# Patient Record
Sex: Female | Born: 1985
Health system: Southern US, Community
[De-identification: ages and names within clinical notes are randomized; demographics above are authoritative.]

## PROBLEM LIST (undated history)

## (undated) ENCOUNTER — Inpatient Hospital Stay (HOSPITAL_COMMUNITY): Payer: Self-pay

## (undated) DIAGNOSIS — O09529 Supervision of elderly multigravida, unspecified trimester: Secondary | ICD-10-CM

## (undated) DIAGNOSIS — F329 Major depressive disorder, single episode, unspecified: Secondary | ICD-10-CM

## (undated) DIAGNOSIS — Z973 Presence of spectacles and contact lenses: Secondary | ICD-10-CM

## (undated) DIAGNOSIS — J302 Other seasonal allergic rhinitis: Secondary | ICD-10-CM

## (undated) DIAGNOSIS — N883 Incompetence of cervix uteri: Secondary | ICD-10-CM

## (undated) DIAGNOSIS — J45990 Exercise induced bronchospasm: Secondary | ICD-10-CM

## (undated) DIAGNOSIS — Z87448 Personal history of other diseases of urinary system: Secondary | ICD-10-CM

## (undated) DIAGNOSIS — J45909 Unspecified asthma, uncomplicated: Secondary | ICD-10-CM

## (undated) DIAGNOSIS — E282 Polycystic ovarian syndrome: Secondary | ICD-10-CM

## (undated) DIAGNOSIS — N390 Urinary tract infection, site not specified: Secondary | ICD-10-CM

## (undated) DIAGNOSIS — N10 Acute tubulo-interstitial nephritis: Secondary | ICD-10-CM

## (undated) DIAGNOSIS — F419 Anxiety disorder, unspecified: Secondary | ICD-10-CM

## (undated) DIAGNOSIS — K429 Umbilical hernia without obstruction or gangrene: Secondary | ICD-10-CM

## (undated) DIAGNOSIS — F32A Depression, unspecified: Secondary | ICD-10-CM

## (undated) HISTORY — PX: WISDOM TOOTH EXTRACTION: SHX21

## (undated) HISTORY — DX: Anxiety disorder, unspecified: F41.9

---

## 2011-04-14 ENCOUNTER — Emergency Department (HOSPITAL_COMMUNITY)
Admission: EM | Admit: 2011-04-14 | Discharge: 2011-04-14 | Disposition: A | Discharge status: Home / Self Care | Payer: 59 | Source: Home / Self Care | Attending: Emergency Medicine | Admitting: Emergency Medicine

## 2011-04-14 DIAGNOSIS — N390 Urinary tract infection, site not specified: Secondary | ICD-10-CM

## 2011-04-14 HISTORY — DX: Acute pyelonephritis: N10

## 2011-04-14 HISTORY — DX: Urinary tract infection, site not specified: N39.0

## 2011-04-14 LAB — POCT URINALYSIS DIP (DEVICE)
Bilirubin Urine: NEGATIVE
Hgb urine dipstick: NEGATIVE
Nitrite: NEGATIVE
pH: 6.5 (ref 5.0–8.0)

## 2011-04-14 MED ORDER — PHENAZOPYRIDINE HCL 200 MG PO TABS: 200.0000 mg | ORAL_TABLET | Freq: Three times a day (TID) | ORAL | Status: AC | PRN

## 2011-04-14 MED ORDER — SULFAMETHOXAZOLE-TRIMETHOPRIM 800-160 MG PO TABS: 1.0000 | ORAL_TABLET | Freq: Two times a day (BID) | ORAL | Status: AC

## 2011-04-14 NOTE — ED Notes (Signed)
Patient denies vaginal discharge, odor or irritation

## 2011-04-14 NOTE — ED Provider Notes (Signed)
History     CSN: [id number] Arrival date & time: 04/14/2011 11:52 AM   First MD Initiated Contact with Patient 04/14/11 1113      Chief Complaint  Patient presents with  . Urinary Frequency  . Urinary Retention  . Dysuria   HPI Comments: Pt with 3 weeks of urinary urgency, frequency, dysuria. No oderous urine, hematuria,  genital blisters, vaginal itching. No fevers, N/V, abd pain, back pain. No recent abx use. Pt sexually active with same female partner who is asxatic. does not use protection. STD's not a concern today. Similar sx before when had UTI.  No h/o syphilis, herpes, HIV, BV/ gonorrhea/chlamydia/trichmonoas.   Patient is a 25 y.o. female presenting with frequency and dysuria. The history is provided by the patient.  Urinary Frequency This is a recurrent problem. The current episode started more than 1 week ago. The problem has not changed since onset.Pertinent negatives include no abdominal pain. The symptoms are aggravated by nothing. The symptoms are relieved by nothing. Treatments tried: drinking extra fluids. The treatment provided mild relief.  Dysuria  Associated symptoms include frequency and urgency. Pertinent negatives include no nausea, no vomiting and no hematuria.    Past Medical History  Diagnosis Date  . UTI (urinary tract infection)     last one was in 2007  . Pyelonephritis, acute     History reviewed. No pertinent past surgical history.  Family History  Problem Relation Age of Onset  . Diabetes Mother   . Diabetes Other     History  Substance Use Topics  . Smoking status: Never Smoker   . Smokeless tobacco: Never Used  . Alcohol Use: Yes     social    OB History    Grav Para Term Preterm Abortions TAB SAB Ect Mult Living                  Review of Systems  Constitutional: Negative for fever.  Gastrointestinal: Negative for nausea, vomiting and abdominal pain.  Genitourinary: Positive for dysuria, urgency and frequency. Negative for  hematuria, vaginal bleeding, vaginal discharge, difficulty urinating, genital sores, vaginal pain and pelvic pain.  Musculoskeletal: Negative for back pain.  Skin: Negative for rash.    Allergies  Tree extract  Home Medications   Current Outpatient Rx  Name Route Sig Dispense Refill  . CETIRIZINE HCL 10 MG PO TABS Oral Take 10 mg by mouth daily.      Marland Kitchen VITAMIN D3 50000 UNITS PO CAPS Oral Take by mouth 1 day or 1 dose.      Marland Kitchen DESOGESTREL-ETHINYL ESTRADIOL 0.15-30 MG-MCG PO TABS Oral Take 1 tablet by mouth daily.      . MULTI-VITAMIN DAILY PO Oral Take by mouth 1 day or 1 dose.      Marland Kitchen PHENAZOPYRIDINE HCL 200 MG PO TABS Oral Take 1 tablet (200 mg total) by mouth 3 (three) times daily as needed for pain. 6 tablet 0  . SULFAMETHOXAZOLE-TRIMETHOPRIM 800-160 MG PO TABS Oral Take 1 tablet by mouth 2 (two) times daily. 6 tablet 0    BP 126/76  Pulse 62  Temp(Src) 98.8 F (37.1 C) (Oral)  Resp 14  SpO2 100%  LMP 02/06/2011  Physical Exam  Nursing note and vitals reviewed. Constitutional: She is oriented to person, place, and time. She appears well-developed and well-nourished. No distress.  HENT:  Head: Normocephalic and atraumatic.  Eyes: EOM are normal. Pupils are equal, round, and reactive to light.  Neck: Normal range of motion.  Cardiovascular: Regular rhythm.   Pulmonary/Chest: Effort normal and breath sounds normal.  Abdominal: Soft. Bowel sounds are normal. She exhibits no distension and no mass. There is no tenderness. There is no rebound and no guarding.       No cva tenderness  Genitourinary:       Pt declined pelvic  Musculoskeletal: Normal range of motion.  Neurological: She is alert and oriented to person, place, and time.  Skin: Skin is warm and dry.  Psychiatric: She has a normal mood and affect. Her behavior is normal. Judgment and thought content normal.    ED Course  Procedures (including critical care time)   Labs Reviewed  POCT URINALYSIS DIP (DEVICE)    POCT PREGNANCY, URINE  POCT PREGNANCY, URINE  POCT URINALYSIS DIPSTICK  URINE CULTURE   Results for orders placed during the hospital encounter of 04/14/11  POCT URINALYSIS DIP (DEVICE)      Component Value Range   Glucose, UA NEGATIVE  NEGATIVE (mg/dL)   Bilirubin Urine NEGATIVE  NEGATIVE    Ketones, ur NEGATIVE  NEGATIVE (mg/dL)   Specific Gravity, Urine 1.010  1.005 - 1.030    Hgb urine dipstick NEGATIVE  NEGATIVE    pH 6.5  5.0 - 8.0    Protein, ur NEGATIVE  NEGATIVE (mg/dL)   Urobilinogen, UA 0.2  0.0 - 1.0 (mg/dL)   Nitrite NEGATIVE  NEGATIVE    Leukocytes, UA NEGATIVE  NEGATIVE   POCT PREGNANCY, URINE      Component Value Range   Preg Test, Ur NEGATIVE       1. UTI (lower urinary tract infection)       MDM  udip neg here will send off for cx and tx for uti empirically as has had identical sx with prev uti's. STD's not a concern today per pt who declined pelvic.    Luiz Blare, MD 04/14/11 [phone]

## 2011-04-14 NOTE — ED Notes (Signed)
Patient complains of Dysuria, urinary frequency and voiding in small amounts x 3 weeks;  Pt states the symptoms have not been constant.  Pt states symptoms improve with increased water intake

## 2011-04-16 LAB — URINE CULTURE
Culture  Setup Time: 201211182217
Culture: NO GROWTH

## 2011-07-03 ENCOUNTER — Encounter (HOSPITAL_COMMUNITY): Payer: Self-pay | Admitting: *Deleted

## 2011-07-03 ENCOUNTER — Emergency Department (INDEPENDENT_AMBULATORY_CARE_PROVIDER_SITE_OTHER)
Admission: EM | Admit: 2011-07-03 | Discharge: 2011-07-03 | Disposition: A | Discharge status: Home / Self Care | Payer: 59 | Source: Home / Self Care | Attending: Family Medicine | Admitting: Family Medicine

## 2011-07-03 DIAGNOSIS — J029 Acute pharyngitis, unspecified: Secondary | ICD-10-CM

## 2011-07-03 HISTORY — DX: Other seasonal allergic rhinitis: J30.2

## 2011-07-03 NOTE — ED Notes (Signed)
Pt c/o sore throat onset Monday.  Denies fever.  Throat slightly red and swollen, no exudate noted.  Denies ear pain.

## 2011-07-03 NOTE — ED Provider Notes (Signed)
History     CSN: [id number]  Arrival date & time 07/03/11  0811   First MD Initiated Contact with Patient 07/03/11 [phone]      Chief Complaint  Patient presents with  . Sore Throat    (Consider location/radiation/quality/duration/timing/severity/associated sxs/prior treatment) HPI Comments: Kimberly Delgado presents for evaluation of 2 days of sore throat. She reports onset of symptoms yesterday that worsened through today. She reports some sneezing, but no coughing, and no fever. She states that she just wanted to get checked out to make sure that this was not strep pharyngitis.  Patient is a 26 y.o. female presenting with pharyngitis. The history is provided by the patient.  Sore Throat This is a new problem. The current episode started yesterday. The problem occurs constantly. The problem has not changed since onset.The symptoms are aggravated by swallowing. The symptoms are relieved by nothing.    Past Medical History  Diagnosis Date  . UTI (urinary tract infection)     last one was in 2007  . Pyelonephritis, acute   . Seasonal allergies     Past Surgical History  Procedure Date  . Tonsillectomy     Family History  Problem Relation Age of Onset  . Diabetes Mother   . Diabetes Other     History  Substance Use Topics  . Smoking status: Never Smoker   . Smokeless tobacco: Never Used  . Alcohol Use: Yes     social    OB History    Grav Para Term Preterm Abortions TAB SAB Ect Mult Living                  Review of Systems  Constitutional: Negative.  Negative for fever and chills.  HENT: Positive for sore throat and sneezing.   Eyes: Negative.   Respiratory: Negative.  Negative for cough.   Cardiovascular: Negative.   Gastrointestinal: Negative.   Genitourinary: Negative.   Musculoskeletal: Negative.   Skin: Negative.   Neurological: Negative.     Allergies  Tree extract  Home Medications   Current Outpatient Rx  Name Route Sig Dispense Refill  .  ALBUTEROL SULFATE (2.5 MG/3ML) 0.083% IN NEBU Nebulization Take 2.5 mg by nebulization every 6 (six) hours as needed.    Marland Kitchen CETIRIZINE HCL 10 MG PO TABS Oral Take 10 mg by mouth daily.      Marland Kitchen VITAMIN D3 50000 UNITS PO CAPS Oral Take by mouth 1 day or 1 dose.      Marland Kitchen RECLIPSEN PO Oral Take 1 tablet by mouth 1 day or 1 dose.    . MOMETASONE FUROATE 50 MCG/ACT NA SUSP Nasal Place 2 sprays into the nose daily.    Marland Kitchen SINGULAIR PO Oral Take 1 tablet by mouth 1 day or 1 dose.    . MULTI-VITAMIN DAILY PO Oral Take by mouth 1 day or 1 dose.      Marland Kitchen DESOGESTREL-ETHINYL ESTRADIOL 0.15-30 MG-MCG PO TABS Oral Take 1 tablet by mouth daily.       BP 105/76  Pulse 88  Temp(Src) 98.6 F (37 C) (Oral)  Resp 14  SpO2 98%  LMP 06/24/2011  Physical Exam  Constitutional: She is oriented to person, place, and time. She appears well-developed and well-nourished.  HENT:  Head: Normocephalic and atraumatic.  Right Ear: Tympanic membrane and external ear normal.  Left Ear: Tympanic membrane and external ear normal.  Nose: Nose normal.  Mouth/Throat: Uvula is midline, oropharynx is clear and moist and mucous membranes are normal.  No oropharyngeal exudate, posterior oropharyngeal edema or posterior oropharyngeal erythema.  Eyes: EOM are normal. Pupils are equal, round, and reactive to light.  Neck: Normal range of motion.  Cardiovascular: Normal rate and regular rhythm.   Pulmonary/Chest: Effort normal and breath sounds normal.  Lymphadenopathy:    She has cervical adenopathy.       Right cervical: Superficial cervical and posterior cervical adenopathy present.       Left cervical: Superficial cervical and posterior cervical adenopathy present.  Neurological: She is alert and oriented to person, place, and time.  Skin: Skin is warm and dry.    ED Course  Procedures (including critical care time)  Labs Reviewed - No data to display No results found.   1. Pharyngitis       MDM  Supportive care with  ibuprofen and acetaminophen        Richardo Priest, MD 07/03/11 [phone]

## 2011-09-13 ENCOUNTER — Encounter (HOSPITAL_COMMUNITY): Payer: Self-pay | Admitting: Emergency Medicine

## 2011-09-13 ENCOUNTER — Emergency Department (HOSPITAL_COMMUNITY)
Admission: EM | Admit: 2011-09-13 | Discharge: 2011-09-13 | Disposition: A | Discharge status: Home / Self Care | Payer: 59 | Attending: Emergency Medicine | Admitting: Emergency Medicine

## 2011-09-13 DIAGNOSIS — R300 Dysuria: Secondary | ICD-10-CM | POA: Insufficient documentation

## 2011-09-13 DIAGNOSIS — N390 Urinary tract infection, site not specified: Secondary | ICD-10-CM | POA: Insufficient documentation

## 2011-09-13 LAB — URINALYSIS, ROUTINE W REFLEX MICROSCOPIC
Bilirubin Urine: NEGATIVE
Ketones, ur: NEGATIVE mg/dL
Nitrite: NEGATIVE
Urobilinogen, UA: 0.2 mg/dL (ref 0.0–1.0)

## 2011-09-13 LAB — PREGNANCY, URINE: Preg Test, Ur: NEGATIVE

## 2011-09-13 MED ORDER — PHENAZOPYRIDINE HCL 200 MG PO TABS: 200.0000 mg | ORAL_TABLET | Freq: Three times a day (TID) | ORAL | Status: AC

## 2011-09-13 MED ORDER — HYDROCODONE-ACETAMINOPHEN 5-325 MG PO TABS: 1.0000 | ORAL_TABLET | ORAL | Status: AC | PRN

## 2011-09-13 MED ORDER — NITROFURANTOIN MONOHYD MACRO 100 MG PO CAPS: 100.0000 mg | ORAL_CAPSULE | Freq: Two times a day (BID) | ORAL | Status: AC

## 2011-09-13 NOTE — ED Notes (Signed)
Pt presented to the ER with c/o uretral pain and burning, pt reports that sharp pain started yesterday and this night was woken up by pain and when went to urinate blood noted. Pt also reports Hx of UTI's since October.

## 2011-09-13 NOTE — ED Provider Notes (Signed)
History     CSN: [id number]  Arrival date & time 09/13/11  0106   First MD Initiated Contact with Patient 09/13/11 0255      Chief Complaint  Patient presents with  . Urinary Tract Infection    HPI: Patient is a 26 y.o. female presenting with urinary tract infection. The history is provided by the patient.  Urinary Tract Infection This is a recurrent problem. The current episode started in the past 7 days. The problem occurs intermittently. The problem has been rapidly worsening. Pertinent negatives include no chills, fever, nausea or vomiting. She has tried nothing for the symptoms.  Pt reports onset of dysuria this past Wed that has worsened. Pt has been seeing urology since Nov for persistent dysuria but states they are never able to find an infection and have been unsure what has been causing her dysuria. States symptoms often occur after intercourse but not always. Tonight was awakened by sharp pains in her urethra and had severe pain when she attempted to void. Denies vag d/c or other GYN c/o's and states she has an appointment w/ her PCP 09/19/2011 for PAP and annual exam.   Past Medical History  Diagnosis Date  . UTI (urinary tract infection)     last one was in 2007  . Pyelonephritis, acute   . Seasonal allergies     Past Surgical History  Procedure Date  . Tonsillectomy     Family History  Problem Relation Age of Onset  . Diabetes Mother   . Diabetes Other     History  Substance Use Topics  . Smoking status: Never Smoker   . Smokeless tobacco: Never Used  . Alcohol Use: Yes     social    OB History    Grav Para Term Preterm Abortions TAB SAB Ect Mult Living                  Review of Systems  Constitutional: Negative.  Negative for fever and chills.  HENT: Negative.   Eyes: Negative.   Respiratory: Negative.   Cardiovascular: Negative.   Gastrointestinal: Negative.  Negative for nausea and vomiting.  Genitourinary: Positive for dysuria and  frequency. Negative for flank pain, vaginal discharge, difficulty urinating, vaginal pain and dyspareunia.  Musculoskeletal: Negative.   Neurological: Negative.   Hematological: Negative.   Psychiatric/Behavioral: Negative.     Allergies  Tree extract  Home Medications   Current Outpatient Rx  Name Route Sig Dispense Refill  . AZELASTINE-FLUTICASONE 137-50 MCG/ACT NA SUSP Nasal Place 1 spray into the nose daily.    Marland Kitchen CRANBERRY 400 MG PO CAPS Oral Take 400 mg by mouth daily.    Marland Kitchen DARIFENACIN HYDROBROMIDE ER 15 MG PO TB24 Oral Take 15 mg by mouth daily.    Marland Kitchen RECLIPSEN PO Oral Take 1 tablet by mouth 1 day or 1 dose.    Marland Kitchen MONTELUKAST SODIUM 10 MG PO TABS Oral Take 10 mg by mouth at bedtime.    . ADULT MULTIVITAMIN W/MINERALS CH Oral Take 1 tablet by mouth daily.      BP 109/68  Pulse 65  Temp(Src) 98.5 F (36.9 C) (Oral)  Resp 20  SpO2 100%  LMP 09/04/2011  Physical Exam  Constitutional: She is oriented to person, place, and time. She appears well-developed and well-nourished.  HENT:  Head: Normocephalic and atraumatic.  Eyes: Conjunctivae are normal.  Neck: Normal range of motion.  Cardiovascular: Normal rate.   Pulmonary/Chest: Effort normal.  Musculoskeletal: Normal range of  motion.  Neurological: She is alert and oriented to person, place, and time.  Skin: Skin is warm and dry.  Psychiatric: She has a normal mood and affect.    ED Course  Procedures (including critical care time)  Labs Reviewed  URINALYSIS, ROUTINE W REFLEX MICROSCOPIC - Abnormal; Notable for the following:    APPearance CLOUDY (*)    Hgb urine dipstick LARGE (*)    Protein, ur 100 (*)    Leukocytes, UA LARGE (*)    All other components within normal limits  URINE MICROSCOPIC-ADD ON - Abnormal; Notable for the following:    Bacteria, UA FEW (*)    All other components within normal limits   No results found.   No diagnosis found.    MDM  HPI/PE and clinical findings c/w 1. UTI  (Denies vag d/c or other GYN c/o's. Scheduled for annual GYN exam next week. Encouraged to also f/u w/ her urologist as well).        Roma Kayser Nycole Kawahara, NP 09/13/11 [phone]

## 2011-09-13 NOTE — Discharge Instructions (Signed)
Please read over the instructions below. We are treating you for a urinary tract infection (UTI). Take the antibiotic as directed and be sure to complete. Take the pyridium as directed for pain  with urination and the narcotic pain medication for more severe pain. Keep scheduled appointment with Dr Parke Simmers next week for your annual GYN screening exam and be sure she is aware that you have recently been treated for a UTI as she will want to recheck your urine to assure the infection has cleared.     Urinary Tract Infection A urinary tract infection (UTI) is often caused by a germ (bacteria). A UTI is usually helped with medicine (antibiotics) that kills germs. Take all the medicine until it is gone. Do this even if you are feeling better. You are usually better in 7 to 10 days. HOME CARE   Drink enough water and fluids to keep your pee (urine) clear or pale yellow. Drink:   Cranberry juice.   Water.   Avoid:   Caffeine.   Tea.   Bubbly (carbonated) drinks.   Alcohol.   Only take medicine as told by your doctor.   To prevent further infections:   Pee often.   After pooping (bowel movement), women should wipe from front to back. Use each tissue only once.   Pee before and after having sex (intercourse).  Ask your doctor when your test results will be ready. Make sure you follow up and get your test results.  GET HELP RIGHT AWAY IF:   There is very bad back pain or lower belly (abdominal) pain.   You get the chills.   You have a fever.   Your baby is older than 3 months with a rectal temperature of 102 F (38.9 C) or higher.   Your baby is 71 months old or younger with a rectal temperature of 100.4 F (38 C) or higher.   You feel sick to your stomach (nauseous) or throw up (vomit).   There is continued burning with peeing.   Your problems are not better in 3 days. Return sooner if you are getting worse.  MAKE SURE YOU:   Understand these instructions.   Will watch  your condition.   Will get help right away if you are not doing well or get worse.  Document Released: 10/30/2007 Document Revised: 05/02/2011 Document Reviewed: 10/30/2007 [facility] Patient Information 2012 [place], [place].

## 2011-09-13 NOTE — ED Notes (Signed)
PA Schorr at bedside. 

## 2011-09-13 NOTE — ED Provider Notes (Signed)
Medical screening examination/treatment/procedure(s) were performed by non-physician practitioner and as supervising physician I was immediately available for consultation/collaboration.  Olivia Mackie, MD 09/13/11 [phone]

## 2011-09-16 ENCOUNTER — Emergency Department (HOSPITAL_COMMUNITY)
Admission: EM | Admit: 2011-09-16 | Discharge: 2011-09-16 | Disposition: A | Discharge status: Home / Self Care | Payer: 59 | Source: Home / Self Care | Attending: Family Medicine | Admitting: Family Medicine

## 2011-09-16 ENCOUNTER — Encounter (HOSPITAL_COMMUNITY): Payer: Self-pay

## 2011-09-16 DIAGNOSIS — N390 Urinary tract infection, site not specified: Secondary | ICD-10-CM

## 2011-09-16 LAB — POCT URINALYSIS DIP (DEVICE)
Hgb urine dipstick: NEGATIVE
Ketones, ur: NEGATIVE mg/dL
Protein, ur: NEGATIVE mg/dL
Specific Gravity, Urine: 1.02 (ref 1.005–1.030)
pH: 6.5 (ref 5.0–8.0)

## 2011-09-16 LAB — POCT PREGNANCY, URINE: Preg Test, Ur: NEGATIVE

## 2011-09-16 NOTE — Discharge Instructions (Signed)
Finish your antibiotic and see your urologist as planned.

## 2011-09-16 NOTE — ED Provider Notes (Addendum)
History     CSN: [id number]  Arrival date & time 09/16/11  1947   First MD Initiated Contact with Patient 09/16/11 1956      Chief Complaint  Patient presents with  . Urinary Tract Infection    (Consider location/radiation/quality/duration/timing/severity/associated sxs/prior treatment) Patient is a 26 y.o. female presenting with frequency. The history is provided by the patient and a friend.  Urinary Frequency This is a chronic problem. The current episode started more than 2 days ago. The problem has not changed (pt c/o bilat back pain persistent from ER visit 4/19, no fever n/v  or chills., on macrobid and vicodin from ER but feels not improving.) since onset.   Past Medical History  Diagnosis Date  . UTI (urinary tract infection)     last one was in 2007  . Pyelonephritis, acute   . Seasonal allergies   . Overactive bladder     Past Surgical History  Procedure Date  . Tonsillectomy     Family History  Problem Relation Age of Onset  . Diabetes Mother   . Diabetes Other     History  Substance Use Topics  . Smoking status: Never Smoker   . Smokeless tobacco: Never Used  . Alcohol Use: Yes     social    OB History    Grav Para Term Preterm Abortions TAB SAB Ect Mult Living                  Review of Systems  Constitutional: Negative for fever and chills.  Gastrointestinal: Negative.   Genitourinary: Positive for frequency.  Musculoskeletal: Positive for back pain.    Allergies  Tree extract  Home Medications   Current Outpatient Rx  Name Route Sig Dispense Refill  . AZELASTINE-FLUTICASONE 137-50 MCG/ACT NA SUSP Nasal Place 1 spray into the nose daily.    Marland Kitchen DARIFENACIN HYDROBROMIDE ER 15 MG PO TB24 Oral Take 15 mg by mouth daily.    Marland Kitchen RECLIPSEN PO Oral Take 1 tablet by mouth 1 day or 1 dose.    Marland Kitchen HYDROCODONE-ACETAMINOPHEN 5-325 MG PO TABS Oral Take 1 tablet by mouth every 4 (four) hours as needed for pain. 10 tablet 0  . MONTELUKAST SODIUM 10 MG  PO TABS Oral Take 10 mg by mouth at bedtime.    Marland Kitchen NITROFURANTOIN MONOHYD MACRO 100 MG PO CAPS Oral Take 1 capsule (100 mg total) by mouth 2 (two) times daily. 14 capsule 0  . PHENAZOPYRIDINE HCL 200 MG PO TABS Oral Take 1 tablet (200 mg total) by mouth 3 (three) times daily. 6 tablet 0  . CRANBERRY 400 MG PO CAPS Oral Take 400 mg by mouth daily.    . ADULT MULTIVITAMIN W/MINERALS CH Oral Take 1 tablet by mouth daily.      BP 113/64  Pulse 71  Temp(Src) 97.9 F (36.6 C) (Oral)  Resp 16  SpO2 97%  LMP 09/04/2011  Physical Exam  Nursing note and vitals reviewed. Constitutional: She is oriented to person, place, and time. She appears well-developed and well-nourished.  Abdominal: Soft. Bowel sounds are normal. There is no CVA tenderness.  Musculoskeletal:       Lumbar back: She exhibits tenderness.  Neurological: She is alert and oriented to person, place, and time.  Skin: Skin is warm and dry.    ED Course  Procedures (including critical care time)   Labs Reviewed  POCT URINALYSIS DIP (DEVICE)  POCT PREGNANCY, URINE  URINE CULTURE   No results found.  1. UTI (lower urinary tract infection)       MDM  Pt and friend became argumentative and unreceptive to discussion that u/a was clear , indicating that infection was resolving, , pt obviously frustrated by problem present since oct and no one can figure out why she is hurting, my back still hurts and I think I still have infection in my kidneys. Pt advised that we would get a urine culture and that otherwise we were limited in doing anything else to eval the problem and she needs to see her urologist, pt unsatisfied with visit and not reassured.        Linna Hoff, MD 09/16/11 2117  Linna Hoff, MD 09/18/11 2108

## 2011-09-16 NOTE — ED Notes (Signed)
Pt seen in ED on Thursday and started on antibiotics for UTI.  Here tonight because she continues to have low back pain, states the urinary frequency has improved.  Pt has hx of urinary problems and sees a urologist; is being treated for overactive bladder.

## 2011-09-17 LAB — URINE CULTURE
Colony Count: NO GROWTH
Culture  Setup Time: 201304222238

## 2011-09-19 ENCOUNTER — Other Ambulatory Visit (HOSPITAL_COMMUNITY)
Admission: RE | Admit: 2011-09-19 | Discharge: 2011-09-19 | Disposition: A | Discharge status: Home / Self Care | Payer: 59 | Source: Ambulatory Visit | Attending: Family Medicine | Admitting: Family Medicine

## 2011-09-19 ENCOUNTER — Other Ambulatory Visit: Payer: Self-pay | Admitting: Family Medicine

## 2011-09-19 DIAGNOSIS — Z01419 Encounter for gynecological examination (general) (routine) without abnormal findings: Secondary | ICD-10-CM | POA: Insufficient documentation

## 2012-02-29 ENCOUNTER — Ambulatory Visit (INDEPENDENT_AMBULATORY_CARE_PROVIDER_SITE_OTHER): Payer: 59 | Admitting: Family Medicine

## 2012-02-29 VITALS — BP 105/72 | HR 80 | Temp 98.2°F | Resp 16 | Ht 67.0 in | Wt 134.0 lb

## 2012-02-29 DIAGNOSIS — N390 Urinary tract infection, site not specified: Secondary | ICD-10-CM

## 2012-02-29 DIAGNOSIS — R300 Dysuria: Secondary | ICD-10-CM

## 2012-02-29 LAB — POCT UA - MICROSCOPIC ONLY
Casts, Ur, LPF, POC: NEGATIVE
Crystals, Ur, HPF, POC: NEGATIVE
Epithelial cells, urine per micros: NEGATIVE
Mucus, UA: NEGATIVE
Yeast, UA: NEGATIVE

## 2012-02-29 LAB — POCT URINALYSIS DIPSTICK
Bilirubin, UA: NEGATIVE
Glucose, UA: NEGATIVE
Ketones, UA: NEGATIVE
Nitrite, UA: NEGATIVE
Spec Grav, UA: 1.01
Urobilinogen, UA: 0.2
pH, UA: 6

## 2012-02-29 MED ORDER — CIPROFLOXACIN HCL 500 MG PO TABS: 500.0000 mg | ORAL_TABLET | Freq: Two times a day (BID) | ORAL | Status: DC

## 2012-02-29 NOTE — Patient Instructions (Signed)
Urinary Tract Infection Urinary tract infections (UTIs) can develop anywhere along your urinary tract. Your urinary tract is your body's drainage system for removing wastes and extra water. Your urinary tract includes two kidneys, two ureters, a bladder, and a urethra. Your kidneys are a pair of bean-shaped organs. Each kidney is about the size of your fist. They are located below your ribs, one on each side of your spine. CAUSES Infections are caused by microbes, which are microscopic organisms, including fungi, viruses, and bacteria. These organisms are so small that they can only be seen through a microscope. Bacteria are the microbes that most commonly cause UTIs. SYMPTOMS  Symptoms of UTIs may vary by age and gender of the patient and by the location of the infection. Symptoms in young women typically include a frequent and intense urge to urinate and a painful, burning feeling in the bladder or urethra during urination. Older women and men are more likely to be tired, shaky, and weak and have muscle aches and abdominal pain. A fever may mean the infection is in your kidneys. Other symptoms of a kidney infection include pain in your back or sides below the ribs, nausea, and vomiting. DIAGNOSIS To diagnose a UTI, your caregiver will ask you about your symptoms. Your caregiver also will ask to provide a urine sample. The urine sample will be tested for bacteria and white blood cells. White blood cells are made by your body to help fight infection. TREATMENT  Typically, UTIs can be treated with medication. Because most UTIs are caused by a bacterial infection, they usually can be treated with the use of antibiotics. The choice of antibiotic and length of treatment depend on your symptoms and the type of bacteria causing your infection. HOME CARE INSTRUCTIONS  If you were prescribed antibiotics, take them exactly as your caregiver instructs you. Finish the medication even if you feel better after you  have only taken some of the medication.  Drink enough water and fluids to keep your urine clear or pale yellow.  Avoid caffeine, tea, and carbonated beverages. They tend to irritate your bladder.  Empty your bladder often. Avoid holding urine for long periods of time.  Empty your bladder before and after sexual intercourse.  After a bowel movement, women should cleanse from front to back. Use each tissue only once. SEEK MEDICAL CARE IF:   You have back pain.  You develop a fever.  Your symptoms do not begin to resolve within 3 days. SEEK IMMEDIATE MEDICAL CARE IF:   You have severe back pain or lower abdominal pain.  You develop chills.  You have nausea or vomiting.  You have continued burning or discomfort with urination. MAKE SURE YOU:   Understand these instructions.  Will watch your condition.  Will get help right away if you are not doing well or get worse. Document Released: 02/20/2005 Document Revised: 11/12/2011 Document Reviewed: 06/21/2011 [employer] Patient Information 2013 [employer].  

## 2012-02-29 NOTE — Progress Notes (Signed)
26 yo with acute onset of UTI this morning:  Dysuria and hematuria.  She gets UTI's more than twice a year Has had urology evaluation  Objective:  NAD No CVAT  Results for orders placed during the hospital encounter of 09/16/11  POCT URINALYSIS DIP (DEVICE)      Component Value Range   Glucose, UA NEGATIVE  NEGATIVE mg/dL   Bilirubin Urine NEGATIVE  NEGATIVE   Ketones, ur NEGATIVE  NEGATIVE mg/dL   Specific Gravity, Urine 1.020  1.005 - 1.030   Hgb urine dipstick NEGATIVE  NEGATIVE   pH 6.5  5.0 - 8.0   Protein, ur NEGATIVE  NEGATIVE mg/dL   Urobilinogen, UA 0.2  0.0 - 1.0 mg/dL   Nitrite NEGATIVE  NEGATIVE   Leukocytes, UA NEGATIVE  NEGATIVE  POCT PREGNANCY, URINE      Component Value Range   Preg Test, Ur NEGATIVE  NEGATIVE  URINE CULTURE      Component Value Range   Specimen Description URINE, CLEAN CATCH     Special Requests NONE     Culture  Setup Time [id number]     Colony Count NO GROWTH     Culture NO GROWTH     Report Status 09/17/2011 FINAL     Results for orders placed in visit on 02/29/12  POCT URINALYSIS DIPSTICK      Component Value Range   Color, UA yellow     Clarity, UA cloudy     Glucose, UA neg     Bilirubin, UA neg     Ketones, UA neg     Spec Grav, UA 1.010     Blood, UA large     pH, UA 6.0     Protein, UA 30mg      Urobilinogen, UA 0.2     Nitrite, UA neg     Leukocytes, UA large (3+)     Assessment: 1. Dysuria  POCT urinalysis dipstick, POCT UA - Microscopic Only   I've given patient refills on the cipro because of the recurrent nature of UTI and recent marriage.

## 2012-03-05 NOTE — Progress Notes (Signed)
Patient with obvious findings of UTI needs to complete a course of antibiotics and return if symptoms persist or recur

## 2012-05-27 NOTE — L&D Delivery Note (Signed)
Delivery Note At 9:56 AM a viable and healthy female was delivered via Vaginal, Spontaneous Delivery (Presentation ROA, compound  ;  ).  APGAR6/6: , ; weight . 1lb 6.6 oz  Placenta status: Intact, Spontaneous, short cord Pathology.   Cord: 3 vessels with the following complications: Short.  Cord pH: none  Anesthesia: None  Episiotomy: None Lacerations:  none Suture Repair: none Est. Blood Loss (mL):   Mom to postpartum.  Baby to NICU.  Kimberly Delgado A 03/02/2013, 10:15 AM

## 2013-02-26 ENCOUNTER — Inpatient Hospital Stay (HOSPITAL_COMMUNITY)
Admission: AD | Admit: 2013-02-26 | Discharge: 2013-03-04 | DRG: 775 | Disposition: A | Discharge status: Home / Self Care | Payer: 59 | Source: Ambulatory Visit | Attending: Obstetrics and Gynecology | Admitting: Obstetrics and Gynecology

## 2013-02-26 ENCOUNTER — Encounter (HOSPITAL_COMMUNITY): Payer: Self-pay | Admitting: *Deleted

## 2013-02-26 ENCOUNTER — Ambulatory Visit (HOSPITAL_COMMUNITY)
Admit: 2013-02-26 | Discharge: 2013-02-26 | Disposition: A | Discharge status: Home / Self Care | Payer: 59 | Attending: Obstetrics and Gynecology | Admitting: Obstetrics and Gynecology

## 2013-02-26 DIAGNOSIS — O26879 Cervical shortening, unspecified trimester: Secondary | ICD-10-CM | POA: Diagnosis present

## 2013-02-26 DIAGNOSIS — O3430 Maternal care for cervical incompetence, unspecified trimester: Principal | ICD-10-CM | POA: Diagnosis present

## 2013-02-26 DIAGNOSIS — O4700 False labor before 37 completed weeks of gestation, unspecified trimester: Secondary | ICD-10-CM | POA: Diagnosis present

## 2013-02-26 DIAGNOSIS — O9903 Anemia complicating the puerperium: Secondary | ICD-10-CM | POA: Diagnosis not present

## 2013-02-26 DIAGNOSIS — D62 Acute posthemorrhagic anemia: Secondary | ICD-10-CM | POA: Diagnosis not present

## 2013-02-26 DIAGNOSIS — O360990 Maternal care for other rhesus isoimmunization, unspecified trimester, not applicable or unspecified: Secondary | ICD-10-CM | POA: Diagnosis present

## 2013-02-26 DIAGNOSIS — O3433 Maternal care for cervical incompetence, third trimester: Secondary | ICD-10-CM

## 2013-02-26 DIAGNOSIS — N883 Incompetence of cervix uteri: Secondary | ICD-10-CM | POA: Diagnosis present

## 2013-02-26 HISTORY — DX: Unspecified asthma, uncomplicated: J45.909

## 2013-02-26 LAB — URINALYSIS, ROUTINE W REFLEX MICROSCOPIC
Ketones, ur: NEGATIVE mg/dL
Nitrite: NEGATIVE
Protein, ur: NEGATIVE mg/dL
Urobilinogen, UA: 0.2 mg/dL (ref 0.0–1.0)

## 2013-02-26 LAB — OB RESULTS CONSOLE RUBELLA ANTIBODY, IGM: Rubella: IMMUNE

## 2013-02-26 LAB — OB RESULTS CONSOLE RPR: RPR: NONREACTIVE

## 2013-02-26 LAB — CBC
MCH: 29.7 pg (ref 26.0–34.0)
MCHC: 35.1 g/dL (ref 30.0–36.0)
Platelets: 233 10*3/uL (ref 150–400)
RBC: 4.04 MIL/uL (ref 3.87–5.11)
RDW: 13.5 % (ref 11.5–15.5)

## 2013-02-26 LAB — OB RESULTS CONSOLE HEPATITIS B SURFACE ANTIGEN: Hepatitis B Surface Ag: NEGATIVE

## 2013-02-26 LAB — OB RESULTS CONSOLE ANTIBODY SCREEN: Antibody Screen: NEGATIVE

## 2013-02-26 MED ORDER — INDOMETHACIN 50 MG RE SUPP
50.0000 mg | Freq: Once | RECTAL | Status: AC
  Administered 2013-02-26: 50 mg via RECTAL
  Filled 2013-02-26: qty 1

## 2013-02-26 MED ORDER — ZOLPIDEM TARTRATE 5 MG PO TABS
5.0000 mg | ORAL_TABLET | Freq: Every evening | ORAL | Status: DC | PRN
  Administered 2013-02-27 – 2013-03-02 (×4): 5 mg via ORAL
  Filled 2013-02-26 (×4): qty 1

## 2013-02-26 MED ORDER — MAGNESIUM SULFATE BOLUS VIA INFUSION
4.0000 g | Freq: Once | INTRAVENOUS | Status: AC
  Administered 2013-02-26: 4 g via INTRAVENOUS
  Filled 2013-02-26: qty 500

## 2013-02-26 MED ORDER — PHENAZOPYRIDINE HCL 100 MG PO TABS
100.0000 mg | ORAL_TABLET | Freq: Three times a day (TID) | ORAL | Status: DC
  Administered 2013-02-26 – 2013-02-28 (×6): 100 mg via ORAL
  Filled 2013-02-26 (×10): qty 1

## 2013-02-26 MED ORDER — INDOMETHACIN 25 MG PO CAPS
25.0000 mg | ORAL_CAPSULE | Freq: Four times a day (QID) | ORAL | Status: DC
  Administered 2013-02-26 – 2013-02-28 (×8): 25 mg via ORAL
  Filled 2013-02-26 (×12): qty 1

## 2013-02-26 MED ORDER — PENICILLIN G POTASSIUM 5000000 UNITS IJ SOLR
2.5000 10*6.[IU] | INTRAMUSCULAR | Status: DC
  Administered 2013-02-26 – 2013-02-28 (×10): 2.5 10*6.[IU] via INTRAVENOUS
  Filled 2013-02-26 (×14): qty 2.5

## 2013-02-26 MED ORDER — TERBUTALINE SULFATE 1 MG/ML IJ SOLN
0.2500 mg | Freq: Once | INTRAMUSCULAR | Status: AC
  Administered 2013-02-26: 0.25 mg via SUBCUTANEOUS

## 2013-02-26 MED ORDER — MAGNESIUM SULFATE 40 G IN LACTATED RINGERS - SIMPLE
2.0000 g/h | INTRAVENOUS | Status: DC
  Administered 2013-02-27: 2 g/h via INTRAVENOUS
  Filled 2013-02-26 (×2): qty 500

## 2013-02-26 MED ORDER — CALCIUM CARBONATE ANTACID 500 MG PO CHEW: 2.0000 | CHEWABLE_TABLET | ORAL | Status: DC | PRN

## 2013-02-26 MED ORDER — TERBUTALINE SULFATE 1 MG/ML IJ SOLN
INTRAMUSCULAR | Status: AC
  Filled 2013-02-26: qty 1

## 2013-02-26 MED ORDER — ACETAMINOPHEN 325 MG PO TABS
650.0000 mg | ORAL_TABLET | ORAL | Status: DC | PRN
  Administered 2013-02-26 – 2013-03-01 (×10): 650 mg via ORAL
  Filled 2013-02-26 (×11): qty 2

## 2013-02-26 MED ORDER — BETAMETHASONE SOD PHOS & ACET 6 (3-3) MG/ML IJ SUSP
12.0000 mg | Freq: Once | INTRAMUSCULAR | Status: AC
  Administered 2013-02-26: 12 mg via INTRAMUSCULAR
  Filled 2013-02-26: qty 2

## 2013-02-26 MED ORDER — PRENATAL MULTIVITAMIN CH
1.0000 | ORAL_TABLET | Freq: Every day | ORAL | Status: DC
  Administered 2013-02-26 – 2013-03-01 (×4): 1 via ORAL
  Filled 2013-02-26 (×4): qty 1

## 2013-02-26 MED ORDER — PENICILLIN G POTASSIUM 5000000 UNITS IJ SOLR
5.0000 10*6.[IU] | Freq: Once | INTRAVENOUS | Status: AC
  Administered 2013-02-26: 5 10*6.[IU] via INTRAVENOUS
  Filled 2013-02-26 (×2): qty 5

## 2013-02-26 MED ORDER — BETAMETHASONE SOD PHOS & ACET 6 (3-3) MG/ML IJ SUSP
12.0000 mg | Freq: Once | INTRAMUSCULAR | Status: AC
  Administered 2013-02-27: 12 mg via INTRAMUSCULAR
  Filled 2013-02-26: qty 2

## 2013-02-26 MED ORDER — LACTATED RINGERS IV SOLN
INTRAVENOUS | Status: DC
  Administered 2013-02-26 – 2013-03-01 (×7): via INTRAVENOUS

## 2013-02-26 MED ORDER — DOCUSATE SODIUM 100 MG PO CAPS
100.0000 mg | ORAL_CAPSULE | Freq: Every day | ORAL | Status: DC
  Administered 2013-02-26 – 2013-03-01 (×4): 100 mg via ORAL
  Filled 2013-02-26 (×4): qty 1

## 2013-02-26 NOTE — H&P (Signed)
Kimberly Delgado is a 27 y.o. female  G1P0 MWF @ 77 5/[redacted] weeks gestation presenting for admission due to finding of cervical dilation( 1.2 cm) on sono done for f/u fetal anatomy. Pt denies any pelvic pressure or leakage of fluid. Pt has only had 1st trimester vaginal bleeding otherwise unremarkable pregnancy  History OB History   Grav Para Term Preterm Abortions TAB SAB Ect Mult Living                 Past Medical History  Diagnosis Date  . UTI (urinary tract infection)     last one was in 2007  . Pyelonephritis, acute   . Seasonal allergies   . Overactive bladder    Past Surgical History  Procedure Laterality Date  . Tonsillectomy     Family History: family history includes Diabetes in her mother and other. Social History:  reports that she has never smoked. She has never used smokeless tobacco. She reports that  drinks alcohol. She reports that she does not use illicit drugs.   Prenatal Transfer Tool  Maternal Diabetes: No Genetic Screening: Normal Maternal Ultrasounds/Referrals: Abnormal:  Findings:   Other: dilated cervix Fetal Ultrasounds or other Referrals:  None Maternal Substance Abuse:  No Significant Maternal Medications:  None Significant Maternal Lab Results:  Lab values include: Rh negative Other Comments:  None  ROS neg    There were no vitals taken for this visit. Exam Physical Exam  Constitutional: She is oriented to person, place, and time. She appears well-developed and well-nourished.  HENT:  Head: Atraumatic.  Eyes: EOM are normal.  Neck: Neck supple.  Cardiovascular: Regular rhythm.   Respiratory: Breath sounds normal.  GI: Bowel sounds are normal.  Neurological: She is alert and oriented to person, place, and time.  Skin: Skin is warm and dry.  Psychiatric: She has a normal mood and affect.   SSE only: visual cervix 1 cm dilated uneffaced.  (+) yellow mucoid d/c. Wet prep neg. GBS cx done Prenatal labs: ABO, Rh:  O neg Antibody:   neg Rubella:  Immune RPR:   NR HBsAg:   neg HIV:   NR GBS:   done today  Assessment/Plan: Cervical incompetence Rh negative IUP @ 23 5/7 weeks P) admit cont toco r/o ctx. Ucx. MFM consult. 1hr GCT today. BMZ. NICU consult. Bedrest. I think too advance gestation  Age for cerclage but welcome MFM input. CBC. Magnesium sulfate for CP prophylaxis when appropriate   Kimberly Delgado A 02/26/2013, 9:58 AM

## 2013-02-26 NOTE — Progress Notes (Signed)
MATERNAL FETAL MEDICINE CONSULT  Patient Name: Kimberly Delgado Medical Record Number:  [id number] Date of Birth: 28-Nov-1985 Requesting Physician Name:  Kimberly Kyle, MD Date of Service: 02/26/2013  Chief Complaint Cervical insufficiency  History of Present Illness Kimberly Delgado was seen today secondary to cervical insufficiency at the request of Kimberly Kyle, MD.  The patient is a 27 y.o. G31P0,at [redacted]w[redacted]d with an EDD of 06/20/2013, by Other Basis dating method.  She was seen in Dr. Cherly Hensen' office today for a routine anatomy ultrasound and was found to have severe cervical shortening on ultrasound.  Speculum exam showed her to be visually 1 cm dilated.  She is having no symptoms at this time and has had an uncomplicated pregnancy thus far.  She has no history of cervical surgery or other risk factors for cervical insufficiency.  Review of Systems Pertinent items are noted in HPI.  Patient History OB History  Gravida Para Term Preterm AB SAB TAB Ectopic Multiple Living  1             # Outcome Date GA Lbr Len/2nd Weight Sex Delivery Anes PTL Lv  1 CUR               Past Medical History  Diagnosis Date  . UTI (urinary tract infection)     last one was in 2007  . Pyelonephritis, acute   . Seasonal allergies   . Asthma     Past Surgical History  Procedure Laterality Date  . Tonsillectomy      History   Social History  . Marital Status: Married    Spouse Name: N/A    Number of Children: N/A  . Years of Education: N/A   Social History Main Topics  . Smoking status: Never Smoker   . Smokeless tobacco: Never Used  . Alcohol Use: No     Comment: social  . Drug Use: No  . Sexual Activity: Yes    Birth Control/ Protection: Pill   Other Topics Concern  . Not on file   Social History Narrative  . No narrative on file    Family History  Problem Relation Age of Onset  . Diabetes Mother   . Diabetes Other    In addition, the patient has  no family history of mental retardation, birth defects, or genetic diseases.  Physical Examination There were no vitals filed for this visit. General appearance - alert, well appearing, and in no distress Abdomen - soft, nontender, nondistended, no masses or organomegaly Extremities - no pedal edema noted  Assessment and Recommendations 1.  Cervical insufficiency.  Although I am unable to view the images from [name]'s ultrasound, Dr. Cherly Hensen' account seems very suspicious for cervical insufficiency.  Given her total lack of symptoms her presentation is not consistent with preterm labor.  She has already been started on a betamethasone course which should obviously be completed.  As she is having no symptoms and no uterine activity I do not think there is any role for tocolytics in this situation.  [name] is too far into gestation for a cervical cerclage, but vaginal progesterone may be of some benefit in this situation and should be started immediately.  She is also a good candidate for a pessary, which has been shown to be effective for women with cervical shortening without a history of preterm delivery.  If [name] is stable throughout the weekend and does not deliver a pessary could be placed.  However, placement would  require that she be seen at the [facility] in [place].  Thank you for referring [name] to the [facility].  Please do not hesitate to contact us with questions.   Rema Fendt, MD

## 2013-02-26 NOTE — Progress Notes (Signed)
Chaplain visited pt and pt's mother.  Pt is [employer] employee (Joanna).  Pt stated she has been told to have bed rest the remainder of pregnancy.  Pt inquired about who she needs to contact at [employer] regarding a medical leave from [facility] [employer].  She stated that she has notified her supervisor.  Chaplain stated that step was appropriate and that the supervisor would likely be in touch with Human Resources.  Pt was engaging in conversation and indicated she was grateful for  chaplain's visit.    02/26/13 1300  Clinical Encounter Type  Visited With Patient and family together  Visit Type Spiritual support  Referral From Chaplain    Oley Balm Sherrod

## 2013-02-26 NOTE — Progress Notes (Signed)
Called by RN due to pt contracting but not perceived by pt.    O: VE 2-3/70/-3 with palp membrane( while ctx noted during exam   Abd: gravid mod palp ctx   BMZ x 1 given Tracing: baseline 150  Ctx q 2 mins  GBS cx pending U/a neg,  Cbc wbc 13K Nl 1hr GCT  IMP: PTL IUP @ 23 5/7 weeks Unknown GBS  P) NICU called & updated on status.  Will start magnesium. IV PCN for unknown status. Complete BMZ

## 2013-02-26 NOTE — Progress Notes (Signed)
S: c/o low back pain and pelvic pressure  O: magnesium bolus done  Tracing: baseline 150 Ctx q 2 mins  PTL IUP @ 23 5/7 weeks  P) Indocin added. IV PCN going. Will give [place] terbutaline x 1. Seen by NICU

## 2013-02-26 NOTE — Consult Note (Signed)
[facility]  Neonatal Medicine Consultation       02/26/2013    7:13 PM  I was called at the request of the patient's obstetrician (Dr. Cherly Hensen) to speak to this patient due to premature cervical dilatation at 23 5/7 weeks.  Since admission, she has changed from 1 to 2 cm, and has been having contractions.  She's been started on betamethasone, penicillin, and tocolysis (earlier was on terbutaline, currently on magnesium sulfate and will be on indomethacin).  I talked to her and her husband about babies born this early, reviewing mortality, morbidity (respiratory distress, infection, feeding, intracranial bleeding, retinopathy, and neurodevelopment), and length of stay.  Hopefully she will remain undelivered for many more days.  I spent 20 minutes reviewing the record, speaking to the patient, and entering appropriate documentation.  More than 50% of the time was spent face to face with patient.   _____________________ Electronically Signed By: Angelita Ingles, MD Neonatologist

## 2013-02-27 NOTE — Progress Notes (Signed)
HD #2 23 6/7wk /cervical incompetence/PTL BMZ #1( 10/3) Magnesium PCN  S: notes back sore. Denies pelvic pressure. Had BM yesterday (+) FM  VSS: Afebrile   Lungs clear to A Cor RRR Abd gravid nontender Pelvic 3/70/ vtx ballottable Extr(-) edema  I/0 (+) deficit. GBS cx pending. ucx pending  Tracing: baseline 150 no ctx  IMP: PTL @ 23 6/7 weeks On Magnesium/indocin GBS cx unknown on PCN  P) await cx. 2nd BMZ today. Cont magneisum for 24 hr post last dose of BMZ. Remain inpt. Disc with pt: use of tocolysis( lack of evidence to support) post magnesium

## 2013-02-28 MED ORDER — MAGNESIUM SULFATE 40 G IN LACTATED RINGERS - SIMPLE
2.0000 g/h | INTRAVENOUS | Status: AC
  Filled 2013-02-28: qty 500

## 2013-02-28 MED ORDER — NIFEDIPINE 10 MG PO CAPS
10.0000 mg | ORAL_CAPSULE | Freq: Four times a day (QID) | ORAL | Status: DC
  Administered 2013-02-28 – 2013-03-02 (×7): 10 mg via ORAL
  Filled 2013-02-28 (×7): qty 1

## 2013-02-28 NOTE — Plan of Care (Signed)
Problem: Consults Goal: Birthing Suites Patient Information Press F2 to bring up selections list   Pt < [redacted] weeks EGA     

## 2013-02-28 NOTE — Progress Notes (Signed)
HD #3 24wk cervical incompetence/PTL BMZ complete( 10/3. 10/4) Magnesium PCN  S: feels well denies ctx, vaginal discharge or pelvic pressure  VSS: Afebrile   Lungs clear to A Cor RRR Abd gravid nontender Pelvic deferred Extr(-) edema   GBS cx pending( office). ucx not done  Tracing: baseline 150 no ctx variable decel x 1  IMP: PTL @ 24 weeks On Magnesium/indocin GBS cx unknown on PCN  P) will stop magnesium< PCN at 2pm. Remain inpt. D/c foley. Start nifedipine

## 2013-03-01 MED ORDER — SALINE SPRAY 0.65 % NA SOLN
1.0000 | NASAL | Status: DC | PRN
  Administered 2013-03-01: 1 via NASAL
  Filled 2013-03-01: qty 44

## 2013-03-01 MED ORDER — POLYETHYLENE GLYCOL 3350 17 G PO PACK
17.0000 g | PACK | Freq: Every day | ORAL | Status: DC | PRN
  Administered 2013-03-01: 17 g via ORAL
  Filled 2013-03-01: qty 1

## 2013-03-01 MED ORDER — DOCUSATE SODIUM 100 MG PO CAPS: 100.0000 mg | ORAL_CAPSULE | Freq: Two times a day (BID) | ORAL | Status: DC

## 2013-03-01 NOTE — Progress Notes (Signed)
This was a follow-up visit--family was seen by Anne Shutter last week.  They are still processing that they are here and that things are going much differently than they expected.  They have good family support.  Rosabella was concerned about FMLA paperwork since she works for [employer].  I worked with [employer] HR to help her connect to HR over at [employer] who was going to be in touch with her about what she needs to do.  We will continue to follow this family, but please also page as needs arise.  [employer] Pager, [phone] 1:09 PM   03/01/13 1300  Clinical Encounter Type  Visited With Patient and family together  Visit Type Spiritual support;Follow-up  Spiritual Encounters  Spiritual Needs Emotional

## 2013-03-01 NOTE — Progress Notes (Signed)
HD #4 24 1/7wk cervical incompetence/PTL BMZ complete( 10/3. 10/4) procardia  S: feels well denies ctx, vaginal discharge or pelvic pressure Notes urinary frequency  VSS: Afebrile   Lungs clear to A Cor RRR Abd gravid nontender Pelvic deferred Extr(-) edema   GBS cx neg  Urine culture not done  Tracing: baseline 150  Some irritability rare ctx  IMP: PTL @ 241/7 weeks s/p Magnesium/indocin GBS cx neg  P) cont inpt mgmt. Nifedipine po.  HL IV. BM regimen(Miralax, colace)

## 2013-03-01 NOTE — Progress Notes (Signed)
Ur chart review completed.  

## 2013-03-02 ENCOUNTER — Encounter (HOSPITAL_COMMUNITY): Payer: Self-pay | Admitting: *Deleted

## 2013-03-02 LAB — CBC
HCT: 33.6 % — ABNORMAL LOW (ref 36.0–46.0)
MCHC: 33.6 g/dL (ref 30.0–36.0)
RBC: 3.87 MIL/uL (ref 3.87–5.11)
RDW: 13.9 % (ref 11.5–15.5)
WBC: 16.2 10*3/uL — ABNORMAL HIGH (ref 4.0–10.5)

## 2013-03-02 LAB — OB RESULTS CONSOLE GBS: GBS: NEGATIVE

## 2013-03-02 MED ORDER — PHENYLEPHRINE 40 MCG/ML (10ML) SYRINGE FOR IV PUSH (FOR BLOOD PRESSURE SUPPORT)
80.0000 ug | PREFILLED_SYRINGE | INTRAVENOUS | Status: DC | PRN
  Filled 2013-03-02: qty 5
  Filled 2013-03-02: qty 2

## 2013-03-02 MED ORDER — IBUPROFEN 600 MG PO TABS
600.0000 mg | ORAL_TABLET | Freq: Four times a day (QID) | ORAL | Status: DC
  Administered 2013-03-02 – 2013-03-04 (×8): 600 mg via ORAL
  Filled 2013-03-02 (×8): qty 1

## 2013-03-02 MED ORDER — FENTANYL 2.5 MCG/ML BUPIVACAINE 1/10 % EPIDURAL INFUSION (WH - ANES)
14.0000 mL/h | INTRAMUSCULAR | Status: DC | PRN
  Filled 2013-03-02: qty 125

## 2013-03-02 MED ORDER — MAGNESIUM SULFATE BOLUS VIA INFUSION
4.0000 g | Freq: Once | INTRAVENOUS | Status: AC
  Administered 2013-03-02: 4 g via INTRAVENOUS
  Filled 2013-03-02: qty 500

## 2013-03-02 MED ORDER — ONDANSETRON HCL 4 MG PO TABS: 4.0000 mg | ORAL_TABLET | ORAL | Status: DC | PRN

## 2013-03-02 MED ORDER — MAGNESIUM SULFATE 40 G IN LACTATED RINGERS - SIMPLE
2.0000 g/h | INTRAVENOUS | Status: DC
  Filled 2013-03-02: qty 500

## 2013-03-02 MED ORDER — ONDANSETRON HCL 4 MG/2ML IJ SOLN: 4.0000 mg | INTRAMUSCULAR | Status: DC | PRN

## 2013-03-02 MED ORDER — ZOLPIDEM TARTRATE 5 MG PO TABS: 5.0000 mg | ORAL_TABLET | Freq: Every evening | ORAL | Status: DC | PRN

## 2013-03-02 MED ORDER — WITCH HAZEL-GLYCERIN EX PADS: 1.0000 "application " | MEDICATED_PAD | CUTANEOUS | Status: DC | PRN

## 2013-03-02 MED ORDER — SENNOSIDES-DOCUSATE SODIUM 8.6-50 MG PO TABS
2.0000 | ORAL_TABLET | ORAL | Status: DC
  Administered 2013-03-03 (×2): 2 via ORAL
  Filled 2013-03-02 (×2): qty 2

## 2013-03-02 MED ORDER — PHENYLEPHRINE 40 MCG/ML (10ML) SYRINGE FOR IV PUSH (FOR BLOOD PRESSURE SUPPORT)
80.0000 ug | PREFILLED_SYRINGE | INTRAVENOUS | Status: DC | PRN
  Filled 2013-03-02: qty 2

## 2013-03-02 MED ORDER — FERROUS SULFATE 325 (65 FE) MG PO TABS
325.0000 mg | ORAL_TABLET | Freq: Two times a day (BID) | ORAL | Status: DC
  Administered 2013-03-02 – 2013-03-04 (×4): 325 mg via ORAL
  Filled 2013-03-02 (×4): qty 1

## 2013-03-02 MED ORDER — DIPHENHYDRAMINE HCL 50 MG/ML IJ SOLN: 12.5000 mg | INTRAMUSCULAR | Status: DC | PRN

## 2013-03-02 MED ORDER — BENZOCAINE-MENTHOL 20-0.5 % EX AERO: 1.0000 "application " | INHALATION_SPRAY | CUTANEOUS | Status: DC | PRN

## 2013-03-02 MED ORDER — DIPHENHYDRAMINE HCL 25 MG PO CAPS: 25.0000 mg | ORAL_CAPSULE | Freq: Four times a day (QID) | ORAL | Status: DC | PRN

## 2013-03-02 MED ORDER — PRENATAL MULTIVITAMIN CH
1.0000 | ORAL_TABLET | Freq: Every day | ORAL | Status: DC
  Administered 2013-03-02 – 2013-03-03 (×2): 1 via ORAL
  Filled 2013-03-02 (×2): qty 1

## 2013-03-02 MED ORDER — OXYTOCIN 40 UNITS IN LACTATED RINGERS INFUSION - SIMPLE MED
62.5000 mL/h | INTRAVENOUS | Status: DC
  Administered 2013-03-02: 62.5 mL/h via INTRAVENOUS

## 2013-03-02 MED ORDER — BUTORPHANOL TARTRATE 1 MG/ML IJ SOLN
1.0000 mg | INTRAMUSCULAR | Status: DC | PRN
  Administered 2013-03-02: 1 mg via INTRAVENOUS
  Filled 2013-03-02: qty 1

## 2013-03-02 MED ORDER — OXYCODONE-ACETAMINOPHEN 5-325 MG PO TABS
1.0000 | ORAL_TABLET | ORAL | Status: DC | PRN
  Administered 2013-03-02: 1 via ORAL
  Filled 2013-03-02: qty 1

## 2013-03-02 MED ORDER — EPHEDRINE 5 MG/ML INJ
10.0000 mg | INTRAVENOUS | Status: DC | PRN
  Filled 2013-03-02: qty 2

## 2013-03-02 MED ORDER — TERBUTALINE SULFATE 1 MG/ML IJ SOLN
0.2500 mg | Freq: Once | INTRAMUSCULAR | Status: AC
  Administered 2013-03-02: 0.25 mg via SUBCUTANEOUS
  Filled 2013-03-02 (×2): qty 1

## 2013-03-02 MED ORDER — DIBUCAINE 1 % RE OINT: 1.0000 "application " | TOPICAL_OINTMENT | RECTAL | Status: DC | PRN

## 2013-03-02 MED ORDER — SIMETHICONE 80 MG PO CHEW: 80.0000 mg | CHEWABLE_TABLET | ORAL | Status: DC | PRN

## 2013-03-02 MED ORDER — EPHEDRINE 5 MG/ML INJ
10.0000 mg | INTRAVENOUS | Status: DC | PRN
  Filled 2013-03-02: qty 4
  Filled 2013-03-02: qty 2

## 2013-03-02 MED ORDER — TETANUS-DIPHTH-ACELL PERTUSSIS 5-2.5-18.5 LF-MCG/0.5 IM SUSP
0.5000 mL | Freq: Once | INTRAMUSCULAR | Status: AC
  Administered 2013-03-03: 0.5 mL via INTRAMUSCULAR
  Filled 2013-03-02: qty 0.5

## 2013-03-02 MED ORDER — ALBUTEROL SULFATE HFA 108 (90 BASE) MCG/ACT IN AERS: 2.0000 | INHALATION_SPRAY | Freq: Four times a day (QID) | RESPIRATORY_TRACT | Status: DC | PRN

## 2013-03-02 MED ORDER — LANOLIN HYDROUS EX OINT: TOPICAL_OINTMENT | CUTANEOUS | Status: DC | PRN

## 2013-03-02 MED ORDER — OXYTOCIN 40 UNITS IN LACTATED RINGERS INFUSION - SIMPLE MED
INTRAVENOUS | Status: AC
  Administered 2013-03-02: 62.5 mL/h via INTRAVENOUS
  Filled 2013-03-02: qty 1000

## 2013-03-02 MED ORDER — BUTORPHANOL TARTRATE 1 MG/ML IJ SOLN
INTRAMUSCULAR | Status: AC
  Administered 2013-03-02: 1 mg via INTRAVENOUS
  Filled 2013-03-02: qty 1

## 2013-03-02 MED ORDER — LACTATED RINGERS IV SOLN
500.0000 mL | Freq: Once | INTRAVENOUS | Status: AC
  Administered 2013-03-02: 500 mL via INTRAVENOUS

## 2013-03-02 MED ORDER — BUTORPHANOL TARTRATE 1 MG/ML IJ SOLN
1.0000 mg | Freq: Once | INTRAMUSCULAR | Status: AC
  Administered 2013-03-02: 1 mg via INTRAVENOUS

## 2013-03-02 NOTE — Progress Notes (Signed)
03/02/13 1400  Clinical Encounter Type  Visited With Patient and family together (husband)  Visit Type Follow-up;Spiritual support;Social support  Referral From Chaplain Dyanne Carrel)  Spiritual Encounters  Spiritual Needs Emotional  Stress Factors  Patient Stress Factors Loss of control;Major life changes (processing baby's early arrival and high physical needs)  Family Stress Factors Loss of control;Major life changes (processing baby's early arrival and high physical needs)   Met pt and husband on their way back to [facility] from NICU.  They were emotionally and physically tired from working to process their baby's very premature birth (including risks, changes in hopes and expectations, etc).  They were appreciative of Chaplain Katy Claussen's initial contact and this follow-up.  We plan to check in together tomorrow for further processing and support, but please also page as needed:  [phone].  Thank you.  [address] [place], [place] [phone]

## 2013-03-02 NOTE — Progress Notes (Signed)
Pt called me to bedside with c/o regular stronger uc's.  Husband at bedside, supportive in helping pt breathe through ucs

## 2013-03-02 NOTE — Progress Notes (Addendum)
Request pain med  Notes urge to push with ctx( intermittent)  VE: unchanged  Tracing: baseline 150 Ctx  irreg  IMP: Advanced dilation/PTL IUP @ 24 2/7 weeks P) cont Magnesium. No PCN due to GBS cx neg. Stadol prn  Addendum; cbc ordered. ucx sent. Both patient and husband expressed desire for vaginal delivery

## 2013-03-02 NOTE — Progress Notes (Signed)
Delivery call made at [phone].  Team present at [phone]

## 2013-03-02 NOTE — Lactation Note (Signed)
This note was copied from the chart of Kimberly Delgado. Lactation Consultation Note   Initial consult with this mom of a 24 2/7 week baby in NICU. 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 She wants to provide breast milk for her baby, so I started her pumping with a dEP, and showed mom how to hand express. She was able to express 1 ml of colostrum to bring to her baby. I told mom and dad that was a great volume. I reviewed the NICU booklet and the lactation folder with mom and da, and showed them how to use their PIS home DEP. I decreased mom to 21 flanges, but after pumping, increased mom back to 24's. I will follow this family in the NICU. Mom knows to call for questions/concerns.  Patient Name: Kimberly Delgado Date: 03/02/2013 Reason for consult: Initial assessment;NICU baby   Maternal Data Formula Feeding for Exclusion: Yes (baby in NICU) Infant to breast within first hour of birth: No Breastfeeding delayed due to:: Infant status Has patient been taught Hand Expression?: Yes Does the patient have breastfeeding experience prior to this delivery?: No  Feeding    LATCH Score/Interventions                      Lactation Tools Discussed/Used Tools: Pump WIC Program: No Pump Review: Setup, frequency, and cleaning;Milk Storage;Other (comment) (hand expression, NICU teaching on proviing EBM) Initiated by:: clee Rn at 6 hours post partum Date initiated:: 03/02/13   Consult Status Consult Status: Follow-up Date: 03/03/13 Follow-up type: In-patient    Alfred Levins 03/02/2013, 5:06 PM

## 2013-03-02 NOTE — Progress Notes (Signed)
Called by RN regarding increased ctx despite Magnesium. Exam by RN showed no palp cervix w/ ballooning of bag in vagina. Pt subsequently SROM @ 9:49 am. Pt notes more painful ctx and increased pressure.   ON arrival, bloody fluid noted per vagina. Exam showed fetus in vagina.  NICU in attendance. Foley discontinued.  ImP: Unstoppable labor( preterm) P) start pushing

## 2013-03-02 NOTE — Progress Notes (Signed)
Called for vaginal pain and pressure. Exam by RN;  Bulging bag in vagina.  SONo : vtx presentation  Pt notes painful ctx  VE: hourglass cervix ( 5 cm)/vtx ballottable w/ bulging membrane  Tracing baseline 150 (+) ctx q 2-3 GBS cx neg  IMP: advanced PTL IUP @ 24 2/7 weeks. BMZ complete  P) will give IV stadol. Restart Magnesium . Cont fetal monitoring. foley

## 2013-03-03 ENCOUNTER — Encounter (HOSPITAL_COMMUNITY): Payer: Self-pay | Admitting: Obstetrics and Gynecology

## 2013-03-03 LAB — CBC
HCT: 29.8 % — ABNORMAL LOW (ref 36.0–46.0)
Platelets: 194 10*3/uL (ref 150–400)
RDW: 13.7 % (ref 11.5–15.5)
WBC: 15.4 10*3/uL — ABNORMAL HIGH (ref 4.0–10.5)

## 2013-03-03 LAB — URINE CULTURE: Culture: NO GROWTH

## 2013-03-03 MED ORDER — RHO D IMMUNE GLOBULIN 1500 UNIT/2ML IJ SOLN
300.0000 ug | Freq: Once | INTRAMUSCULAR | Status: AC
  Administered 2013-03-03: 300 ug via INTRAMUSCULAR
  Filled 2013-03-03: qty 2

## 2013-03-03 NOTE — Progress Notes (Signed)
03/03/13 1300  Clinical Encounter Type  Visited With Patient and family together (husband Helen Hashimoto)  Visit Type Follow-up;Spiritual support;Social support  Spiritual Encounters  Spiritual Needs Emotional   Followed up in more detail with Osiris, husband Jess Barters, and her mom Marylu Lund.  Primarily we discussed and processed the particular experiences of NICU families and families of preemies, and they were curious and appreciative to learn about support resources Civil Service fast streamer, CSW, Orthoptist).  They plan to reach out as desired.  Spiritual Care will follow, but please also page as needs arise:  [phone].  Thank you.  [address] [place], [place] [phone]

## 2013-03-03 NOTE — Lactation Note (Signed)
This note was copied from the chart of Kimberly Delgado. Lactation Consultation Note    Follow up consult with this mom  Of a NICU baby, now 26 hours post partum, and  24 3/[redacted] weeks gestation. Dr Katrinka Blazing came up and updated parents while I was with them. The baby is stable, on pressors, no PDA seen, and is having a head ultrasound today. I reviewed hand expression with mom , and advised her to pump and then try to collect colostrum she was able to collect a few large drops os colostrum. Mom and dad work great together. Donated EBM from dad's sister, who is a NICU NNP in [place], [place], should be arriving today. Parents are aware that the baby will still get some formula products, for supplementation, etc.   Mom reports her nipples feeling dry - I advised her ot avoid lanolin, but instead to use EBM - which mom said felt "really good" I also gave mom comfort gels, to protect her sensitive skin, and instructed her in their use. Mom is going to purchase a hands free bra. I will follow this family in the nICU  Patient Name: Kimberly Kiki Bivens [id number] Date: 03/03/2013 Reason for consult: Follow-up assessment;NICU baby   Maternal Data    Feeding    LATCH Score/Interventions          Intervention(s): Expressed breast milk to nipple (for dry nipples)           Lactation Tools Discussed/Used Tools: Comfort gels (to prevent breakdown - mom has very sensitive skin)   Consult Status Consult Status: Follow-up Date: 03/04/13 Follow-up type: In-patient    Alfred Levins 03/03/2013, 2:40 PM

## 2013-03-03 NOTE — Progress Notes (Signed)
Patient ID: Kimberly Delgado, female   DOB: [birth year], 27 y.o.   MRN: [id number] PPD # 1 SVD  S:  Reports feeling well             Tolerating po/ No nausea or vomiting             Bleeding is light             Pain controlled with ibuprofen (OTC)             Up ad lib / ambulatory / voiding without difficulties    Newborn  Information for the patient's newborn:  Layni, Kreamer [[id number]]  female  bottle feeding pumped colostrum - baby in NICU / Circumcision planning   O:  A & O x 3, in no apparent distress              VS:  Filed Vitals:   03/02/13 1315 03/02/13 1800 03/02/13 2120 03/03/13 0651  BP: 99/55 104/67 96/61 118/70  Pulse: 73 84 78 70  Temp: 98.1 F (36.7 C) 98 F (36.7 C) 98.2 F (36.8 C)   TempSrc: Oral Oral Oral   Resp: 16 18 18 18   Height:      Weight:      SpO2: 97% 96% 96% 98%    LABS:  Recent Labs  03/02/13 0730 03/03/13 0530  WBC 16.2* 15.4*  HGB 11.3* 10.3*  HCT 33.6* 29.8*  PLT 217 194    Blood type: --/--/O NEG (10/08 0530)  Rubella: Immune (10/03 0000)   I&O: I/O last 3 completed shifts: In: 417.9 [P.O.:120; I.V.:297.9] Out: 695 [Urine:445; Blood:250]             Lungs: Clear and unlabored  Heart: regular rate and rhythm / no murmurs  Abdomen: soft, non-tender, non-distended              Fundus: firm, non-tender, U-2  Perineum: intact, no edema  Lochia: light  Extremities: no edema, no calf pain or tenderness, no Homans    A/P: PPD # 1  27 y.o., G1P0101   Principal Problem:   Postpartum care following vaginal delivery (10/7) Active Problems:   Cervical incompetence @ 23 weeks   Threatened preterm labor, antepartum   Preterm delivery (10/7)   Doing well - stable status  Routine post partum orders  Anticipate discharge tomorrow    Raelyn Mora, M, MSN, CNM 03/03/2013, 11:59 AM

## 2013-03-04 LAB — RH IG WORKUP (INCLUDES ABO/RH)
ABO/RH(D): O NEG
Fetal Screen: NEGATIVE
Gestational Age(Wks): 24.2
Unit division: 0

## 2013-03-04 MED ORDER — FERROUS SULFATE 325 (65 FE) MG PO TABS: 325.0000 mg | ORAL_TABLET | Freq: Every day | ORAL | Status: DC

## 2013-03-04 MED ORDER — SENNOSIDES-DOCUSATE SODIUM 8.6-50 MG PO TABS: 2.0000 | ORAL_TABLET | Freq: Two times a day (BID) | ORAL | Status: DC | PRN

## 2013-03-04 MED ORDER — IBUPROFEN 600 MG PO TABS: 600.0000 mg | ORAL_TABLET | Freq: Four times a day (QID) | ORAL | Status: DC | PRN

## 2013-03-04 NOTE — Progress Notes (Addendum)
Patient ID: Kimberly Delgado, female   DOB: Apr 12, 1986, 27 y.o.   MRN: [id number] PPD # 2 S/P SVD @ 24wks; cervical incompetence  Subjective: Pt reports feeling "ok"; not sleeping well; notable sadness regarding infant's poor status / Pain controlled with ibuprofen Tolerating po/ Voiding without problems/ No n/v Bleeding is light/ Newborn info:  Information for the patient's newborn:  Armenta, Erskin [[id number]]  female Feeding: pumping Infant not doing well in NICU    Objective:  VS: Blood pressure 116/79, pulse 62, temperature 97.7 F (36.5 C), temperature source Oral, resp. rate 18.    Recent Labs  03/02/13 0730 03/03/13 0530  WBC 16.2* 15.4*  HGB 11.3* 10.3*  HCT 33.6* 29.8*  PLT 217 194    Blood type: --/--/O NEG (10/08 0530) Rubella: Immune (10/03 0000)    Physical Exam:  General:  alert, cooperative and no distress CV: Regular rate and rhythm Resp: clear Abdomen: soft, nontender, normal bowel sounds Uterine Fundus: firm, below umbilicus, nontender Perineum: not inspected Lochia: minimal Ext: edema trace and Homans sign is negative, no sign of DVT    A/P: PPD # 2/ G1P0101/ S/P: SVD @ 24wk 2d; cervical incompetence Mild ABL Anemia Stable for discharge home RX: Ibuprofen 600mg  po Q 6 hrs prn pain #30 Refill x 1 Iron supplement 150mg  po QD/BID #30/#60 Refill x 1 Ambien 10 mg po QHS prn insomnia WOB/GYN booklet given Routine pp visit in 6wks   Demetrius Revel, MSN, [facility] 03/04/2013, 8:49 AM

## 2013-03-04 NOTE — Progress Notes (Signed)
03/04/13 1300  Clinical Encounter Type  Visited With Patient and family together;Health care provider (husband Kimberly Delgado, parents Kimberly Delgado and Kimberly Delgado)  Visit Type (extubation, death of baby)  Spiritual Encounters  Spiritual Needs Grief support;Emotional   Provided emotional/bereavement and logistical support to Kimberly Delgado and her family for ca 3 hours as they approached and moved through the death of their baby.  Spent considerable time with [name], providing pastoral support and grief education.  Gathered Pharmacist, hospital for photographs of baby.  Collaborated with MD, NP, RN, FSN, CSW, etc.  Family appreciative.  [address] [place], [place] [phone]

## 2013-03-04 NOTE — Progress Notes (Signed)
Clinical Social Work Department  PSYCHOSOCIAL ASSESSMENT - MATERNAL/CHILD  03/03/2013  Patient: Kimberly Delgado, Kimberly Delgado Account Number: [id number] Admit Date: 02/26/2013  Marjo Bicker Name:  Steele Berg   Clinical Social Worker: Lulu Riding, LCSW Date/Time: 03/03/2013 04:45 PM  Date Referred: 03/03/2013  Referral source   NICU    Referred reason   NICU   Other referral source:  I: FAMILY / HOME ENVIRONMENT  Child's legal guardian: PARENT  Guardian - Name  Guardian - Age  Guardian - Address   Kimberly Delgado  [address]  [address] [place], [place] [address]   Kimberly Delgado  31  same   Other household support members/support persons  Other support:  Great support system.   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 Maisie Fus, is a doctor there. CSW asked her to let us know if this changes.  IV RISK FACTORS AND CURRENT PROBLEMS  Current Problem: None  Risk Factor & Current Problem  Patient Issue  Family Issue  Risk Factor / Current Problem Comment    N  N    V SOCIAL WORK ASSESSMENT  CSW met with parents in [name]'s third floor room to introduce myself, complete assessment and offer support. Parents appear very supportive of each other. They had visitors who stepped out to let parents speak to CSW. FOB  stayed for a few minutes and then left to meet up with another family member who had arrived to visit. Parents were extemely pleasant and appear appreciative of everyone's care during this time. They report having had a trip planned to go to [place] with family and that FOB's parents have a house there and are already there. They were supposed to leave on Saturday. [name] had an appointment on Friday where they found her to be dialated and in preterm labor. She then was admitted to the hospital and had the baby shortly after. Parents appear to be processing things very well given the shock they are most likely experiencing. [name] was very open about her feelings and seems to be coping extremely well at this time. She states she and FOB have had some tough discussions about the possibility that baby won't survive and about not wanting to keep him alive if his prognosis is poor quality of life. CSW discussed common emotions and validated [name]'s feelings. CSW recommends a counselor and offered for [name] to speak with CSW at any time while baby is in the hospital to process her feelings. [name] states she has a hx of depression and has a Veterinary surgeon though the [employer] and states she can return at any time. CSW suggests she consider medication during this difficult emotional time and to talk with her doctor if she thinks this would be beneficial. CSW explained that there are safe medications to take while breast feedings and that  the lactation consultant can speak more about this. [name] was appropriately tearful and thanked CSW for being realistic with her. CSW encouraged her to have hope, but continue to be realistic. CSW thanked [name] for talking and asked her to call any time. CSW gave contact information.  VI SOCIAL WORK PLAN  Social Work Plan   Psychosocial Support/Ongoing Assessment of Needs   Type of pt/family education:  PPD signs and symptoms   Common emotions related to the NICU  experience/recommendation for counseling   Baby's eligibility for SSI   If child protective services report - county:  If child protective services report - date:  Information/referral to community resources comment:  SSI   Other social work plan:

## 2013-03-04 NOTE — Progress Notes (Signed)
Pt discharged to go home. Pt and husband went back to NICU. Baby is in unstable condition. Teaching complete. Continuing emotional support provided by staff and chaplin.

## 2013-03-04 NOTE — Discharge Summary (Signed)
Obstetric Discharge Summary Reason for Admission: cervical dilation @ 23 5/7 weeks/cervical incompetence Prenatal Procedures: NST and ultrasound Intrapartum Procedures: spontaneous vaginal delivery Postpartum Procedures: none Complications-Operative and Postpartum: none Hemoglobin  Date Value Range Status  03/03/2013 10.3* 12.0 - 15.0 g/dL Final     HCT  Date Value Range Status  03/03/2013 29.8* 36.0 - 46.0 % Final    Physical Exam:  General: alert, cooperative and no distress Lochia: appropriate Uterine Fundus: firm Incision: n/a DVT Evaluation: No evidence of DVT seen on physical exam. Negative Homan's sign.  Hospital course: Pt was admitted to [facility] after she was found to have cervical length of 1.2 cm on f/u anatomic sono. Cervix was visually dilated on speculum exam. BMZ was started. MFM consult obtained which concurred that cerclage was not recommended. Pt  Also had GBS cx done in office. Urinalysis,urine culture, cbc obtained.  No contraction or cramping noted on monitor or perceived by pt.  Later that day, pt was noted to have contractions, she was started on magnesium and IV PCN. Due to persistence of ctx, indocin was started . Subcutaneous terbutaline x 1 given. Pt completed BMZ  And had indocin x 48 hr, magnesium continued for 24 hr beyond the last betamethasone. Pt was started on oral procardia. Pt did well with occ contraction. Exam had progressed to 3 cm during this process. Pt then went into full labor and was transferred to L&D( pelvic pressure and no palpable cervix bY RN). On transfer to L&D, vtx presentation confirmed but cervix was hourglassing. Pt  Became fully dilated and subsequently delivered a viable female infant who was transferred to NICU. Postpartum course was unremarkable and pt requested early discharge. Placenta to pathology. There was no evidence of chorioamnionitis on delivery.  Discharge Diagnoses: G1 P0 1 0 1 S/P SVD @ 24w 2d. Cervical incompetency. PTL with  resultant PTB  Discharge Information: Date: 03/04/2013 Activity: pelvic rest Diet: routine Medications: PNV, Ibuprofen, Colace, Iron and Ambien Condition: stable Instructions: refer to practice specific booklet Discharge to: home Follow-up Information   Follow up with Sallyann Kinnaird A, MD In 6 weeks.   Specialty:  Obstetrics and Gynecology   Contact information:   [address] [place] [address] [phone]       Newborn Data: Live born female  Birth Weight: 1 lb 6.6 oz (640 g) APGAR: 1, 6  Remaining in NICU; not stable.  FISHER,JULIE K 03/04/2013, 9:20 AM

## 2013-03-06 LAB — TYPE AND SCREEN
ABO/RH(D): O NEG
DAT, IgG: NEGATIVE
Unit division: 0

## 2014-01-04 ENCOUNTER — Ambulatory Visit: Payer: Self-pay

## 2014-01-04 ENCOUNTER — Ambulatory Visit (INDEPENDENT_AMBULATORY_CARE_PROVIDER_SITE_OTHER): Payer: 59 | Admitting: Podiatry

## 2014-01-04 DIAGNOSIS — M2010 Hallux valgus (acquired), unspecified foot: Secondary | ICD-10-CM

## 2014-01-04 DIAGNOSIS — M779 Enthesopathy, unspecified: Secondary | ICD-10-CM

## 2014-01-04 DIAGNOSIS — M2021 Hallux rigidus, right foot: Secondary | ICD-10-CM

## 2014-01-04 DIAGNOSIS — M2020 Hallux rigidus, unspecified foot: Secondary | ICD-10-CM

## 2014-01-04 DIAGNOSIS — R52 Pain, unspecified: Secondary | ICD-10-CM

## 2014-01-04 MED ORDER — TRIAMCINOLONE ACETONIDE 10 MG/ML IJ SUSP
10.0000 mg | Freq: Once | INTRAMUSCULAR | Status: AC
  Administered 2014-01-04: 10 mg

## 2014-01-04 NOTE — Progress Notes (Signed)
Subjective:     Patient ID: Kimberly Delgado, female   DOB: [birth year], 28 y.o.   MRN: [id number]  Toe Pain    patient presents stating that she was at the beach and she flexed her big toe in the sand and it caught and it's been painful ever since. States it's been this way for the last 2 weeks and also complains of structural bunion deformity on the left foot with a family history of this problem   Review of Systems  All other systems reviewed and are negative.      Objective:   Physical Exam  Nursing note and vitals reviewed. Constitutional: She is oriented to person, place, and time.  Cardiovascular: Intact distal pulses.   Musculoskeletal: Normal range of motion.  Neurological: She is oriented to person, place, and time.  Skin: Skin is warm.   neurovascular status intact with muscle strength adequate and range of motion of the subtalar midtarsal joint within normal limits. Patient's found to have mild equinus and is noted to have mild structural bunion deformity left with deviation the hallux against the second toe. 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 Today I carefully injected around the joint 3 mg Kenalog 5 mg I can Marcaine mixture and instructed on reduced activity. I discussed long-term orthotics to try to control motion and hopefully slow down structural bunion deformity and we will see her back in 2 weeks to see how she's responded and make a decision on the best type of orthotic for her long-term

## 2014-01-04 NOTE — Progress Notes (Signed)
   Subjective:    Patient ID: Kimberly Delgado, female    DOB: 1985-07-21, 28 y.o.   MRN: [id number]  HPI  PT STATED INJURED RT FOOT GREAT TOE JOINT  IS STILL PAINFUL FOR 2 WEEKS. THE TOE IS BEEN THE SAME NOT GETTING WORSE.  THE TOE GET AGGRAVATED BY WALKING AND WHEN FLEXING. TRIED ICE, IBUFROPEN, AND WRAPPING WITH THE OTHER TOE AND HELP SOME.    Review of Systems  All other systems reviewed and are negative.      Objective:   Physical Exam        Assessment & Plan:

## 2014-01-18 ENCOUNTER — Ambulatory Visit (INDEPENDENT_AMBULATORY_CARE_PROVIDER_SITE_OTHER): Payer: 59 | Admitting: Podiatry

## 2014-01-18 ENCOUNTER — Encounter: Payer: Self-pay | Admitting: Podiatry

## 2014-01-18 VITALS — BP 122/86 | HR 75 | Resp 12

## 2014-01-18 DIAGNOSIS — M779 Enthesopathy, unspecified: Secondary | ICD-10-CM

## 2014-01-18 DIAGNOSIS — M2021 Hallux rigidus, right foot: Secondary | ICD-10-CM

## 2014-01-18 DIAGNOSIS — M2020 Hallux rigidus, unspecified foot: Secondary | ICD-10-CM

## 2014-01-18 MED ORDER — DICLOFENAC SODIUM 75 MG PO TBEC
75.0000 mg | DELAYED_RELEASE_TABLET | Freq: Two times a day (BID) | ORAL | Status: DC
Start: 2014-01-18 — End: 2015-02-17

## 2014-01-18 NOTE — Progress Notes (Signed)
Subjective:     Patient ID: Kimberly Delgado, female   DOB: 08/12/1985, 28 y.o.   MRN: [id number]  HPI patient states that the right big toe is still hurting her and she's having trouble bearing weight down on. Did not get the response to medication that she hoped for   Review of Systems     Objective:   Physical Exam Neurovascular status intact with muscle strength adequate and noted to have discomfort in the medial and lateral side of the first MPJ right with pain upon dorsiflexion and plantarflexion and unable to bend the toe down as well as the left foot    Assessment:     Continued inflammatory condition of the right first MPJ which was most likely trauma     Plan:     Explained condition and recommended immobilization and applied air fracture walker to completely reduce dorsi and plantarflexion. We may have to consider MRI if symptoms pesist

## 2014-02-08 ENCOUNTER — Ambulatory Visit (INDEPENDENT_AMBULATORY_CARE_PROVIDER_SITE_OTHER): Payer: 59 | Admitting: Podiatry

## 2014-02-08 VITALS — BP 138/101 | HR 89 | Resp 16

## 2014-02-08 DIAGNOSIS — M2021 Hallux rigidus, right foot: Secondary | ICD-10-CM

## 2014-02-08 DIAGNOSIS — M779 Enthesopathy, unspecified: Secondary | ICD-10-CM

## 2014-02-08 DIAGNOSIS — M2020 Hallux rigidus, unspecified foot: Secondary | ICD-10-CM

## 2014-02-08 NOTE — Progress Notes (Signed)
Subjective:     Patient ID: Kimberly Delgado, female   DOB: August 30, 1985, 28 y.o.   MRN: [id number]  HPI patient presents stating that my toe joint is feeling quite a bit better but still get sore at times and I'm worried that have not been able to do activities that I want   Review of Systems     Objective:   Physical Exam Neurovascular status intact muscle strength adequate with continued moderate discomfort around the first MPJ right with movement of the big toe in a dorsiflex position    Assessment:     Continued hallux limitus like inflammation and capsulitis that has improved quite a bit but still is present    Plan:     Discussed condition and scanned for custom orthotics to reduce stress along with graphite extension on the right. Reappoint when orthotics returned

## 2014-03-11 ENCOUNTER — Ambulatory Visit (INDEPENDENT_AMBULATORY_CARE_PROVIDER_SITE_OTHER): Payer: 59 | Admitting: *Deleted

## 2014-03-11 DIAGNOSIS — M2021 Hallux rigidus, right foot: Secondary | ICD-10-CM

## 2014-03-11 NOTE — Progress Notes (Signed)
Pt presents for orthotic pick up written and verbal instructions are given 

## 2014-03-11 NOTE — Patient Instructions (Signed)

## 2014-03-28 ENCOUNTER — Encounter: Payer: Self-pay | Admitting: Podiatry

## 2014-06-10 ENCOUNTER — Ambulatory Visit (INDEPENDENT_AMBULATORY_CARE_PROVIDER_SITE_OTHER): Payer: 59 | Admitting: Podiatry

## 2014-06-10 ENCOUNTER — Encounter: Payer: Self-pay | Admitting: Podiatry

## 2014-06-10 VITALS — BP 110/62 | HR 64 | Resp 16

## 2014-06-10 DIAGNOSIS — R52 Pain, unspecified: Secondary | ICD-10-CM

## 2014-06-10 DIAGNOSIS — M2021 Hallux rigidus, right foot: Secondary | ICD-10-CM | POA: Diagnosis not present

## 2014-06-11 NOTE — Progress Notes (Signed)
Subjective:     Patient ID: Kimberly Delgado, female   DOB: 25-May-1986, 29 y.o.   MRN: [id number]  HPI patient presents stating she is still developing a lot of pain in her right big toe joint despite rigid immobilization injection treatment and orthotics. 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 Given the amount of conservative care that's been rendered most likely this will require a biplanar osteotomy to shorten and plantar flex the first metatarsal and I explained this to patient and we also reviewed the potential for physical therapy. Patient is given a make a decision as to what would be best for her and I did go ahead today and allow her to read a consent form for the correction reviewing all possible complications and alternative treatments. She understands there is no guarantee this will solve her problem and ultimately could require fusion or implantation procedure and she signs consent form and is given preoperative instructions. She may decide for a period of physical therapy prior to surgery or go straight to surgery after talking to her husband. She is also trying to get pregnant which may impact what we will be able to do

## 2014-09-21 ENCOUNTER — Encounter
Admit: 2014-09-21 | Disposition: A | Discharge status: Home / Self Care | Payer: Self-pay | Attending: Podiatry | Admitting: Podiatry

## 2014-09-28 ENCOUNTER — Ambulatory Visit: Payer: 59

## 2014-09-28 DIAGNOSIS — R269 Unspecified abnormalities of gait and mobility: Secondary | ICD-10-CM

## 2014-09-28 DIAGNOSIS — M79674 Pain in right toe(s): Secondary | ICD-10-CM

## 2014-09-28 DIAGNOSIS — M79676 Pain in unspecified toe(s): Secondary | ICD-10-CM | POA: Insufficient documentation

## 2014-09-28 NOTE — Therapy (Signed)
[place] MAIN [facility] SERVICES [address] [place], [place], [address] Phone: [phone]   Fax:  [phone]  Physical Therapy Treatment  Patient Details  Name: Kimberly Delgado MRN: [id number] Date of Birth: 04-22-1986 Referring Provider:  No ref. provider found  Encounter Date: 09/28/2014      PT End of Session - 09/28/14 1039    Visit Number 2   Number of Visits 16   Date for PT Re-Evaluation 10/19/14   PT Start Time 0935   PT Stop Time 1015   PT Time Calculation (min) 40 min   Activity Tolerance Patient tolerated treatment well  pt reported sorness in the foot and calf    Behavior During Therapy [facility] for tasks assessed/performed      Past Medical History  Diagnosis Date  . Anxiety     Past Surgical History  Procedure Laterality Date  . Wisdom tooth extraction N/A     There were no vitals filed for this visit.  Visit Diagnosis:  Pain of toe of right foot  Abnormality of gait      Subjective Assessment - 09/28/14 1025    Subjective pt reports she has been compliant with HEP thus far and reports her pain is improving. pt reports she did lunges without modifying and it was ok in last nights class. pt reports she has been doing soft tissue massage using a roller to her foot which also seems to help.    Patient Stated Goals get back to running nad PLOF without toe pain.    Currently in Pain? Yes   Pain Score 2    Pain Location Foot   Pain Orientation Right  plantar great toe.    Pain Descriptors / Indicators Aching            [facility] PT Assessment - 09/28/14 0001    Assessment   Medical Diagnosis R great toe pain   Onset Date 12/20/13   Precautions   Precautions None   Restrictions   Weight Bearing Restrictions No   Balance Screen   Has the patient fallen in the past 6 months No   Has the patient had a decrease in activity level because of a fear of falling?  No   Is the patient reluctant to leave their  home because of a fear of falling?  No   Observation/Other Assessments   Lower Extremity Functional Scale  64/80   ROM / Strength   AROM / PROM / Strength AROM   AROM   Right Ankle Dorsiflexion --  improved since last visit- +5 deg DF   Flexibility   Soft Tissue Assessment /Muscle Length --   Palpation   Palpation pt tender over medial calf with palpable trigger points, as well as adductor hallicus and plantar fascia on the R                     [facility] Adult PT Treatment/Exercise - 09/28/14 0001    Exercises   Exercises Ankle   Manual Therapy   Manual Therapy Joint mobilization;Massage;Other (comment)  Trigger point dry needling   Joint Mobilization PT performed grade 3 joint mobs to mid foot and 1MTP jonit AP and PT 30 s each    Massage STM preformed to pts R calf in prone and plantar fascia. include efflurage and muscle stripping  crepitus noted in plantar fascia, TrPs in med calf and foot   Other Manual Therapy Trigger point dry needling to the  medial calf, abductor hallusis and flexor digitorum brevis in the R foot  latent twitch response noted in calf and abductor hallusis   Ankle Exercises: Stretches   Plantar Fascia Stretch 2 reps;20 seconds   Soleus Stretch 2 reps;20 seconds   Gastroc Stretch 2 reps;20 seconds      Patient received dry needling therapy education and acknowledged understanding of risks and benefits of dry needling therapy prior to receiving treatment. Patient voiced understanding of treatment options and elected to proceed with dry needling therapy.             PT Education - 09/28/14 1038    Education provided Yes   Education Details continue HEP, expect soreness and or brusing at dry needling sites   Person(s) Educated Patient   Methods Explanation   Comprehension Verbalized understanding             PT Long Term Goals - 09/28/14 1043    PT LONG TERM GOAL #1   Title pt will tolerate jumping using bilateral :LEs without  symptom reproduction   Time 6   Period Weeks   Status New   PT LONG TERM GOAL #2   Title pt will tolerate running greater than 3 minutes without symptom reproduction    Time 8   Period Weeks   Status New   PT LONG TERM GOAL #3   Title pt will return to prior work activities for 8 hours without increased in symptoms   Time 8   Period Weeks   Status New   PT LONG TERM GOAL #4   Title pt will be able to return to prior exercise routine without increase in symtpoms.    Time 8   Period Weeks   Status New               Plan - 09/28/14 1040    Clinical Impression Statement pt demonstrated improved flexibility following HEP including stretching. Trigger points found in both the medical calf and R foot which were treated today with STM and trigger point dry needling. pt tolerated this well and did not have abnormal response or adverse effect.  pt encoraged to continue with HEP and to expect some soreness after todays session.    Pt will benefit from skilled therapeutic intervention in order to improve on the following deficits Abnormal gait;Decreased activity tolerance;Decreased balance;Pain;Decreased strength;Decreased range of motion;Difficulty walking;Impaired flexibility   Rehab Potential Good   PT Frequency 2x / week   PT Duration 8 weeks   PT Next Visit Plan reassess flexbility and areas of tenderness- progress ankle stability as tolerated.    Consulted and Agree with Plan of Care Patient        Problem List There are no active problems to display for this patient.  Gorden Harms. Aditya Nastasi, PT, DPT [phone]  Tennelle Taflinger 09/28/2014, 10:46 AM  [place] MAIN [facility] SERVICES [address] [place], [place], [address] Phone: [phone]   Fax:  [phone]

## 2014-10-04 ENCOUNTER — Ambulatory Visit: Payer: 59

## 2014-10-04 DIAGNOSIS — R269 Unspecified abnormalities of gait and mobility: Secondary | ICD-10-CM

## 2014-10-04 DIAGNOSIS — M79674 Pain in right toe(s): Secondary | ICD-10-CM

## 2014-10-04 DIAGNOSIS — M79676 Pain in unspecified toe(s): Secondary | ICD-10-CM | POA: Diagnosis not present

## 2014-10-04 NOTE — Therapy (Signed)
[place] MAIN [facility] SERVICES [address] [place], [place], [address] Phone: [phone]   Fax:  [phone]  Physical Therapy Treatment  Patient Details  Name: Kimberly Delgado MRN: [id number] Date of Birth: November 09, 1985 Referring Provider:  No ref. provider found  Encounter Date: 10/04/2014      PT End of Session - 10/04/14 0907    Visit Number 3   Number of Visits 16   Date for PT Re-Evaluation 10/19/14   PT Start Time 0802   PT Stop Time 0845   PT Time Calculation (min) 43 min   Activity Tolerance Patient tolerated treatment well   Behavior During Therapy [facility] for tasks assessed/performed      Past Medical History  Diagnosis Date  . Anxiety     Past Surgical History  Procedure Laterality Date  . Wisdom tooth extraction N/A     There were no vitals filed for this visit.  Visit Diagnosis:  Pain of toe of right foot  Abnormality of gait      Subjective Assessment - 10/04/14 0903    Subjective pt reports she is able to do some more activity and continues to do lunges. she reports trying to do some directional running with her traininer but this was painful. she reported she was able to work out after last PT session with minimal pain    Patient Stated Goals get back to running nad PLOF without toe pain.    Currently in Pain? Yes   Pain Score 2    Pain Location --  R great toe   Pain Orientation Right   Pain Descriptors / Indicators Aching                         [facility] Adult PT Treatment/Exercise - 10/04/14 0905    Exercises   Exercises Ankle   Manual Therapy   Manual Therapy Joint mobilization;Massage;Other (comment)  Trigger point dry needling   Joint Mobilization PT performed grade 3 joint mobs to mid foot and 1MTP jonit AP and PT 30 s each    Massage STM and AISTM with edge tool preformed to pts R calf in prone and plantar fascia. include efflurage and muscle stripping  crepitus noted in  plantar fascia, TrP in med calf and foot   Other Manual Therapy    Ankle Exercises: Stretches   Plantar Fascia Stretch 2 reps;20 seconds   Soleus Stretch 2 reps;20 seconds   Gastroc Stretch 2 reps;20 seconds    pt had less muscle tightness  And trigger points palpable in muscle.   jog on trampoline x 2 min 3 step hold SLS on trampoline 2x  2 min Bounding onto trampoline forward x 10 Side step downs 6inch step x 10 each leg sLS on AIREX with 20deg knee flexion LLE x  29min Pt required cues for hip/knee alignment, some hip drop/trendelenberg noted during SL activities.                 PT Long Term Goals - 09/28/14 1043    PT LONG TERM GOAL #1   Title pt will tolerate jumping using bilateral :LEs without symptom reproduction   Time 6   Period Weeks   Status New   PT LONG TERM GOAL #2   Title pt will tolerate running greater than 3 minutes without symptom reproduction    Time 8   Period Weeks   Status New   PT LONG TERM GOAL #  3   Title pt will return to prior work activities for 8 hours without increased in symptoms   Time 8   Period Weeks   Status New   PT LONG TERM GOAL #4   Title pt will be able to return to prior exercise routine without increase in symtpoms.    Time 8   Period Weeks   Status New               Plan - 10/04/14 0907    Clinical Impression Statement pt did well with progression of acvities which included ankle/knee/hip stability and modified impact exercises to hopefully begin to return to PLOF including short jogs. pt instructed on gradual increase in activity including jogging in the pool or on a trampoline. pt had very mild soreness, but did fine. pt does demo impaired quad/hip weakness R>L during single leg activities.    Pt will benefit from skilled therapeutic intervention in order to improve on the following deficits Abnormal gait;Decreased activity tolerance;Decreased balance;Pain;Decreased strength;Decreased range of motion;Difficulty  walking;Impaired flexibility   Rehab Potential Good   PT Frequency 2x / week   PT Duration 8 weeks   PT Next Visit Plan add 4 way hip   Consulted and Agree with Plan of Care Patient        Problem List There are no active problems to display for this patient.  Gorden Harms. Johanna Matto, PT, DPT [phone]  Trevaughn Schear 10/04/2014, 9:10 AM  [place] MAIN [facility] SERVICES [address] [place], [place], [address] Phone: [phone]   Fax:  [phone]

## 2014-10-04 NOTE — Patient Instructions (Signed)
[email or web address] Pt issued lateral step downs 3x10, SLS on pillow with or without ball x 56min at a time, trampoline jog, SLS bouding PRN during workouts

## 2014-10-06 ENCOUNTER — Ambulatory Visit: Payer: 59 | Attending: Podiatry

## 2014-10-06 DIAGNOSIS — R269 Unspecified abnormalities of gait and mobility: Secondary | ICD-10-CM

## 2014-10-06 DIAGNOSIS — M79676 Pain in unspecified toe(s): Secondary | ICD-10-CM | POA: Diagnosis not present

## 2014-10-06 DIAGNOSIS — M79674 Pain in right toe(s): Secondary | ICD-10-CM

## 2014-10-06 NOTE — Patient Instructions (Signed)
[email or web address] Standing resisted hip flexion, abduction ,adduction and extension with        Band x 15 each way

## 2014-10-06 NOTE — Therapy (Signed)
[place] MAIN [facility] SERVICES [address] [place], [place], [address] Phone: [phone]   Fax:  [phone]  Physical Therapy Treatment  Patient Details  Name: Kimberly Delgado MRN: [id number] Date of Birth: 13-Mar-1986 Referring Provider:  Wallene Huh, DPM  Encounter Date: 10/06/2014      PT End of Session - 10/06/14 0902    Visit Number 4   Number of Visits 16   Date for PT Re-Evaluation 10/19/14   PT Start Time 0805   PT Stop Time 0855   PT Time Calculation (min) 50 min   Activity Tolerance Patient tolerated treatment well  reported mild R calf soreness following TrP DN   Behavior During Therapy [facility] for tasks assessed/performed      Past Medical History  Diagnosis Date  . Anxiety     Past Surgical History  Procedure Laterality Date  . Wisdom tooth extraction N/A     There were no vitals filed for this visit.  Visit Diagnosis:  Pain of toe of right foot  Abnormality of gait      Subjective Assessment - 10/06/14 0857    Subjective pt reports she has been working out, still lower impact with relatively little pain. she did try to wear flats to work yesterday vs sneakers and reports she had quite a bit of pain, but is resolved today.   Patient Stated Goals get back to running nad PLOF without toe pain.    Currently in Pain? No/denies   Pain Score 0-No pain       Treatment/objective: Therex: Nustep no charge warm up x 2 min L5 Leg press with dyna disc 90lbs RLE only 3x10 BOSU squat hold 5s x 10 flat top down BOSU SLS on flat top 30s x 4 each leg Standing resisted hip flexion, abduction ,adduction and extension with   red     Band x 15 each way  Manual therapy: STM and AISTM using edge tool to R calf and plantar fascia Patient received dry needling therapy education and acknowledged understanding of risks and benefits of dry needling therapy prior to receiving treatment. Patient voiced understanding of  treatment options and elected to proceed with dry needling therapy.  Trigger point dry needling to medial calf at 2 sites on the R leg: Latent twitch response noted at both areas with appropriate patient response. Followed this with gastroc and soleus stretch 1 min each                                PT Long Term Goals - 09/28/14 1043    PT LONG TERM GOAL #1   Title pt will tolerate jumping using bilateral :LEs without symptom reproduction   Time 6   Period Weeks   Status New   PT LONG TERM GOAL #2   Title pt will tolerate running greater than 3 minutes without symptom reproduction    Time 8   Period Weeks   Status New   PT LONG TERM GOAL #3   Title pt will return to prior work activities for 8 hours without increased in symptoms   Time 8   Period Weeks   Status New   PT LONG TERM GOAL #4   Title pt will be able to return to prior exercise routine without increase in symtpoms.    Time 8   Period Weeks   Status New  Plan - 10/06/14 0903    Clinical Impression Statement pt did well again with stability/hip strengthening exercises. does show marked reduction in stability in the RLE v sthe L. did not have pain with exercise today, only muscle fatigue.    Pt will benefit from skilled therapeutic intervention in order to improve on the following deficits Abnormal gait;Decreased activity tolerance;Decreased balance;Pain;Decreased strength;Decreased range of motion;Difficulty walking;Impaired flexibility   Rehab Potential Good   PT Frequency 2x / week   PT Duration 8 weeks   Consulted and Agree with Plan of Care Patient        Problem List There are no active problems to display for this patient.  Gorden Harms. Kortney Potvin, PT, DPT [phone]  Jaydy Fitzhenry 10/06/2014, 9:09 AM  [place] MAIN [facility] SERVICES [address] [place], [place], [address] Phone: [phone]   Fax:   [phone]

## 2014-10-11 ENCOUNTER — Ambulatory Visit: Payer: 59

## 2014-10-11 DIAGNOSIS — M79674 Pain in right toe(s): Secondary | ICD-10-CM

## 2014-10-11 DIAGNOSIS — R269 Unspecified abnormalities of gait and mobility: Secondary | ICD-10-CM

## 2014-10-11 DIAGNOSIS — M79676 Pain in unspecified toe(s): Secondary | ICD-10-CM | POA: Diagnosis not present

## 2014-10-11 NOTE — Patient Instructions (Signed)
Continue HEP, stretch periodically daily while in [place]- progression of impact exercises including trampoline, jogging in [employer]

## 2014-10-11 NOTE — Therapy (Signed)
[place] MAIN [facility] SERVICES [address] [place], [place], [address] Phone: [phone]   Fax:  [phone]  Physical Therapy Treatment  Patient Details  Name: Lorna Strother MRN: [id number] Date of Birth: [birth year] Referring Provider:  Wallene Huh, DPM  Encounter Date: 10/11/2014      PT End of Session - 10/11/14 0845    Visit Number 5   Number of Visits 16   Date for PT Re-Evaluation 10/19/14   PT Start Time 0803   PT Stop Time 0840   PT Time Calculation (min) 37 min   Activity Tolerance Patient tolerated treatment well  reported mild R calf soreness following TrP DN   Behavior During Therapy [facility] for tasks assessed/performed      Past Medical History  Diagnosis Date  . Anxiety     Past Surgical History  Procedure Laterality Date  . Wisdom tooth extraction N/A     There were no vitals filed for this visit.  Visit Diagnosis:  Pain of toe of right foot  Abnormality of gait      Subjective Assessment - 10/11/14 0843    Subjective pt reports her toe has been more sore the last 2 days, but her  calf feels looser and had overall less pain in general. pt going to [place] for 2 weeks and plans to do a lot of walking. pt is compliant with HEP   Patient Stated Goals get back to running nad PLOF without toe pain.    Pain Score 3    Pain Location --  great toe R      Manual therapy: R great toe DIP and PIP joint distraction and AP/PA joint mobs grade 2-3 , 3 bouts of 20s each Mid foot joint mobilization, generalized, grade 2-3, 3 bouts of 20s each STM to R calf and plantar fascia - efflurage, muscle rollling, muscle stripping x 12 min Pt demonstrates less tightness/tone in the R calf, less crepitus in the plantar fascia.                                 PT Long Term Goals - 10/11/14 0848    PT LONG TERM GOAL #1   Title pt will tolerate jumping using bilateral :LEs without symptom  reproduction   Time 6   Period Weeks   Status Partially Met   PT LONG TERM GOAL #2   Title pt will tolerate running greater than 3 minutes without symptom reproduction    Time 8   Period Weeks   Status Partially Met   PT LONG TERM GOAL #3   Title pt will return to prior work activities for 8 hours without increased in symptoms   Time 8   Period Weeks   Status Partially Met   PT LONG TERM GOAL #4   Title pt will be able to return to prior exercise routine without increase in symtpoms.    Time 8   Period Weeks   Status Partially Met               Plan - 10/11/14 0845    Clinical Impression Statement pt has partially met goals at this time, she has less pain in the toe and more strength, however still cannot tolerate prolonged walking, or imact exercises. pt recommended to continue HEP and gradually increasing impact exercises over the next 6-8 weeks and if pain continues consider second opinion  from podiatrist as there dose seem to be a soft tissue injury. pt will be placed on Hold from PT at this time pending her pain/symptoms following return from her trip.    Pt will benefit from skilled therapeutic intervention in order to improve on the following deficits Abnormal gait;Decreased activity tolerance;Decreased balance;Pain;Decreased strength;Decreased range of motion;Difficulty walking;Impaired flexibility   Rehab Potential Good   PT Frequency 2x / week   PT Duration 8 weeks   Consulted and Agree with Plan of Care Patient        Problem List There are no active problems to display for this patient.  Gorden Harms. Neftali Abair, PT, DPT [phone]  Katai Marsico 10/11/2014, 8:50 AM  [place] MAIN [facility] SERVICES [address] [place], [place], [address] Phone: [phone]   Fax:  [phone]

## 2015-01-10 LAB — OB RESULTS CONSOLE HEPATITIS B SURFACE ANTIGEN: HEP B S AG: NEGATIVE

## 2015-01-10 LAB — OB RESULTS CONSOLE ABO/RH: RH TYPE: NEGATIVE

## 2015-01-10 LAB — OB RESULTS CONSOLE RPR: RPR: NONREACTIVE

## 2015-01-10 LAB — OB RESULTS CONSOLE RUBELLA ANTIBODY, IGM: Rubella: IMMUNE

## 2015-01-10 LAB — OB RESULTS CONSOLE HIV ANTIBODY (ROUTINE TESTING): HIV: NONREACTIVE

## 2015-01-10 LAB — OB RESULTS CONSOLE ANTIBODY SCREEN: Antibody Screen: NEGATIVE

## 2015-01-23 LAB — OB RESULTS CONSOLE GC/CHLAMYDIA
Chlamydia: NEGATIVE
Gonorrhea: NEGATIVE

## 2015-02-02 ENCOUNTER — Other Ambulatory Visit: Payer: Self-pay | Admitting: Obstetrics and Gynecology

## 2015-02-06 ENCOUNTER — Encounter (HOSPITAL_COMMUNITY): Payer: Self-pay | Admitting: *Deleted

## 2015-02-17 ENCOUNTER — Encounter (HOSPITAL_COMMUNITY)
Admission: RE | Disposition: A | Discharge status: Home / Self Care | Payer: Self-pay | Source: Ambulatory Visit | Attending: Obstetrics and Gynecology

## 2015-02-17 ENCOUNTER — Ambulatory Visit (HOSPITAL_COMMUNITY)
Admission: RE | Admit: 2015-02-17 | Discharge: 2015-02-17 | Disposition: A | Discharge status: Home / Self Care | Payer: 59 | Source: Ambulatory Visit | Attending: Obstetrics and Gynecology | Admitting: Obstetrics and Gynecology

## 2015-02-17 ENCOUNTER — Ambulatory Visit (HOSPITAL_COMMUNITY): Payer: 59 | Admitting: Anesthesiology

## 2015-02-17 ENCOUNTER — Encounter (HOSPITAL_COMMUNITY): Payer: Self-pay | Admitting: Anesthesiology

## 2015-02-17 DIAGNOSIS — J45909 Unspecified asthma, uncomplicated: Secondary | ICD-10-CM | POA: Diagnosis not present

## 2015-02-17 DIAGNOSIS — O3432 Maternal care for cervical incompetence, second trimester: Secondary | ICD-10-CM | POA: Diagnosis not present

## 2015-02-17 DIAGNOSIS — O99512 Diseases of the respiratory system complicating pregnancy, second trimester: Secondary | ICD-10-CM | POA: Insufficient documentation

## 2015-02-17 DIAGNOSIS — O09212 Supervision of pregnancy with history of pre-term labor, second trimester: Secondary | ICD-10-CM | POA: Diagnosis not present

## 2015-02-17 DIAGNOSIS — Z3A14 14 weeks gestation of pregnancy: Secondary | ICD-10-CM | POA: Diagnosis not present

## 2015-02-17 DIAGNOSIS — N883 Incompetence of cervix uteri: Secondary | ICD-10-CM | POA: Diagnosis present

## 2015-02-17 DIAGNOSIS — O3431 Maternal care for cervical incompetence, first trimester: Secondary | ICD-10-CM

## 2015-02-17 HISTORY — PX: CERVICAL CERCLAGE: SHX1329

## 2015-02-17 HISTORY — DX: Major depressive disorder, single episode, unspecified: F32.9

## 2015-02-17 HISTORY — DX: Depression, unspecified: F32.A

## 2015-02-17 LAB — CBC
HCT: 37.3 % (ref 36.0–46.0)
Hemoglobin: 12.8 g/dL (ref 12.0–15.0)
MCH: 28.9 pg (ref 26.0–34.0)
MCHC: 34.3 g/dL (ref 30.0–36.0)
MCV: 84.2 fL (ref 78.0–100.0)
PLATELETS: 242 10*3/uL (ref 150–400)
RBC: 4.43 MIL/uL (ref 3.87–5.11)
RDW: 13.6 % (ref 11.5–15.5)
WBC: 15.7 10*3/uL — AB (ref 4.0–10.5)

## 2015-02-17 SURGERY — CERCLAGE, CERVIX, VAGINAL APPROACH
Anesthesia: Spinal

## 2015-02-17 MED ORDER — FENTANYL CITRATE (PF) 100 MCG/2ML IJ SOLN: 25.0000 ug | INTRAMUSCULAR | Status: DC | PRN

## 2015-02-17 MED ORDER — PHENYLEPHRINE HCL 10 MG/ML IJ SOLN: INTRAMUSCULAR | Status: DC | PRN

## 2015-02-17 MED ORDER — EPHEDRINE SULFATE 50 MG/ML IJ SOLN
INTRAMUSCULAR | Status: DC | PRN
  Administered 2015-02-17 (×2): 5 mg via INTRAVENOUS

## 2015-02-17 MED ORDER — PROMETHAZINE HCL 25 MG/ML IJ SOLN
6.2500 mg | INTRAMUSCULAR | Status: DC | PRN
Start: 2015-02-17 — End: 2015-02-17

## 2015-02-17 MED ORDER — LACTATED RINGERS IV SOLN
INTRAVENOUS | Status: DC
  Administered 2015-02-17 (×2): via INTRAVENOUS

## 2015-02-17 MED ORDER — BUPIVACAINE IN DEXTROSE 0.75-8.25 % IT SOLN
INTRATHECAL | Status: DC | PRN
  Administered 2015-02-17: 1.2 mL via INTRATHECAL

## 2015-02-17 MED ORDER — PHENYLEPHRINE 40 MCG/ML (10ML) SYRINGE FOR IV PUSH (FOR BLOOD PRESSURE SUPPORT)
PREFILLED_SYRINGE | INTRAVENOUS | Status: AC
  Filled 2015-02-17: qty 10

## 2015-02-17 MED ORDER — EPHEDRINE 5 MG/ML INJ
INTRAVENOUS | Status: AC
  Filled 2015-02-17: qty 10

## 2015-02-17 MED ORDER — 0.9 % SODIUM CHLORIDE (POUR BTL) OPTIME
TOPICAL | Status: DC | PRN
  Administered 2015-02-17: 1000 mL

## 2015-02-17 SURGICAL SUPPLY — 21 items
CATH FOLEY 2WAY SLVR 30CC 16FR (CATHETERS) IMPLANT
CLOTH BEACON ORANGE TIMEOUT ST (SAFETY) ×3 IMPLANT
COUNTER NEEDLE 1200 MAGNETIC (NEEDLE) ×3 IMPLANT
GLOVE BIOGEL PI IND STRL 7.0 (GLOVE) ×2 IMPLANT
GLOVE BIOGEL PI INDICATOR 7.0 (GLOVE) ×4
GLOVE ECLIPSE 6.5 STRL STRAW (GLOVE) ×3 IMPLANT
GOWN STRL REUS W/TWL LRG LVL3 (GOWN DISPOSABLE) ×6 IMPLANT
NS IRRIG 1000ML POUR BTL (IV SOLUTION) ×3 IMPLANT
PACK VAGINAL MINOR WOMEN LF (CUSTOM PROCEDURE TRAY) ×3 IMPLANT
PAD OB MATERNITY 4.3X12.25 (PERSONAL CARE ITEMS) ×3 IMPLANT
PAD PREP 24X48 CUFFED NSTRL (MISCELLANEOUS) ×3 IMPLANT
SUT ETHIBOND  5 (SUTURE) ×2
SUT ETHIBOND 5 (SUTURE) ×1 IMPLANT
SUT PROLENE 1 CTX 30  8455H (SUTURE) ×2
SUT PROLENE 1 CTX 30 8455H (SUTURE) ×1 IMPLANT
SYR BULB IRRIGATION 50ML (SYRINGE) ×3 IMPLANT
TOWEL OR 17X24 6PK STRL BLUE (TOWEL DISPOSABLE) ×6 IMPLANT
TRAY FOLEY CATH SILVER 14FR (SET/KITS/TRAYS/PACK) ×3 IMPLANT
TUBING NON-CON 1/4 X 20 CONN (TUBING) IMPLANT
TUBING NON-CON 1/4 X 20' CONN (TUBING)
YANKAUER SUCT BULB TIP NO VENT (SUCTIONS) IMPLANT

## 2015-02-17 NOTE — H&P (Signed)
Kimberly Delgado is a 29 y.o. female presenting @ 14 2/7 WEEKS  For cerclage placement due to cervical incompetence. Hx PTB @ 24 weeks Maternal Medical History:  Fetal activity: Perceived fetal activity is none.    Prenatal complications: no prenatal complications   OB; [id number] Past Medical History  Diagnosis Date  . UTI (urinary tract infection)     last one was in 2007  . Pyelonephritis, acute   . Seasonal allergies   . Preterm delivery (10/7) 03/03/2013  . Postpartum care following vaginal delivery (10/7) 03/03/2013  . SVD (spontaneous vaginal delivery)     x 1  . Asthma     exercised induced asthma - rarely uses inhaler  . Depression     hsitory - no meds currently   Past Surgical History  Procedure Laterality Date  . Wisdom tooth extraction     Family History: family history includes Diabetes in her mother and other. Social History:  reports that she has never smoked. She has never used smokeless tobacco. She reports that she drinks alcohol. She reports that she does not use illicit drugs.   Prenatal Transfer Tool  Review of Systems  All other systems reviewed and are negative.     Height 5\' 7"  (1.702 m), weight 68.04 kg (150 lb), last menstrual period 11/09/2014, unknown if currently breastfeeding. Maternal Exam:  Introitus: Normal vulva.   Physical Exam  Constitutional: She is oriented to person, place, and time. She appears well-developed and well-nourished.  HENT:  Head: Atraumatic.  Eyes: EOM are normal.  Neck: Neck supple.  Cardiovascular: Regular rhythm.   Respiratory: Effort normal and breath sounds normal.  GI: Soft.  Neurological: She is alert and oriented to person, place, and time.  Skin: Skin is warm and dry.  Psychiatric: She has a normal mood and affect.    Prenatal labs: ABO, Rh:  O neg Antibody:  neg Rubella:  Immune RPR:   nr HBsAg:   neg HIV:   nr GBS:   not app  Assessment/Plan: Cervical incompetence IUP @ 14 2/7  weeks Hx PTB P) cerclage placement. Risk reviewed including infection, bleeding, injury to surrounding organ structures  Kimberly Delgado A 02/17/2015, 8:01 AM

## 2015-02-17 NOTE — Op Note (Signed)
NAMEJAYSIE, Kimberly Delgado     ACCOUNT NO.:  [id number]  MEDICAL RECORD NO.:  [id number]  LOCATION:  [facility]                          FACILITY:  [facility]  PHYSICIAN:  Servando Salina, M.D.DATE OF BIRTH:  Jun 09, 1985  DATE OF PROCEDURE:  02/17/2015 DATE OF DISCHARGE:  02/17/2015                              OPERATIVE REPORT   PREOPERATIVE DIAGNOSES: 1. Cervical incompetence. 2. History of preterm birth. 3. Intrauterine gestation at 42 and 2/7th weeks.  POSTOP DIAGNOSIS: 1. Cervical incompetence. 2. History of preterm birth. 3. Intrauterine gestation at 21 and 2/7th weeks.  PROCEDURE:  McDonald cervical cerclage.  ANESTHESIA:  Spinal.  SURGEON:  Servando Salina, M.D.  ASSISTANT:  None.  DESCRIPTION OF PROCEDURE:  Under adequate spinal anesthesia, the patient was placed in a dorsal lithotomy position.  She was sterilely prepped and draped in usual fashion.  The bladder had an indwelling Foley catheter placed.  A weighted speculum and Sims retractor was placed in the vagina.  Vagina was irrigated with sterile water.  The cervix was grasped with small clamps anteriorly and posteriorly  and using #1 Prolene, starting at the 12 o'clock position, a McDonald cerclage was placed in usual fashion, tagged with Ethibond tape.  The cervix remained closed.  All instruments were then removed from he vagina.  SPECIMEN:  None.  ESTIMATED BLOOD LOSS:  Minimal.  COMPLICATIONS:  None.  The patient tolerated the procedure well and was transferred to recovery in stable condition. FHR check in PACU      Servando Salina, M.D.     [place]/MEDQ  D:  02/17/2015  T:  02/17/2015  Job:  [id number]

## 2015-02-17 NOTE — Discharge Instructions (Signed)
Call if severe abdominal cramping, menstrual bleeding  Cervical Cerclage, Care After Refer to this sheet in the next few weeks. These instructions provide you with information on caring for yourself after your procedure. Your health care provider may also give you more specific instructions. Your treatment has been planned according to current medical practices, but problems sometimes occur. Call your health care provider if you have any problems or questions after your procedure. WHAT TO EXPECT AFTER THE PROCEDURE  After your procedure, it is typical to have the following:  Abdominal cramping.  Vaginal spotting. HOME CARE INSTRUCTIONS   Only take over-the-counter or prescription medicines for pain, discomfort, or fever as directed by your health care provider.  Avoid physical activities and exercise until your health care provider says it is okay.  Do not douche or have sexual intercourse until your health care provider tells you it is okay.  Keep your follow-up surgical and prenatal appointments with your health care provider. SEEK MEDICAL CARE IF:   You have abnormal vaginal discharge.  You have a rash.  You become lightheaded or feel faint.  You have abdominal pain that is not controlled with pain medicine. SEEK IMMEDIATE MEDICAL CARE IF:   You develop vaginal bleeding.  You are leaking fluid or have a gush of fluid from the vagina.  You have a fever.  You faint.  You have uterine contractions.  You feel your baby is not moving as much as usual, or you cannot feel your baby move.  You have chest pain or shortness of breath. Document Released: 03/03/2013 Document Revised: 05/18/2013 Document Reviewed: 03/03/2013 [facility] Patient Information 2015 [place], [place]. This information is not intended to replace advice given to you by your health care provider. Make sure you discuss any questions you have with your health care provider.

## 2015-02-17 NOTE — Anesthesia Procedure Notes (Signed)
Spinal Patient location during procedure: OR Staffing Anesthesiologist: Catalina Gravel Performed by: anesthesiologist  Preanesthetic Checklist Completed: patient identified, surgical consent, pre-op evaluation, timeout performed, IV checked, risks and benefits discussed and monitors and equipment checked Spinal Block Patient position: sitting Prep: site prepped and draped and DuraPrep Patient monitoring: continuous pulse ox and blood pressure Approach: midline Location: L3-4 Needle Needle type: Pencan  Needle gauge: 24 G Needle length: 9 cm Additional Notes Functioning IV was confirmed and monitors were applied. Sterile prep and drape, including hand hygiene, mask and sterile gloves were used. The patient was positioned and the spine was prepped. The skin was anesthetized with lidocaine.  Free flow of clear CSF was obtained prior to injecting local anesthetic into the CSF.  The spinal needle aspirated freely following injection.  The needle was carefully withdrawn.  The patient tolerated the procedure well. Consent was obtained prior to procedure with all questions answered and concerns addressed. Risks including but not limited to bleeding, infection, nerve damage, paralysis, failed block, inadequate analgesia, allergic reaction, high spinal, itching and headache were discussed and the patient wished to proceed.   Kimberly Morn, MD

## 2015-02-17 NOTE — Brief Op Note (Signed)
02/17/2015  1:54 PM  PATIENT:  Merril Abbe  29 y.o. female  PRE-OPERATIVE DIAGNOSIS:  Cervical Incompetence, IUP @ 46 2/7 weeks, hx preterm birth  POST-OPERATIVE DIAGNOSIS:  Cervical Incompetence, IUP @ 14 2/7 weeks, hx PTB  PROCEDURE:  McDonald cervical cerclage  SURGEON:  Surgeon(s) and Role:    * Servando Salina, MD - Primary  PHYSICIAN ASSISTANT:   ASSISTANTS: none   ANESTHESIA:   spinal  EBL:  Total I/O In: 1700 [I.V.:1700] Out: 250 [Urine:250]  BLOOD ADMINISTERED:none  DRAINS: none   LOCAL MEDICATIONS USED:  NONE  SPECIMEN:  No Specimen  DISPOSITION OF SPECIMEN:  N/A  COUNTS:  YES  TOURNIQUET:  * No tourniquets in log *  DICTATION: .Other Dictation: Dictation Number [id number]  PLAN OF CARE: Discharge to home after PACU  PATIENT DISPOSITION:  PACU - hemodynamically stable.   Delay start of Pharmacological VTE agent (>24hrs) due to surgical blood loss or risk of bleeding: no

## 2015-02-17 NOTE — Anesthesia Postprocedure Evaluation (Signed)
  Anesthesia Post-op Note  Patient: Kimberly Delgado  Procedure(s) Performed: Procedure(s) with comments: CERCLAGE CERVICAL (N/A) - EDD: 08/16/15  Patient Location: PACU  Anesthesia Type:Spinal  Level of Consciousness: awake, alert  and oriented  Airway and Oxygen Therapy: Patient Spontanous Breathing  Post-op Pain: none  Post-op Assessment: Post-op Vital signs reviewed, Patient's Cardiovascular Status Stable, Respiratory Function Stable, Patent Airway, No signs of Nausea or vomiting, Pain level controlled, No headache, No backache, Spinal receding and Patient able to bend at knees LLE Motor Response: Purposeful movement LLE Sensation: Increased RLE Motor Response: Purposeful movement RLE Sensation: Increased      Post-op Vital Signs: Reviewed and stable  Last Vitals:  Filed Vitals:   02/17/15 1536  BP:   Pulse:   Temp: 36.8 C  Resp:     Complications: No apparent anesthesia complications

## 2015-02-17 NOTE — Transfer of Care (Signed)
Immediate Anesthesia Transfer of Care Note  Patient: Kimberly Delgado  Procedure(s) Performed: Procedure(s) with comments: CERCLAGE CERVICAL (N/A) - EDD: 08/16/15  Patient Location: PACU  Anesthesia Type:Spinal  Level of Consciousness: awake, alert  and oriented  Airway & Oxygen Therapy: Patient Spontanous Breathing  Post-op Assessment: Report given to RN and Post -op Vital signs reviewed and stable  Post vital signs: Reviewed and stable  Last Vitals:  Filed Vitals:   02/17/15 1149  BP: 104/71  Pulse: 71  Temp: 36.8 C  Resp: 16    Complications: No apparent anesthesia complications

## 2015-02-17 NOTE — Anesthesia Preprocedure Evaluation (Addendum)
Anesthesia Evaluation  Patient identified by MRN, date of birth, ID band Patient awake    Reviewed: Allergy & Precautions, NPO status , Patient's Chart, lab work & pertinent test results  History of Anesthesia Complications Negative for: history of anesthetic complications  Airway Mallampati: II  TM Distance: >3 FB Neck ROM: Full    Dental  (+) Teeth Intact, Dental Advisory Given   Pulmonary asthma ,    Pulmonary exam normal breath sounds clear to auscultation       Cardiovascular Exercise Tolerance: Good (-) hypertensionnegative cardio ROS Normal cardiovascular exam Rhythm:Regular Rate:Normal     Neuro/Psych PSYCHIATRIC DISORDERS Depression negative neurological ROS     GI/Hepatic negative GI ROS, Neg liver ROS,   Endo/Other  negative endocrine ROS  Renal/GU negative Renal ROS     Musculoskeletal negative musculoskeletal ROS (+)   Abdominal   Peds  Hematology negative hematology ROS (+)   Anesthesia Other Findings Day of surgery medications reviewed with the patient.  Reproductive/Obstetrics (+) Pregnancy                            Anesthesia Physical Anesthesia Plan  ASA: II  Anesthesia Plan: Spinal   Post-op Pain Management:    Induction:   Airway Management Planned:   Additional Equipment:   Intra-op Plan:   Post-operative Plan:   Informed Consent: I have reviewed the patients History and Physical, chart, labs and discussed the procedure including the risks, benefits and alternatives for the proposed anesthesia with the patient or authorized representative who has indicated his/her understanding and acceptance.   Dental advisory given  Plan Discussed with: CRNA, Anesthesiologist and Surgeon  Anesthesia Plan Comments: (Discussed risks and benefits of and differences between spinal and general. Discussed risks of spinal including headache, backache, failure, bleeding,  infection, and nerve damage. Patient consents to spinal. Questions answered. Coagulation studies and platelet count acceptable.)        Anesthesia Quick Evaluation

## 2015-02-20 ENCOUNTER — Encounter (HOSPITAL_COMMUNITY): Payer: Self-pay | Admitting: Obstetrics and Gynecology

## 2015-04-07 ENCOUNTER — Encounter (HOSPITAL_COMMUNITY): Payer: Self-pay | Admitting: Obstetrics and Gynecology

## 2015-05-13 ENCOUNTER — Inpatient Hospital Stay (HOSPITAL_COMMUNITY)
Admission: AD | Admit: 2015-05-13 | Discharge: 2015-05-14 | Disposition: A | Discharge status: Other Acute Inpatient Hospital | Payer: 59 | Source: Ambulatory Visit | Attending: Obstetrics and Gynecology | Admitting: Obstetrics and Gynecology

## 2015-05-13 ENCOUNTER — Inpatient Hospital Stay (HOSPITAL_COMMUNITY): Payer: 59

## 2015-05-13 ENCOUNTER — Encounter (HOSPITAL_COMMUNITY): Payer: Self-pay | Admitting: *Deleted

## 2015-05-13 DIAGNOSIS — Z3A26 26 weeks gestation of pregnancy: Secondary | ICD-10-CM | POA: Insufficient documentation

## 2015-05-13 DIAGNOSIS — O09292 Supervision of pregnancy with other poor reproductive or obstetric history, second trimester: Secondary | ICD-10-CM | POA: Insufficient documentation

## 2015-05-13 DIAGNOSIS — O26879 Cervical shortening, unspecified trimester: Secondary | ICD-10-CM

## 2015-05-13 LAB — URINALYSIS, ROUTINE W REFLEX MICROSCOPIC
Bilirubin Urine: NEGATIVE
GLUCOSE, UA: NEGATIVE mg/dL
Hgb urine dipstick: NEGATIVE
Ketones, ur: NEGATIVE mg/dL
LEUKOCYTES UA: NEGATIVE
Nitrite: NEGATIVE
PH: 6 (ref 5.0–8.0)
Protein, ur: NEGATIVE mg/dL

## 2015-05-13 NOTE — MAU Note (Signed)
Pt. States that she has been on bedrest because of shortening of the cervix at previous appt.

## 2015-05-13 NOTE — MAU Note (Signed)
Pt. States that she has been contracting all day.  They have slowly gotten closer together and lasting about 30 seconds. Pt. Denies bleeding or lof.

## 2015-05-14 ENCOUNTER — Encounter (HOSPITAL_COMMUNITY): Payer: Self-pay | Admitting: Obstetrics and Gynecology

## 2015-05-14 DIAGNOSIS — O09292 Supervision of pregnancy with other poor reproductive or obstetric history, second trimester: Secondary | ICD-10-CM | POA: Diagnosis not present

## 2015-05-14 DIAGNOSIS — Z3A26 26 weeks gestation of pregnancy: Secondary | ICD-10-CM | POA: Diagnosis not present

## 2015-05-14 DIAGNOSIS — Z3490 Encounter for supervision of normal pregnancy, unspecified, unspecified trimester: Secondary | ICD-10-CM | POA: Insufficient documentation

## 2015-05-14 LAB — WET PREP, GENITAL
CLUE CELLS WET PREP: NONE SEEN
SPERM: NONE SEEN
TRICH WET PREP: NONE SEEN
Yeast Wet Prep HPF POC: NONE SEEN

## 2015-05-14 MED ORDER — NIFEDIPINE 10 MG PO CAPS
10.0000 mg | ORAL_CAPSULE | Freq: Once | ORAL | Status: AC
  Administered 2015-05-14: 10 mg via ORAL
  Filled 2015-05-14: qty 1

## 2015-05-14 MED ORDER — LACTATED RINGERS IV SOLN
INTRAVENOUS | Status: DC
  Administered 2015-05-14: 05:00:00 via INTRAVENOUS

## 2015-05-14 MED ORDER — BETAMETHASONE SOD PHOS & ACET 6 (3-3) MG/ML IJ SUSP: 12.0000 mg | Freq: Once | INTRAMUSCULAR | Status: DC

## 2015-05-14 MED ORDER — INDOMETHACIN 25 MG PO CAPS
25.0000 mg | ORAL_CAPSULE | Freq: Four times a day (QID) | ORAL | Status: DC
  Administered 2015-05-14: 25 mg via ORAL
  Filled 2015-05-14 (×2): qty 1

## 2015-05-14 MED ORDER — MAGNESIUM SULFATE 50 % IJ SOLN
2.0000 g/h | INTRAMUSCULAR | Status: DC
  Administered 2015-05-14: 2 g/h via INTRAVENOUS
  Filled 2015-05-14: qty 80

## 2015-05-14 MED ORDER — MAGNESIUM SULFATE BOLUS VIA INFUSION
4.0000 g | Freq: Once | INTRAVENOUS | Status: AC
  Administered 2015-05-14: 4 g via INTRAVENOUS
  Filled 2015-05-14: qty 500

## 2015-05-14 MED ORDER — BETAMETHASONE SOD PHOS & ACET 6 (3-3) MG/ML IJ SUSP
12.0000 mg | Freq: Once | INTRAMUSCULAR | Status: AC
  Administered 2015-05-14: 12 mg via INTRAMUSCULAR
  Filled 2015-05-14: qty 2

## 2015-05-14 MED ORDER — NIFEDIPINE 10 MG PO CAPS: 10.0000 mg | ORAL_CAPSULE | ORAL | Status: DC | PRN

## 2015-05-14 NOTE — Progress Notes (Signed)
Patient stable for transfer to [facility] via [place]. Magnesium Sulfate 2 gm/hr infusing.  CEFM  FHR: 135 bpm / moderate variability / 10x10 accels present / decels absent TOCO: irregular UI lasting 30-50 secs  Graceann Congress MSN, CNM 05/14/2015 5:08 AM

## 2015-05-14 NOTE — MAU Note (Signed)
[facility] transport will be 61mins.[facility] called and unable to transport due to shortage of trucks. [facility] called back and will transport as soon as possible.

## 2015-05-14 NOTE — MAU Provider Note (Signed)
History     CSN: [id number]  Arrival date and time: 05/13/15 2247  Provider notified: 2332, [place] Provider on unit: 0113 Provider at bedside: [place]     Chief Complaint  Patient presents with  . Contractions   HPI  Ms. Kimberly Delgado is a 29 yo G2P0100 female at 26.[redacted] wks gestation by ultrasound, presenting with complaints BH contractions off and on all day that have now become more frequent (~every 8 mins).  She reports that the contractions are not painful, but abdomen is sore from the frequent tightening all day.  She has been hydrating well today.  She has been on bedrest for the past 2 wks, so she has had no increased activity today. She was last seen in the office Wednesday 12/14. CL was variable from 1.7 to 3.4 cm on 12/7. Last EFW was 1 lb 6oz at 23 wks. She reports (+) FM.  Her prenatal care is significant for: h/o of a PTD at 24 wks (resulting in a neonatal death), incompetent cervix, cerclage (which was placed at 13 wks with this pregnancy), weekly 17-P injections. Her OB provider at [facility] is Dr. Garwin Brothers.  Past Medical History  Diagnosis Date  . Anxiety   . UTI (urinary tract infection)     last one was in 2007  . Pyelonephritis, acute   . Seasonal allergies   . Preterm delivery (10/7) 03/03/2013  . Postpartum care following vaginal delivery (10/7) 03/03/2013  . SVD (spontaneous vaginal delivery)     x 1  . Asthma     exercised induced asthma - rarely uses inhaler  . Depression     hsitory - no meds currently    Past Surgical History  Procedure Laterality Date  . Wisdom tooth extraction    . Cervical cerclage N/A 02/17/2015    Procedure: CERCLAGE CERVICAL;  Surgeon: Servando Salina, MD;  Location: [facility];  Service: Gynecology;  Laterality: N/A;  EDD: 08/16/15    Family History  Problem Relation Age of Onset  . Diabetes Mother   . Diabetes Other     Social History  Substance Use Topics  . Smoking status: Never Smoker   . Smokeless tobacco:  Never Used  . Alcohol Use: Yes     Comment: social but none with pregnancy    Allergies: No Known Allergies  Prescriptions prior to admission  Medication Sig Dispense Refill Last Dose  . acetaminophen (TYLENOL) 325 MG tablet Take by mouth every 6 (six) hours as needed for mild pain, fever or headache.   Not Taking at Unknown time  . albuterol (PROVENTIL HFA;VENTOLIN HFA) 108 (90 BASE) MCG/ACT inhaler Inhale 2 puffs into the lungs every 6 (six) hours as needed for wheezing or shortness of breath (rescue).    rescue  . Prenatal Vit-Fe Fumarate-FA (PRENATAL MULTIVITAMIN) TABS tablet Take 1 tablet by mouth daily at 12 noon.   02/16/2015 at Unknown time    Review of Systems  Constitutional: Negative.   HENT: Negative.   Eyes: Negative.   Respiratory: Negative.   Cardiovascular: Negative.   Gastrointestinal: Positive for abdominal pain.       Abd pain from UC's  Genitourinary:       UC's every 8 mins before arrival to MAU; now closer and stronger  Musculoskeletal: Negative.   Skin: Negative.   Neurological: Negative.   Endo/Heme/Allergies: Negative.   Psychiatric/Behavioral: Negative.    Results for orders placed or performed during the hospital encounter of 05/13/15 (from the past 24  hour(s))  Urinalysis, Routine w reflex microscopic (not at [facility])     Status: Abnormal   Collection Time: 05/13/15 11:00 PM  Result Value Ref Range   Color, Urine YELLOW YELLOW   APPearance CLEAR CLEAR   Specific Gravity, Urine <1.005 (L) 1.005 - 1.030   pH 6.0 5.0 - 8.0   Glucose, UA NEGATIVE NEGATIVE mg/dL   Hgb urine dipstick NEGATIVE NEGATIVE   Bilirubin Urine NEGATIVE NEGATIVE   Ketones, ur NEGATIVE NEGATIVE mg/dL   Protein, ur NEGATIVE NEGATIVE mg/dL   Nitrite NEGATIVE NEGATIVE   Leukocytes, UA NEGATIVE NEGATIVE   OB Transvaginal U/S Cephalic presentation / anterior fundal placenta / AFI WNL / CL 0.6 cm, funneling at internal os noted, cerclage visualized  CEFM  FHR: 150 bpm / moderate  variability / accels present / decels absent TOCO: regular every 5-6 mins  Physical Exam   Blood pressure 97/52, pulse 83, temperature 97.9 F (36.6 C), temperature source Oral, resp. rate 16, height 5\' 7"  (1.702 m), weight 71.895 kg (158 lb 8 oz), last menstrual period 11/09/2014.  Physical Exam  Constitutional: She is oriented to person, place, and time. She appears well-developed and well-nourished.  HENT:  Head: Normocephalic.  Eyes: Pupils are equal, round, and reactive to light.  Neck: Normal range of motion.  Cardiovascular: Normal rate, regular rhythm, normal heart sounds and intact distal pulses.   Respiratory: Effort normal and breath sounds normal.  GI: Soft. Bowel sounds are normal.  Genitourinary:  SSE: copious amounts of thick, white, cottage cheese like vaginal d/c - WP done; no odor; no VB VE: intact cerclage palpated through cervix with knot at 3 o'clock position - no increased tension felt on knot; cervix closed, soft; difficult to determine presenting part.  Musculoskeletal: Normal range of motion.  Neurological: She is alert and oriented to person, place, and time. She has normal reflexes.  Skin: Skin is warm and dry.  Psychiatric: She has a normal mood and affect. Her behavior is normal. Judgment and thought content normal.    MAU Course  Procedures CCUA Urine Culture CEFM TV U/S for CL Procardia 10 mg po x 1 BMZ 12 mg IM injection LR IV Magnesium Sulfate 4 gm loading dose, then 2 gm every hour SSE VE Assessment and Plan  29 yo G2P0100 at 26.[redacted] wks gestation Preterm Labor Cerclage H/O Preterm Birth at 55 wks H/O Neonatal Death d/t prematurity  Transfer to [facility]  Transfer orders and facilitation of transfer managed by Dr. Benjie Karvonen  *Unable to reach Dr. Garwin Brothers / Dr. Benjie Karvonen notified of pt status, assessment and plan    Graceann Congress MSN, CNM 05/14/2015, 1:30 AM

## 2015-05-14 NOTE — Progress Notes (Signed)
Pt dozing.

## 2015-05-14 NOTE — Progress Notes (Signed)
Pt transferred with [facility] to [facility] L&D. Spouse followed in private vehicle

## 2015-05-14 NOTE — Progress Notes (Signed)
Pt able to sleep thru contractions

## 2015-05-15 LAB — CULTURE, OB URINE
Culture: 2000
Special Requests: NORMAL

## 2015-05-28 NOTE — L&D Delivery Note (Signed)
Delivery Note At 3:09 AM a viable and healthy female was delivered via  (Presentation: OA).  APGAR: 9, 9; weight -pending .   Placenta status: spontaneous, intact, .  Cord:  with the following complications: around shoulder and wrist.  Cord pH: N/A  Anesthesia: Epidural  Episiotomy: None Lacerations: Labial bilateral  Suture Repair: 3.0 vicryl rapide Est. Blood Loss (mL): 100 cc  Mom to postpartum.  Baby to Couplet care / Skin to Skin.  Anisten Tomassi R 08/12/2015, 3:45 AM

## 2015-05-31 DIAGNOSIS — Z3A29 29 weeks gestation of pregnancy: Secondary | ICD-10-CM | POA: Diagnosis not present

## 2015-05-31 DIAGNOSIS — O26873 Cervical shortening, third trimester: Secondary | ICD-10-CM | POA: Diagnosis not present

## 2015-05-31 DIAGNOSIS — O3433 Maternal care for cervical incompetence, third trimester: Secondary | ICD-10-CM | POA: Diagnosis not present

## 2015-06-02 MED FILL — NIFEdipine 10 MG CAPS: 10 | 4 days supply | Qty: 28 | Fill #1

## 2015-06-07 DIAGNOSIS — O4703 False labor before 37 completed weeks of gestation, third trimester: Secondary | ICD-10-CM | POA: Diagnosis not present

## 2015-06-07 DIAGNOSIS — O26873 Cervical shortening, third trimester: Secondary | ICD-10-CM | POA: Diagnosis not present

## 2015-06-07 DIAGNOSIS — Z3A30 30 weeks gestation of pregnancy: Secondary | ICD-10-CM | POA: Diagnosis not present

## 2015-06-07 MED FILL — MAKENA 1,250 MG/5 ML VIAL: 250 | 35 days supply | Qty: 5 | Fill #3

## 2015-06-12 MED FILL — NIFEdipine 10 MG CAPS: 10 | 4 days supply | Qty: 28 | Fill #2

## 2015-06-14 DIAGNOSIS — Z3A31 31 weeks gestation of pregnancy: Secondary | ICD-10-CM | POA: Diagnosis not present

## 2015-06-14 DIAGNOSIS — O3433 Maternal care for cervical incompetence, third trimester: Secondary | ICD-10-CM | POA: Diagnosis not present

## 2015-06-14 DIAGNOSIS — B373 Candidiasis of vulva and vagina: Secondary | ICD-10-CM | POA: Diagnosis not present

## 2015-06-14 DIAGNOSIS — O09213 Supervision of pregnancy with history of pre-term labor, third trimester: Secondary | ICD-10-CM | POA: Diagnosis not present

## 2015-06-15 MED FILL — NYSTATIN/TRIAMCINOLONE CRM: 100000-0.1 | 20 days supply | Qty: 120 | Fill #0

## 2015-06-21 DIAGNOSIS — O3433 Maternal care for cervical incompetence, third trimester: Secondary | ICD-10-CM | POA: Diagnosis not present

## 2015-06-21 DIAGNOSIS — Z3A32 32 weeks gestation of pregnancy: Secondary | ICD-10-CM | POA: Diagnosis not present

## 2015-06-21 DIAGNOSIS — O09213 Supervision of pregnancy with history of pre-term labor, third trimester: Secondary | ICD-10-CM | POA: Diagnosis not present

## 2015-06-21 DIAGNOSIS — O4703 False labor before 37 completed weeks of gestation, third trimester: Secondary | ICD-10-CM | POA: Diagnosis not present

## 2015-06-23 MED FILL — NIFEdipine 10 MG CAPS: 10 | 4 days supply | Qty: 28 | Fill #3

## 2015-06-28 DIAGNOSIS — Z3A33 33 weeks gestation of pregnancy: Secondary | ICD-10-CM | POA: Diagnosis not present

## 2015-06-28 DIAGNOSIS — O3433 Maternal care for cervical incompetence, third trimester: Secondary | ICD-10-CM | POA: Diagnosis not present

## 2015-07-04 DIAGNOSIS — O3433 Maternal care for cervical incompetence, third trimester: Secondary | ICD-10-CM | POA: Diagnosis not present

## 2015-07-04 DIAGNOSIS — Z3A33 33 weeks gestation of pregnancy: Secondary | ICD-10-CM | POA: Diagnosis not present

## 2015-07-06 MED FILL — NIFEdipine 10 MG CAPS: 10 | 4 days supply | Qty: 28 | Fill #4

## 2015-07-12 DIAGNOSIS — Z36 Encounter for antenatal screening of mother: Secondary | ICD-10-CM | POA: Diagnosis not present

## 2015-07-12 DIAGNOSIS — Z3A35 35 weeks gestation of pregnancy: Secondary | ICD-10-CM | POA: Diagnosis not present

## 2015-07-12 DIAGNOSIS — O3433 Maternal care for cervical incompetence, third trimester: Secondary | ICD-10-CM | POA: Diagnosis not present

## 2015-07-12 LAB — OB RESULTS CONSOLE GBS: STREP GROUP B AG: NEGATIVE

## 2015-07-13 MED FILL — MAKENA 250 MG/ML VIAL: 250 | 7 days supply | Qty: 1 | Fill #0

## 2015-07-19 DIAGNOSIS — O3433 Maternal care for cervical incompetence, third trimester: Secondary | ICD-10-CM | POA: Diagnosis not present

## 2015-07-19 DIAGNOSIS — Z3A36 36 weeks gestation of pregnancy: Secondary | ICD-10-CM | POA: Diagnosis not present

## 2015-08-11 ENCOUNTER — Encounter (HOSPITAL_COMMUNITY): Payer: Self-pay | Admitting: Emergency Medicine

## 2015-08-11 ENCOUNTER — Inpatient Hospital Stay (HOSPITAL_COMMUNITY)
Admission: AD | Admit: 2015-08-11 | Discharge: 2015-08-14 | DRG: 775 | Disposition: A | Discharge status: Home / Self Care | Payer: 59 | Source: Ambulatory Visit | Attending: Obstetrics & Gynecology | Admitting: Obstetrics & Gynecology

## 2015-08-11 DIAGNOSIS — Z3A39 39 weeks gestation of pregnancy: Secondary | ICD-10-CM | POA: Diagnosis not present

## 2015-08-11 DIAGNOSIS — O3433 Maternal care for cervical incompetence, third trimester: Secondary | ICD-10-CM | POA: Diagnosis not present

## 2015-08-11 DIAGNOSIS — Z833 Family history of diabetes mellitus: Secondary | ICD-10-CM | POA: Diagnosis not present

## 2015-08-11 DIAGNOSIS — IMO0001 Reserved for inherently not codable concepts without codable children: Secondary | ICD-10-CM

## 2015-08-11 LAB — CBC
HEMATOCRIT: 37.1 % (ref 36.0–46.0)
Hemoglobin: 12.6 g/dL (ref 12.0–15.0)
MCH: 28.6 pg (ref 26.0–34.0)
MCHC: 34 g/dL (ref 30.0–36.0)
MCV: 84.1 fL (ref 78.0–100.0)
Platelets: 254 10*3/uL (ref 150–400)
RBC: 4.41 MIL/uL (ref 3.87–5.11)
RDW: 13.4 % (ref 11.5–15.5)
WBC: 12.9 10*3/uL — ABNORMAL HIGH (ref 4.0–10.5)

## 2015-08-11 MED ORDER — FENTANYL 2.5 MCG/ML BUPIVACAINE 1/10 % EPIDURAL INFUSION (WH - ANES)
14.0000 mL/h | INTRAMUSCULAR | Status: DC | PRN
  Administered 2015-08-12: 12 mL/h via EPIDURAL
  Administered 2015-08-12: 14 mL/h via EPIDURAL
  Filled 2015-08-11: qty 125

## 2015-08-11 MED ORDER — DIPHENHYDRAMINE HCL 50 MG/ML IJ SOLN: 12.5000 mg | INTRAMUSCULAR | Status: DC | PRN

## 2015-08-11 MED ORDER — OXYTOCIN 10 UNIT/ML IJ SOLN
2.5000 [IU]/h | INTRAMUSCULAR | Status: DC
  Filled 2015-08-11: qty 10

## 2015-08-11 MED ORDER — LACTATED RINGERS IV SOLN
500.0000 mL | Freq: Once | INTRAVENOUS | Status: AC
  Administered 2015-08-11: 500 mL via INTRAVENOUS

## 2015-08-11 MED ORDER — EPHEDRINE 5 MG/ML INJ: 10.0000 mg | INTRAVENOUS | Status: DC | PRN

## 2015-08-11 MED ORDER — CITRIC ACID-SODIUM CITRATE 334-500 MG/5ML PO SOLN: 30.0000 mL | ORAL | Status: DC | PRN

## 2015-08-11 MED ORDER — LACTATED RINGERS IV SOLN
INTRAVENOUS | Status: DC
  Administered 2015-08-11: 125 mL/h via INTRAVENOUS

## 2015-08-11 MED ORDER — OXYTOCIN BOLUS FROM INFUSION
500.0000 mL | INTRAVENOUS | Status: DC
  Administered 2015-08-12: 500 mL via INTRAVENOUS

## 2015-08-11 MED ORDER — ONDANSETRON HCL 4 MG/2ML IJ SOLN: 4.0000 mg | Freq: Four times a day (QID) | INTRAMUSCULAR | Status: DC | PRN

## 2015-08-11 MED ORDER — LACTATED RINGERS IV SOLN: 500.0000 mL | INTRAVENOUS | Status: DC | PRN

## 2015-08-11 MED ORDER — OXYCODONE-ACETAMINOPHEN 5-325 MG PO TABS: 2.0000 | ORAL_TABLET | ORAL | Status: DC | PRN

## 2015-08-11 MED ORDER — PHENYLEPHRINE 40 MCG/ML (10ML) SYRINGE FOR IV PUSH (FOR BLOOD PRESSURE SUPPORT): 80.0000 ug | PREFILLED_SYRINGE | INTRAVENOUS | Status: DC | PRN

## 2015-08-11 MED ORDER — FLEET ENEMA 7-19 GM/118ML RE ENEM: 1.0000 | ENEMA | RECTAL | Status: DC | PRN

## 2015-08-11 MED ORDER — LIDOCAINE HCL (PF) 1 % IJ SOLN
30.0000 mL | INTRAMUSCULAR | Status: DC | PRN
  Filled 2015-08-11: qty 30

## 2015-08-11 MED ORDER — PHENYLEPHRINE 40 MCG/ML (10ML) SYRINGE FOR IV PUSH (FOR BLOOD PRESSURE SUPPORT)
80.0000 ug | PREFILLED_SYRINGE | INTRAVENOUS | Status: DC | PRN
  Filled 2015-08-11: qty 20

## 2015-08-11 MED ORDER — ACETAMINOPHEN 325 MG PO TABS: 650.0000 mg | ORAL_TABLET | ORAL | Status: DC | PRN

## 2015-08-11 MED ORDER — OXYCODONE-ACETAMINOPHEN 5-325 MG PO TABS: 1.0000 | ORAL_TABLET | ORAL | Status: DC | PRN

## 2015-08-11 NOTE — MAU Note (Signed)
Having contractions since 2200. Bloody show. 3.5cm yesterday

## 2015-08-12 ENCOUNTER — Inpatient Hospital Stay (HOSPITAL_COMMUNITY): Payer: 59 | Admitting: Anesthesiology

## 2015-08-12 ENCOUNTER — Encounter (HOSPITAL_COMMUNITY): Payer: Self-pay | Admitting: Emergency Medicine

## 2015-08-12 LAB — CBC
HEMATOCRIT: 33.7 % — AB (ref 36.0–46.0)
Hemoglobin: 11.4 g/dL — ABNORMAL LOW (ref 12.0–15.0)
MCH: 28.4 pg (ref 26.0–34.0)
MCHC: 33.8 g/dL (ref 30.0–36.0)
MCV: 84 fL (ref 78.0–100.0)
PLATELETS: 227 10*3/uL (ref 150–400)
RBC: 4.01 MIL/uL (ref 3.87–5.11)
RDW: 13.3 % (ref 11.5–15.5)
WBC: 20.2 10*3/uL — AB (ref 4.0–10.5)

## 2015-08-12 LAB — RPR: RPR: NONREACTIVE

## 2015-08-12 LAB — TYPE AND SCREEN
ABO/RH(D): O NEG
Antibody Screen: NEGATIVE

## 2015-08-12 MED ORDER — WITCH HAZEL-GLYCERIN EX PADS: 1.0000 "application " | MEDICATED_PAD | CUTANEOUS | Status: DC | PRN

## 2015-08-12 MED ORDER — BENZOCAINE-MENTHOL 20-0.5 % EX AERO: 1.0000 "application " | INHALATION_SPRAY | CUTANEOUS | Status: DC | PRN

## 2015-08-12 MED ORDER — PRENATAL MULTIVITAMIN CH
1.0000 | ORAL_TABLET | Freq: Every day | ORAL | Status: DC
  Administered 2015-08-12 – 2015-08-14 (×3): 1 via ORAL
  Filled 2015-08-12 (×3): qty 1

## 2015-08-12 MED ORDER — ACETAMINOPHEN 325 MG PO TABS: 650.0000 mg | ORAL_TABLET | ORAL | Status: DC | PRN

## 2015-08-12 MED ORDER — BUPIVACAINE HCL (PF) 0.25 % IJ SOLN
INTRAMUSCULAR | Status: DC | PRN
  Administered 2015-08-12 (×2): 4 mL via EPIDURAL

## 2015-08-12 MED ORDER — ONDANSETRON HCL 4 MG/2ML IJ SOLN: 4.0000 mg | INTRAMUSCULAR | Status: DC | PRN

## 2015-08-12 MED ORDER — ONDANSETRON HCL 4 MG PO TABS: 4.0000 mg | ORAL_TABLET | ORAL | Status: DC | PRN

## 2015-08-12 MED ORDER — IBUPROFEN 600 MG PO TABS
600.0000 mg | ORAL_TABLET | Freq: Four times a day (QID) | ORAL | Status: DC
  Administered 2015-08-12 – 2015-08-14 (×10): 600 mg via ORAL
  Filled 2015-08-12 (×10): qty 1

## 2015-08-12 MED ORDER — SIMETHICONE 80 MG PO CHEW: 80.0000 mg | CHEWABLE_TABLET | ORAL | Status: DC | PRN

## 2015-08-12 MED ORDER — ALBUTEROL SULFATE (2.5 MG/3ML) 0.083% IN NEBU: 3.0000 mL | INHALATION_SOLUTION | Freq: Four times a day (QID) | RESPIRATORY_TRACT | Status: DC | PRN

## 2015-08-12 MED ORDER — SENNOSIDES-DOCUSATE SODIUM 8.6-50 MG PO TABS
2.0000 | ORAL_TABLET | ORAL | Status: DC
  Administered 2015-08-12 – 2015-08-13 (×2): 2 via ORAL
  Filled 2015-08-12 (×2): qty 2

## 2015-08-12 MED ORDER — TETANUS-DIPHTH-ACELL PERTUSSIS 5-2.5-18.5 LF-MCG/0.5 IM SUSP: 0.5000 mL | Freq: Once | INTRAMUSCULAR | Status: DC

## 2015-08-12 MED ORDER — DIPHENHYDRAMINE HCL 25 MG PO CAPS: 25.0000 mg | ORAL_CAPSULE | Freq: Four times a day (QID) | ORAL | Status: DC | PRN

## 2015-08-12 MED ORDER — LANOLIN HYDROUS EX OINT: TOPICAL_OINTMENT | CUTANEOUS | Status: DC | PRN

## 2015-08-12 MED ORDER — RHO D IMMUNE GLOBULIN 1500 UNIT/2ML IJ SOSY
300.0000 ug | PREFILLED_SYRINGE | Freq: Once | INTRAMUSCULAR | Status: AC
  Administered 2015-08-12: 300 ug via INTRAVENOUS
  Filled 2015-08-12: qty 2

## 2015-08-12 MED ORDER — LIDOCAINE-EPINEPHRINE (PF) 2 %-1:200000 IJ SOLN
INTRAMUSCULAR | Status: DC | PRN
  Administered 2015-08-12: 3 mL

## 2015-08-12 MED ORDER — DIBUCAINE 1 % RE OINT: 1.0000 "application " | TOPICAL_OINTMENT | RECTAL | Status: DC | PRN

## 2015-08-12 NOTE — Anesthesia Procedure Notes (Signed)
Epidural Patient location during procedure: OB  Staffing Anesthesiologist: Shaindel Sweeten Performed by: anesthesiologist   Preanesthetic Checklist Completed: patient identified, surgical consent, pre-op evaluation, timeout performed, IV checked, risks and benefits discussed and monitors and equipment checked  Epidural Patient position: sitting Prep: DuraPrep Patient monitoring: heart rate, cardiac monitor, continuous pulse ox and blood pressure Approach: midline Location: L4-L5 Injection technique: LOR saline  Needle:  Needle type: Tuohy  Needle gauge: 17 G Needle length: 9 cm Needle insertion depth: 6 cm Catheter type: closed end flexible Catheter size: 19 Gauge Catheter at skin depth: 12 cm Test dose: negative and 2% lidocaine with Epi 1:200 K  Assessment Events: blood not aspirated, injection not painful, no injection resistance, negative IV test and no paresthesia  Additional Notes Reason for block:procedure for pain

## 2015-08-12 NOTE — Lactation Note (Signed)
This note was copied from a baby's chart. Lactation Consultation Note Follow up visit at 19 hours of age.  RN requests visit, unable to get baby latched, mom has been spoon feeding small amounts.  Baby is fussy, attempted latch in football, laid back and cross cradle.  Baby does not maintain suck and mom is to uncomfortable to allow further attempts.  First latch mom ripped baby off right nipple.  LC instructed on proper technique to unlatch baby.  Lc advised use of NS mom agreed.  Baby will extend tongue past lower gumline to cup finger and suck well for a moment and then pulls tongue back and bites.  Baby is not rhythmic with sucking.  Encouraged suck training.  Mom is asking LC if baby is tongue tied.  Baby has bowl shaped tongue with cry and possible tongue is anchored posteriorly, but no visual frenulum noted.  Baby does not improve with latch using a #20 or #24 ns.  Advised mom to pump to increase stimulation to breasts.   Plan if for mom to offer STS feedings with early feeding cues.  Mom to call Rn for assist with application of NS.  Mom to supplement with EBM by spoon if baby is not doing well with latching and will need to explore more options when baby is over 24 hours. Baby appears over stimulated at this time.  Encouraged mom to soothe baby in other ways and LC reports to RN, Tia to set up DEBP and begin pumping.  LC impression mom tender breasts/nipples and increased with poor latch.  LC advised mom to not use OTC cream noted at bedside on nipples, but to hand express and apply to nipples.    Patient Name: Kimberly Delgado [id number] Date: 08/12/2015 Reason for consult: Follow-up assessment;Breast/nipple pain;Difficult latch   Maternal Data Has patient been taught Hand Expression?: Yes  Feeding Feeding Type: Breast Fed Length of feed:  (few sucks)  LATCH Score/Interventions Latch: Repeated attempts needed to sustain latch, nipple held in mouth throughout feeding, stimulation  needed to elicit sucking reflex. Intervention(s): Skin to skin;Teach feeding cues;Waking techniques  Audible Swallowing: None Intervention(s): Skin to skin;Hand expression;Alternate breast massage  Type of Nipple: Everted at rest and after stimulation  Comfort (Breast/Nipple): Engorged, cracked, bleeding, large blisters, severe discomfort  Problem noted: Severe discomfort Interventions (Severe discomfort): Double electric pum  Hold (Positioning): Full assist, staff holds infant at breast Intervention(s): Breastfeeding basics reviewed;Support Pillows;Position options;Skin to skin  LATCH Score: 3  Lactation Tools Discussed/Used Tools: Nipple Shields Nipple shield size: 20;24   Consult Status Consult Status: Follow-up Date: 08/13/15 Follow-up type: In-patient    Foy Vanduyne, Justine Null 08/12/2015, 10:26 PM

## 2015-08-12 NOTE — Lactation Note (Signed)
This note was copied from a baby's chart. Lactation Consultation Note  Initial visit made.  Breastfeeding consultation services and support information given and reviewed.  Baby is 67 hours old and has latched a few times and spoon fed once.  Baby has been sleepy.  Reviewed basics including feeding with any cue, waking techniques and breast massage.  Baby is currently sleeping skin to skin on FOB's chest.  Mom comfortable with hand expression and spoon feeding.  Encouraged to call for assist/concerns prn.  Patient Name: Kimberly Delgado [id number] Date: 08/12/2015 Reason for consult: Initial assessment   Maternal Data Has patient been taught Hand Expression?: Yes Does the patient have breastfeeding experience prior to this delivery?: No  Feeding Feeding Type: Breast Fed Length of feed:  (few sucks off and on)  LATCH Score/Interventions Latch: Repeated attempts needed to sustain latch, nipple held in mouth throughout feeding, stimulation needed to elicit sucking reflex. Intervention(s):  (able to hand express easily)  Audible Swallowing: None  Type of Nipple: Everted at rest and after stimulation  Comfort (Breast/Nipple): Filling, red/small blisters or bruises, mild/mod discomfort  Problem noted: Mild/Moderate discomfort  Hold (Positioning): Assistance needed to correctly position infant at breast and maintain latch.  LATCH Score: 5  Lactation Tools Discussed/Used     Consult Status Consult Status: Follow-up Date: 08/13/15 Follow-up type: In-patient    Ave Filter 08/12/2015, 5:21 PM

## 2015-08-12 NOTE — Anesthesia Preprocedure Evaluation (Signed)
Anesthesia Evaluation  Patient identified by MRN, date of birth, ID band Patient awake    Reviewed: Allergy & Precautions, Patient's Chart, lab work & pertinent test results  History of Anesthesia Complications Negative for: history of anesthetic complications  Airway Mallampati: II  TM Distance: >3 FB Neck ROM: Full    Dental  (+) Teeth Intact   Pulmonary neg shortness of breath, neg sleep apnea, neg COPD, neg recent URI,    breath sounds clear to auscultation       Cardiovascular negative cardio ROS   Rhythm:Regular     Neuro/Psych PSYCHIATRIC DISORDERS Anxiety Depression negative neurological ROS     GI/Hepatic negative GI ROS, Neg liver ROS,   Endo/Other  negative endocrine ROS  Renal/GU negative Renal ROS     Musculoskeletal   Abdominal   Peds  Hematology negative hematology ROS (+)   Anesthesia Other Findings   Reproductive/Obstetrics (+) Pregnancy                             Anesthesia Physical Anesthesia Plan  ASA: II  Anesthesia Plan: Epidural   Post-op Pain Management:    Induction:   Airway Management Planned:   Additional Equipment:   Intra-op Plan:   Post-operative Plan:   Informed Consent: I have reviewed the patients History and Physical, chart, labs and discussed the procedure including the risks, benefits and alternatives for the proposed anesthesia with the patient or authorized representative who has indicated his/her understanding and acceptance.     Plan Discussed with: Anesthesiologist  Anesthesia Plan Comments:         Anesthesia Quick Evaluation

## 2015-08-12 NOTE — Progress Notes (Signed)
Patient ID: RABIA GORMLEY, female   DOB: 13-Dec-1985, 30 y.o.   MRN: [id number] INTERVAL NOTE:  S:   Sitting in bed, min cramping, (+) voids, small bleed, denies HA/NV/dizziness  O:   VSS, AAO x 3, NAD  U-1  Scant lochia  A / P:   PPD #0  Stable post partum  Routine PP orders  Graceann Congress, MSN, CNM 08/12/2015, 10:02 AM

## 2015-08-12 NOTE — Anesthesia Postprocedure Evaluation (Signed)
Anesthesia Post Note  Patient: Kimberly Delgado  Procedure(s) Performed: * No procedures listed *  Patient location during evaluation: Mother Baby Anesthesia Type: Epidural Level of consciousness: awake and alert Pain management: satisfactory to patient Vital Signs Assessment: post-procedure vital signs reviewed and stable Respiratory status: respiratory function stable Cardiovascular status: stable Postop Assessment: no headache, no backache, epidural receding, patient able to bend at knees, no signs of nausea or vomiting and adequate PO intake Anesthetic complications: no Comments: Comfort level was assessed by AnesthesiaTeam and the patient was pleased with the care, interventions, and services provided by the Department of Anesthesia.    Last Vitals:  Filed Vitals:   08/12/15 0530 08/12/15 0700  BP: 116/66 108/62  Pulse: 72 67  Temp: 36.9 C 37.1 C  Resp: 18 18    Last Pain:  Filed Vitals:   08/12/15 0732  PainSc: 0-No pain                 Mandy Fitzwater

## 2015-08-12 NOTE — H&P (Signed)
Kimberly Delgado is a 30 y.o. female presenting at 39.3 wks, active labor  G2P0100, incompetent cervix. Cerclage removed at 37 wks.  ObHx- 1 preterm at 24 wks with neonatal demise. This preg- elective cerclage at 13 wks, 17-Prog inj from 16 wks. Short cx at 27 wks with in-patient admission at [place], [place] at 28 wks Then procardia off and on for UCs. Cerclage removed at 37 wks.  Healthy female. Marland Kitchen History OB History    Gravida Para Term Preterm AB TAB SAB Ectopic Multiple Living   2 1 0 1 0 0 0 0  0     Past Medical History  Diagnosis Date  . Anxiety   . UTI (urinary tract infection)     last one was in 2007  . Pyelonephritis, acute   . Seasonal allergies   . Preterm delivery (10/7) 03/03/2013  . Postpartum care following vaginal delivery (10/7) 03/03/2013  . SVD (spontaneous vaginal delivery)     x 1  . Asthma     exercised induced asthma - rarely uses inhaler  . Depression     hsitory - no meds currently   Past Surgical History  Procedure Laterality Date  . Wisdom tooth extraction    . Cervical cerclage N/A 02/17/2015    Procedure: CERCLAGE CERVICAL;  Surgeon: Servando Salina, MD;  Location: [facility];  Service: Gynecology;  Laterality: N/A;  EDD: 08/16/15   Family History: family history includes Diabetes in her mother and other. Social History:  reports that she has never smoked. She has never used smokeless tobacco. She reports that she drinks alcohol. She reports that she does not use illicit drugs.   Prenatal Transfer Tool  Maternal Diabetes: No Genetic Screening: Normal Maternal Ultrasounds/Referrals: Normal Fetal Ultrasounds or other Referrals:  None Maternal Substance Abuse:  No Significant Maternal Medications:  17-Prog inj 16-36 wks, Procardia as needed for contractions, Ranitidine Significant Maternal Lab Results:  Lab values include: Group B Strep negative Other Comments:  Prior 24 wk demise. Cerclage in this pregnancy and 17-prog ing  ROS   neg  Dilation: 8 Effacement (%): 100 Station: 0 Exam by:: dr Mischell Branford Blood pressure 117/83, pulse 72, temperature 97.8 F (36.6 C), temperature source Oral, resp. rate 18, height 5\' 6"  (1.676 m), weight 170 lb 6.4 oz (77.293 kg), last menstrual period 11/09/2014, unknown if currently breastfeeding. Exam Physical Exam  :  A&O x 3, no acute distress. Pleasant HEENT neg, no thyromegaly Lungs CTA bilat CV RRR, S1S2 normal Abdo soft, non tender, non acute Extr no edema/ tenderness Pelvic above FHT  130/ category I Toco every 2 min  Prenatal labs: ABO, Rh: O/Negative/-- (08/16 0000) Antibody: Negative (08/16 0000) Rubella: Immune (08/16 0000) RPR: Nonreactive (08/16 0000)  HBsAg: Negative (08/16 0000)  HIV: Non-reactive (08/16 0000)  GBS: Negative (02/15 0000)   Assessment/Plan: 30 yo G2P0100, 39.3 wks, active labor. Epidural desired.  Anticipate SVD Prior preterm loss.   Briceson Broadwater R 08/12/2015, 12:02 AM

## 2015-08-13 LAB — RH IG WORKUP (INCLUDES ABO/RH)
ABO/RH(D): O NEG
FETAL SCREEN: NEGATIVE
Gestational Age(Wks): 39.3
UNIT DIVISION: 0

## 2015-08-13 NOTE — Progress Notes (Signed)
Patient ID: Kimberly Delgado, female   DOB: Jun 06, 1985, 30 y.o.   MRN: [id number] PPD # 1 SVD  S:  Reports feeling well             Tolerating po/ No nausea or vomiting             Bleeding is light             Pain controlled with ibuprofen (OTC)             Up ad lib / ambulatory / voiding without difficulties    Newborn  Information for the patient's newborn:  [name], [name] [id number]  female  breast feeding - baby very sleepy   O:  A & O x 3, in no apparent distress              VS:  Filed Vitals:   08/12/15 1030 08/12/15 1744 08/12/15 1800 08/13/15 0500  BP: 116/75 96/62 96/62  108/74  Pulse: 72 75 75 94  Temp: 98.4 F (36.9 C) 98.2 F (36.8 C) 98.2 F (36.8 C) 98.1 F (36.7 C)  TempSrc: Oral Oral  Oral  Resp: 17 18 19 17   Height:      Weight:      SpO2:  99%      LABS:  Recent Labs  08/11/15 2345 08/12/15 0635  WBC 12.9* 20.2*  HGB 12.6 11.4*  HCT 37.1 33.7*  PLT 254 227    Blood type: --/--/O NEG (03/18 [id number]) / Infant Rh POS / Rhophylac done 3/18  Rubella: Immune (08/16 0000)   I&O: I/O last 3 completed shifts: In: -  Out: 575 [Urine:475; Blood:100]             Lungs: Clear and unlabored  Heart: regular rate and rhythm / no murmurs  Abdomen: soft, non-tender, non-distended             Fundus: firm, non-tender, U-1  Perineum: healing well, no edema, ecchymosis, or erythema  Lochia: minmal  Extremities: Trace edema, no calf pain or tenderness, No Homans    A/P: PPD # 1  30 y.o., [id number]   Principal Problem:    Postpartum care following vaginal delivery (3/18)  Active Problems:    Active labor at term    SVD (spontaneous vaginal delivery)   Doing well - stable status  Routine post partum orders  Work on latch and breastfeeding more with [place] discharge tomorrow    Laury Deep, Jerilynn Mages, MSN, CNM 08/13/2015, 9:45 AM

## 2015-08-13 NOTE — Lactation Note (Signed)
This note was copied from a baby's chart. Lactation Consultation Note Follow up visit at 16 hours of age.  Baby has had a recent feeding of 10 minutes per mom without NS and previous latched feeding was just after delivery.  Baby has been spoon fed a few mls in lifetime.  Baby had 6% weight loss in first 24 hours of life.  Baby has had 5voids and 5 stools in the past 24 hours. Peds MD visited this am and reported baby to be WNL and not concerned about intake at this time. Mom has been using pump or hand expressing, but not both, she reports discomfort.  Baby STS with some cueing and rooting.  [place] assisted with positioning, baby sucks a few times and stops.  Mom has support at bedside with visitor and FOB.  Mom is not concerned about how much baby is getting at this time.  LC will attempt to follow after weight check this evening.  Mom to call for assist as needed and asked to call RN for [facility] score with next feeding to assess for swallows.  Baby is not jittery at this time and relaxed with mom STS.       Patient Name: Kimberly Delgado [id number] Date: 08/13/2015 Reason for consult: Follow-up assessment;Difficult latch;Infant weight loss   Maternal Data Has patient been taught Hand Expression?: Yes Does the patient have breastfeeding experience prior to this delivery?: No  Feeding Feeding Type: Breast Fed Length of feed:  (few sucks)  LATCH Score/Interventions Latch: Repeated attempts needed to sustain latch, nipple held in mouth throughout feeding, stimulation needed to elicit sucking reflex. Intervention(s): Skin to skin;Teach feeding cues;Waking techniques Intervention(s): Breast compression;Breast massage;Assist with latch;Adjust position  Audible Swallowing: None  Type of Nipple: Everted at rest and after stimulation  Comfort (Breast/Nipple): Soft / non-tender     Hold (Positioning): Assistance needed to correctly position infant at breast and maintain  latch. Intervention(s): Breastfeeding basics reviewed;Support Pillows;Position options;Skin to skin  LATCH Score: 6  Lactation Tools Discussed/Used     Consult Status Consult Status: Follow-up Date: 08/14/15 Follow-up type: In-patient    Justice Britain 08/13/2015, 5:08 PM

## 2015-08-13 NOTE — Lactation Note (Signed)
This note was copied from a baby's chart. Lactation Consultation Note Follow up visit at 34 hours of age.  Baby is latched in football hold on right breast. Baby is holding nipple in mouth, but not sucking.  When stimulated to suck, baby lets go and is fussy.  Multiple relatch attempts with pain noted.  Right nipple is compressed and flattened on tip of nipple with this feeding.  Last feeding was recent and for about 30 minutes.  Mom reports frequent sucks with short pauses for the whole 30 minutes.  LC attempted position changes without improved latch.  Lc advised parents to console baby and re-attempt with feeding cues. Mom requested comfort gels and reports she knows how to use them.  Lc advised mom to schedule o/p appt. If she does not have improved comfort after 5 days of use.  Discussed cluster feeding.    Patient Name: Kimberly Delgado [id number] Date: 08/13/2015 Reason for consult: Follow-up assessment;Breast/nipple pain;Difficult latch;Infant weight loss   Maternal Data    Feeding Feeding Type: Breast Fed Length of feed:  (few sucks but does not maintain feeding well)  LATCH Score/Interventions Latch: Repeated attempts needed to sustain latch, nipple held in mouth throughout feeding, stimulation needed to elicit sucking reflex. Intervention(s): Skin to skin Intervention(s): Assist with latch;Adjust position;Breast compression  Audible Swallowing: None Intervention(s): Hand expression Intervention(s): Skin to skin;Hand expression  Type of Nipple: Everted at rest and after stimulation  Comfort (Breast/Nipple): Filling, red/small blisters or bruises, mild/mod discomfort  Problem noted: Mild/Moderate discomfort Interventions  (Cracked/bleeding/bruising/blister): Double electric pump;Expressed breast milk to nipple Interventions (Mild/moderate discomfort): Comfort gels  Hold (Positioning): Assistance needed to correctly position infant at breast and maintain  latch. Intervention(s): Breastfeeding basics reviewed;Support Pillows;Position options;Skin to skin  LATCH Score: 5  Lactation Tools Discussed/Used     Consult Status Consult Status: Follow-up Date: 08/14/15 Follow-up type: In-patient    Justice Britain 08/13/2015, 10:29 PM

## 2015-08-13 NOTE — Progress Notes (Signed)
CSW attempted to meet with parents to offer support and complete assessment due to history of Anxiety and Depression.  FOB came to the door when CSW knocked and stated that [name] is breast feeding, asking CSW to return at a later time.  CSW will attempt again at a later time.

## 2015-08-14 MED ORDER — HYDROCORTISONE ACE-PRAMOXINE 2.5-1 % RE CREA: 1.0000 "application " | TOPICAL_CREAM | Freq: Three times a day (TID) | RECTAL | Status: DC

## 2015-08-14 MED ORDER — IBUPROFEN 600 MG PO TABS: 600.0000 mg | ORAL_TABLET | Freq: Four times a day (QID) | ORAL | Status: DC

## 2015-08-14 MED FILL — HYDROCORT-PRAMOXINE 2.5-1%: 2.5-1 | 10 days supply | Qty: 30 | Fill #0

## 2015-08-14 MED FILL — IBUPROFEN 600 MG TABLET: 600 | 7 days supply | Qty: 30 | Fill #0

## 2015-08-14 NOTE — Discharge Summary (Signed)
Obstetric Discharge Summary Reason for Admission: onset of labor Prenatal Procedures: cerclage with removal at 37weeks Intrapartum Procedures: spontaneous vaginal delivery Postpartum Procedures: Rho(D) Ig Complications-Operative and Postpartum: none HEMOGLOBIN  Date Value Ref Range Status  08/12/2015 11.4* 12.0 - 15.0 g/dL Final   HCT  Date Value Ref Range Status  08/12/2015 33.7* 36.0 - 46.0 % Final    Physical Exam:  General: alert, cooperative and no distress Lochia: appropriate Uterine Fundus: firm Perineum intact - labial repair DVT Evaluation: No evidence of DVT seen on physical exam.  Discharge Diagnoses: Term Pregnancy-delivered  Discharge Information: Date: 08/14/2015 Activity: pelvic rest Diet: routine Medications: PNV and Ibuprofen Condition: stable Instructions: refer to practice specific booklet Discharge to: home Follow-up Information    Follow up with MODY,VAISHALI R, MD. Schedule an appointment as soon as possible for a visit in 6 weeks.   Specialty:  Obstetrics and Gynecology   Contact information:   [place] [address] [phone]       Newborn Data: Live born female  Birth Weight: 6 lb 13.9 oz (3115 g) APGAR: 9, 9  Home with mother.  Artelia Laroche 08/14/2015, 11:25 AM

## 2015-08-14 NOTE — Lactation Note (Signed)
This note was copied from a baby's chart. Lactation Consultation Note Mom called out for latching assistance. Baby is very fussy wanting to be on the breast constantly, but falls a sleep after a few sucks. Arms are floppy and stops sucking, if put in bed baby cries. Hand expressed colostrum, baby still slept. Mom and FOB took turns holding baby STS. Assisted in football hold aft mom held in [place] and cross cradle. Baby is having a lot of stools and voids. Mom has comfort gels. Discussed cluster feeding, supply and demand.  Patient Name: Kimberly Delgado [id number] Date: 08/14/2015 Reason for consult: Follow-up assessment;Difficult latch   Maternal Data    Feeding Feeding Type: Breast Fed Length of feed: 5 min  LATCH Score/Interventions Latch: Grasps breast easily, tongue down, lips flanged, rhythmical sucking. Intervention(s): Skin to skin;Waking techniques;Teach feeding cues Intervention(s): Adjust position  Audible Swallowing: Spontaneous and intermittent Intervention(s): Hand expression Intervention(s): Alternate breast massage  Type of Nipple: Everted at rest and after stimulation  Comfort (Breast/Nipple): Soft / non-tender  Interventions (Mild/moderate discomfort): Comfort gels;Hand massage;Hand expression  Hold (Positioning): Assistance needed to correctly position infant at breast and maintain latch. Intervention(s): Support Pillows;Position options;Skin to skin;Breastfeeding basics reviewed  LATCH Score: 9  Lactation Tools Discussed/Used Tools: Comfort gels   Consult Status Consult Status: Follow-up Date: 08/14/15 Follow-up type: In-patient    Kaleigh Spiegelman, Elta Gu[place] 08/14/2015, 4:18 AM

## 2015-08-14 NOTE — Progress Notes (Signed)
CSW attempted to meet with parents to complete assessment due to history of anxiety and depression. [name] was meeting with lactation upon CSW arrival.  CSW unable to complete assessment prior to discharge. Of note, [name] presents with a history of anxiety and depression of more than 3 years, and has a history of participating in therapy as needs arise.  No acute concerns from nursing staff during the admission or noted during her prenatal records.

## 2015-08-14 NOTE — Lactation Note (Signed)
This note was copied from a baby's chart. Lactation Consultation Note  Patient Name: Kimberly Delgado [id number] Date: 08/14/2015 Reason for consult: Follow-up assessment   Follow up with mom of 59 hour old infant. Infant with 4 BF for 10-40 minutes, 9 attempts, 5 EBM supplementations via spoon/syringe of 1-5 cc. 3 voids and 9 stools in last 24 hours. LATCH Scores 5-9 by bedside RN's. Infant weight 6 lb 5.2 oz with weight loss of 8% since birth.   Mom had infant latched to left breast in football hold. Infant comes on and off and sucks a few times and then stops. Mom has infant positioned well and is using awakening techniques to keep infant interested. There was not colostrum seen on either side when mom hand expressed. Mom has a DEBP at bedside and is hand expressing/pumping about every 3 hours post BF. She is supplementing infant all EBM. Mom with small firm breasts and everted nipples. Mom reports some tenderness to breast from pumping/hand expressing and from infant latching on and off. She has comfort gels to use. Infant is easily arouseable and had eyes open at times during feeding attempts. Enc mom to practice STS with infant between feeds.   Enc mom to BF 8-12 x in 24 hours at first feeding cues and to protect milk supply by pumping and hand expressing 8-12 x in 24 hours until infant decides to BF consistently. Give all EBM to infant. 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 SIL is a LC and NICU nurse in [place] and was at bedside with mom.   Reviewed all BF information in Taking Care of Baby and Me Booklet. Reviewed BF Basics, engorgement prevention/treatment/comfort pumping with mom. Reviewed nipple care. Reviewed I/O and enc family to maintain feeding log and take to Ped visit. Reviewed LC Brochure, mom aware of OP Services, BF Support Groups and [place] Phone #. Enc mom to call with questions/concerns prn.   Infant with f/u Ped appt tomorrow per  parents.      Maternal Data Has patient been taught Hand Expression?: Yes Does the patient have breastfeeding experience prior to this delivery?: No  Feeding Feeding Type: Breast Fed Length of feed: 2 min  LATCH Score/Interventions Latch: Repeated attempts needed to sustain latch, nipple held in mouth throughout feeding, stimulation needed to elicit sucking reflex. Intervention(s): Skin to skin;Teach feeding cues;Waking techniques Intervention(s): Adjust position;Assist with latch;Breast massage;Breast compression  Audible Swallowing: None Intervention(s): Hand expression Intervention(s): Alternate breast massage  Type of Nipple: Everted at rest and after stimulation  Comfort (Breast/Nipple): Filling, red/small blisters or bruises, mild/mod discomfort  Problem noted: Mild/Moderate discomfort Interventions  (Cracked/bleeding/bruising/blister): Double electric pump;Expressed breast milk to nipple Interventions (Mild/moderate discomfort): Comfort gels  Hold (Positioning): No assistance needed to correctly position infant at breast. Intervention(s): Breastfeeding basics reviewed;Support Pillows;Position options;Skin to skin  LATCH Score: 6  Lactation Tools Discussed/Used WIC Program: No Pump Review: Setup, frequency, and cleaning;Milk Storage   Consult Status Consult Status: Complete Follow-up type: Call as needed    Donn Pierini 08/14/2015, 9:44 AM

## 2015-08-14 NOTE — Progress Notes (Signed)
PPD 2 SVD  S:  Reports feeling well - some hemorrhoid swelling             Tolerating po/ No nausea or vomiting             Bleeding is light             Pain controlled with motrin only             Up ad lib / ambulatory / voiding QS  Newborn breast feeding   O:               VS: BP 110/74 mmHg  Pulse 70  Temp(Src) 98.1 F (36.7 C) (Oral)  Resp 18  Ht 5\' 6"  (1.676 m)  Wt 77.293 kg (170 lb 6.4 oz)  BMI 27.52 kg/m2  SpO2 99%  LMP 11/09/2014  Breastfeeding? Unknown   LABS:              Recent Labs  08/11/15 2345 08/12/15 0635  WBC 12.9* 20.2*  HGB 12.6 11.4*  PLT 254 227               Blood type: --/--/O NEG (03/18 [id number]) / newborn RH positive - rhogam ordered  Rubella: Immune (08/16 0000)                                Physical Exam:             Alert and oriented X3  Abdomen: soft, non-tender, non-distended              Fundus: firm, non-tender, Ueven  Lochia: light  Extremities: no edema, no calf pain or tenderness  A: PPD # 2   Doing well - stable status  P: Routine post partum orders  DC home  Artelia Laroche CNM, MSN, [facility] 08/14/2015, 11:09 AM

## 2015-08-23 ENCOUNTER — Telehealth (HOSPITAL_COMMUNITY): Payer: Self-pay | Admitting: Lactation Services

## 2015-08-23 NOTE — Telephone Encounter (Signed)
Mother called and reports baby is 10 days and she is still having pain with latching. She reports her milk is in and she has been feeding baby at the breast. Baby is gaining weight. She reports baby does not latch deeply to breast and nipple is pinched and blue when she comes off the breast. Mother had difficulty latching while in the hospital. She has done suck training exercises, finger fed and attempted a nipple shield. Mother states she wanted to wait on the shield as a last resort but is receptive to try to use now due to soreness.Revieded application of NS and discussed pumping every 3 hours, 8/24hours to maintain supply.  Mother plans to finger feed today and pump to give her nipples a break. Appointment scheduled for 08/25/2015 at 9 am. Will contact mother if any earlier appts.become available.

## 2015-08-25 ENCOUNTER — Ambulatory Visit (HOSPITAL_COMMUNITY)
Admission: RE | Admit: 2015-08-25 | Discharge: 2015-08-25 | Disposition: A | Discharge status: Home / Self Care | Payer: 59 | Source: Ambulatory Visit | Attending: Obstetrics and Gynecology | Admitting: Obstetrics and Gynecology

## 2015-08-25 ENCOUNTER — Ambulatory Visit (HOSPITAL_COMMUNITY): Payer: 59

## 2015-08-25 NOTE — Lactation Note (Signed)
Lactation Consultation Note  Patient Name: CONTINA RUESS [id number] Date: 08/25/2015    Baby's Name: Lilla Shook Date of Birth: 08/12/2017 Pediatrician: Oneita Kras        Gender: female Gestational Age: [redacted]w[redacted]d (At Birth) Birth Weight: 6 lb 13.9 oz (3115 g) Weight at Discharge: Weight: 6 lb 5.2 oz (2869 g)Date of Discharge: 08/14/2015 Filed Weights   08/12/15 0309 08/13/15 0014 08/13/15 2346  Weight: 109.9 oz 6 lb 7 oz (2920 g) 6 lb 5.2 oz (2869 g)   Last weight taken from location outside of [facility]: 6 lbs 4 oz 3/21/17Pediatrician Office  Location: Lactation Office Weight today: 6 lbs 11 oz   Tiara comes in today for OP lactation appointment due to sore nipples.  Baby is 63 days old today.  Baby has gained 7 oz in 10 days.  Mom is exclusively breastfeeding, with one finger feed at night by Dad.  She pumps and obtains 45-60 ml and baby takes this amount.  Mom states baby sleeps better following this feeding, going 5 hrs a couple times.  Athalene has some blistering noted on nipple tips, and bruising noted on inner edges of both nipples.  Markyla uses traditional cradle hold when feeding Liliane.   Assisted Crissi with using football hold, and cross cradle holds.  She had been struggling with these positions in the hospital, so she stopped using them.  Pillows for support given with explanation on importance.  Guided [place]'s hands to support her breast, and sandwich it.  Showed her how to support baby's head more, and control her latching only when she is widely opened.  Baby a bit squirmy initially, feeling the breast more deeply in her mouth, but soon became more nutritive and fed very well.  Lachana stated the latch was comfortable.  Encouraged breast compression during nutritive sucks.  Post feeding weight after 15 minutes yielded 46 ml.  On 2nd breast, baby less hungry and fed on and off, taking only 6 ml.  Total feeding 52  ml.   Nicolette given Comfort Gels with instruction on use.  Recommended EBM onto nipples to continue.    Plan- - Hold off on cradle hold until Liliane gained past birth weight and nipple soreness gone -Use cross cradle and football hold to better control the latch -Sandwich breast making sure hands are far back on breast -Use breast compressioni when baby suckling to increase milk transfer -let her go 4 hrs max at night for now, until baby gained past birth weight. - call [place] prn as needed -Support Group information given.     Jim, [place] 08/25/2015, 9:20 AM

## 2015-09-01 ENCOUNTER — Ambulatory Visit (HOSPITAL_COMMUNITY)
Admission: RE | Admit: 2015-09-01 | Discharge: 2015-09-01 | Disposition: A | Discharge status: Home / Self Care | Payer: 59 | Source: Ambulatory Visit | Attending: Obstetrics and Gynecology | Admitting: Obstetrics and Gynecology

## 2015-09-01 NOTE — Lactation Note (Signed)
Lactation Consult for Kimberly Delgado (mother) and Kimberly Delgado (DOB: [birth year])  Mother's reason for visit:  F/u from 3/31 Consult:  Follow-Up Lactation Consultant:  Larkin Ina  ________________________________________________________________________ BW: [id number] (6# 13.9oz) D/c wt: [id number] (6# 5.2oz) 08-25-15: 6# 11oz ([place] outpatient appt) 08-29-15: 6# 13 oz (home visit RN) Today's wt: 6# 14.5 ________________________________________________________________________  Mother's Name: Kimberly Delgado Type of delivery: vag  Breastfeeding Experience:  primip Maternal Medical Conditions:  Anxiety/depression Maternal Medications:  Not asked ________________________________________________________________________  Breastfeeding History (Post Discharge)  Frequency of breastfeeding: q2h during day; q4h at night Duration of feeding: 20-40 min* (see note below)  Pumping  Type of pump:  Medela pump in style Frequency: three times/week Volume: 2 oz/session  Infant Intake and Output Assessment  Voids:7-9 in 24 hrs.  Color:  Clear yellow Stools: 1-2    in 24 hrs.  Color:  Yellow  ________________________________________________________________________  Maternal Breast Assessment  Breast:  Soft to palpation, but Mom reports fullness at various times of day Delgado:  Erect   _______________________________________________________________________ Feeding Assessment/Evaluation  Initial feeding assessment:  Infant's oral assessment:  WNL  Attached assessment:  Deep  Lips flanged:  Yes.    Suck assessment:  Displays both   Tools:  Shells Instructed on use and cleaning of tool:  Yes.    Pre-feed weight: 3132g   Post-feed weight: 3162 g  Amount transferred: 30 ml in 9 min L breast  Pre-feed weight: 3162 g  Post-feed weight: 3176 g Amount transferred: 14 ml R breast  Pre-feed weight: 3176 g  Post-feed weight: 3182 g  Amount transferred:  6 ml L breast, 8 min    Total amount transferred: 50 ml  Kimberly Delgado is almost 55 weeks old and is only 0.5oz above BW.  Mom said she was advised to limit Kimberly Delgado's feedings to no more than 30 min/feeding. Instead, I taught Mom how to determine suck: swallow ratio & to remove infant from breast when suck: swallow ratio becomes 4+:1 (suck: swallow ratio of 3:1 or better is nutritive & indicates milk transfer).   Mom has also been offering the R breast 1st at all the feedings b/c that is the easiest breast to latch onto. However, during this consult Kimberly Delgado did not get as much from the R breast as she did the L breast (she transferred 36mL in 9 min [on & off] with the 1st latch on the L side).  Mom experienced soreness on the L Delgado throughout most of the first latch of this consult. Mom's positioning may have contributed to this: when supporting the breasts, her fingers on the underside were preventing the baby's lower cheek from coming into contact w/the breast. Also, she was dependent upon a firm breastfeeding pillow (My Breastfriend), that was not allowing Kimberly Delgado to be positioned properly against Mother's body. When we removed the breastfeeding pillow & changed the way Mom supported her breast, she felt much more comfortable. Mom also made aware that b/c of her breast anatomy & Kimberly Delgado's improvement in "self-latching," she may not need to support the breast during the feeding.   Mom's nipples atraumatic, but shells provided to allow protection of nipples from clothing fabric. Mom will likely introduce a bottle once Kimberly Delgado if 31 weeks old. She knows to use a slow-flow Delgado & paced bottle-feeding was discussed.   Feeding plan:  1. Be sure both of Kimberly Delgado's cheeks are touching breast when latched. 2. If supporting your breast, place fingers on underside more like an underwire. Consider using just  pillows (not the Breastfriend) for now.  3. Consider offering L breast 1st every other feeding. Look to  suck: swallow ratio to determine whether to end feeding. 4. Try to fit in 1 extra feeding per day.   Mom to return as desired.   Of note for future consultations: Mom had a 24-week IUFD w/her 1st pregnancy.

## 2015-10-02 DIAGNOSIS — Z1151 Encounter for screening for human papillomavirus (HPV): Secondary | ICD-10-CM | POA: Diagnosis not present

## 2015-10-02 DIAGNOSIS — Z130 Encounter for screening for diseases of the blood and blood-forming organs and certain disorders involving the immune mechanism: Secondary | ICD-10-CM | POA: Diagnosis not present

## 2016-02-05 ENCOUNTER — Telehealth: Payer: 59 | Admitting: Family

## 2016-02-05 DIAGNOSIS — J069 Acute upper respiratory infection, unspecified: Secondary | ICD-10-CM | POA: Diagnosis not present

## 2016-02-05 DIAGNOSIS — B9689 Other specified bacterial agents as the cause of diseases classified elsewhere: Secondary | ICD-10-CM

## 2016-02-05 MED ORDER — BENZONATATE 100 MG PO CAPS: 100.0000 mg | ORAL_CAPSULE | Freq: Three times a day (TID) | ORAL | 0 refills | Status: DC | PRN

## 2016-02-05 MED ORDER — AZITHROMYCIN 250 MG PO TABS: ORAL_TABLET | ORAL | 0 refills | Status: DC

## 2016-02-05 NOTE — Progress Notes (Signed)

## 2016-03-26 ENCOUNTER — Telehealth: Payer: 59 | Admitting: Family

## 2016-03-26 DIAGNOSIS — B9689 Other specified bacterial agents as the cause of diseases classified elsewhere: Secondary | ICD-10-CM

## 2016-03-26 DIAGNOSIS — R05 Cough: Secondary | ICD-10-CM | POA: Diagnosis not present

## 2016-03-26 DIAGNOSIS — J329 Chronic sinusitis, unspecified: Secondary | ICD-10-CM | POA: Diagnosis not present

## 2016-03-26 DIAGNOSIS — R059 Cough, unspecified: Secondary | ICD-10-CM

## 2016-03-26 MED ORDER — DOXYCYCLINE HYCLATE 100 MG PO TABS: 100.0000 mg | ORAL_TABLET | Freq: Two times a day (BID) | ORAL | 0 refills | Status: DC

## 2016-03-26 MED FILL — DOXYCYCLINE HYCLATE 100 MG: 100 | 10 days supply | Qty: 20 | Fill #0

## 2016-03-26 NOTE — Progress Notes (Signed)
We are sorry that you are not feeling well.  Here is how we plan to help!  Based on what you have shared with me it looks like you have upper respiratory tract inflammation that has resulted in a significant cough.  Inflammation and infection in the upper respiratory tract is commonly called bronchitis and has four common causes:  Allergies, Viral Infections, Acid Reflux and Bacterial Infections.  Allergies, viruses and acid reflux are treated by controlling symptoms or eliminating the cause. An example might be a cough caused by taking certain blood pressure medications. You stop the cough by changing the medication. Another example might be a cough caused by acid reflux. Controlling the reflux helps control the cough.  Based on your presentation I believe you most likely have A cough due to bacteria.  When patients have a fever and a productive cough with a change in color or increased sputum production, we are concerned about bacterial bronchitis.  If left untreated it can progress to pneumonia.  If your symptoms do not improve with your treatment plan it is important that you contact your provider.   I hve prescribed Doxycycline 100 mg twice a day for 7 days     In addition you may use A non-prescription cough medication called Mucinex DM: take 2 tablets every 12 hours.   USE OF BRONCHODILATOR ("RESCUE") INHALERS: There is a risk from using your bronchodilator too frequently.  The risk is that over-reliance on a medication which only relaxes the muscles surrounding the breathing tubes can reduce the effectiveness of medications prescribed to reduce swelling and congestion of the tubes themselves.  Although you feel brief relief from the bronchodilator inhaler, your asthma may actually be worsening with the tubes becoming more swollen and filled with mucus.  This can delay other crucial treatments, such as oral steroid medications. If you need to use a bronchodilator inhaler daily, several times per  day, you should discuss this with your provider.  There are probably better treatments that could be used to keep your asthma under control.     HOME CARE . Only take medications as instructed by your medical team. . Complete the entire course of an antibiotic. . Drink plenty of fluids and get plenty of rest. . Avoid close contacts especially the very young and the elderly . Cover your mouth if you cough or cough into your sleeve. . Always remember to wash your hands . A steam or ultrasonic humidifier can help congestion.   GET HELP RIGHT AWAY IF: . You develop worsening fever. . You become short of breath . You cough up blood. . Your symptoms persist after you have completed your treatment plan MAKE SURE YOU   Understand these instructions.  Will watch your condition.  Will get help right away if you are not doing well or get worse.  Your e-visit answers were reviewed by a board certified advanced clinical practitioner to complete your personal care plan.  Depending on the condition, your plan could have included both over the counter or prescription medications. If there is a problem please reply  once you have received a response from your provider. Your safety is important to us.  If you have drug allergies check your prescription carefully.    You can use MyChart to ask questions about today's visit, request a non-urgent call back, or ask for a work or school excuse for 24 hours related to this e-Visit. If it has been greater than 24 hours you will   will need to follow up with your provider, or enter a new e-Visit to address those concerns. You will get an e-mail in the next two days asking about your experience.  I hope that your e-visit has been valuable and will speed your recovery. Thank you for using e-visits.  

## 2016-05-13 ENCOUNTER — Telehealth: Payer: 59 | Admitting: Family

## 2016-05-13 DIAGNOSIS — B9689 Other specified bacterial agents as the cause of diseases classified elsewhere: Secondary | ICD-10-CM

## 2016-05-13 DIAGNOSIS — J329 Chronic sinusitis, unspecified: Secondary | ICD-10-CM | POA: Diagnosis not present

## 2016-05-13 MED ORDER — AMOXICILLIN-POT CLAVULANATE 875-125 MG PO TABS: 1.0000 | ORAL_TABLET | Freq: Two times a day (BID) | ORAL | 0 refills | Status: AC

## 2016-05-13 MED FILL — AMOX TR-K CLV 875-125 MG TA: 875-125 | 7 days supply | Qty: 14 | Fill #0

## 2016-05-13 NOTE — Progress Notes (Signed)

## 2016-06-13 ENCOUNTER — Telehealth: Payer: 59 | Admitting: Family

## 2016-06-13 DIAGNOSIS — N390 Urinary tract infection, site not specified: Secondary | ICD-10-CM | POA: Diagnosis not present

## 2016-06-13 MED ORDER — CEPHALEXIN 500 MG PO CAPS: 500.0000 mg | ORAL_CAPSULE | Freq: Two times a day (BID) | ORAL | 0 refills | Status: DC

## 2016-06-13 MED FILL — CEPHALEXIN 500 MG CAPSULE: 500 | 7 days supply | Qty: 14 | Fill #0

## 2016-06-13 NOTE — Progress Notes (Signed)

## 2016-06-21 ENCOUNTER — Telehealth: Payer: 59 | Admitting: Family

## 2016-06-21 DIAGNOSIS — B9689 Other specified bacterial agents as the cause of diseases classified elsewhere: Secondary | ICD-10-CM

## 2016-06-21 DIAGNOSIS — J028 Acute pharyngitis due to other specified organisms: Secondary | ICD-10-CM

## 2016-06-21 MED ORDER — BENZONATATE 100 MG PO CAPS: 100.0000 mg | ORAL_CAPSULE | Freq: Three times a day (TID) | ORAL | 0 refills | Status: DC | PRN

## 2016-06-21 MED ORDER — AZITHROMYCIN 250 MG PO TABS: ORAL_TABLET | ORAL | 0 refills | Status: DC

## 2016-06-21 NOTE — Progress Notes (Signed)

## 2016-07-26 ENCOUNTER — Telehealth: Payer: 59 | Admitting: Family

## 2016-07-26 DIAGNOSIS — H109 Unspecified conjunctivitis: Secondary | ICD-10-CM

## 2016-07-26 MED ORDER — POLYMYXIN B-TRIMETHOPRIM 10000-0.1 UNIT/ML-% OP SOLN: 1.0000 [drp] | OPHTHALMIC | 0 refills | Status: DC

## 2016-07-26 MED FILL — POLYMYXIN B/TMP EYE DROPS: 10000-0.1 | 15 days supply | Qty: 10 | Fill #0

## 2016-07-26 NOTE — Progress Notes (Signed)

## 2016-09-02 NOTE — Progress Notes (Signed)
New Outpatient Visit Date: 09/03/2016  Referring Provider: Lucianne Lei, MD [address] [place], [place] [address]  Chief Complaint: Chest pain  HPI:  Kimberly Delgado is a 31 y.o. female who is being seen today for the evaluation of chest pain. she has a history of exercise-induced asthma and preterm labor. For the last few months, she has experienced intermittent chest pains that she describes as a squeezing around the left side of her rib cage, often accompanied by the feeling that her heart is racing. The symptoms occur daily and can last minutes to hours. The pain is not exertional and has a maximum intensity of 2-3/10. The patient tried taking Tums once to see if this would relieve the pain but did not notice any improvement. She has not tried any other medications for the discomfort. She denies shortness of breath, lightheadedness, diaphoresis, and nausea. He has experienced some numbness in both hands when she awakens in the morning or is driving. She denies other focal neurologic changes.  Kimberly Delgado denies a history of cardiac disease or prior heart testing. She consumes 2-3 cups of caffeinated coffee per day. She notes that her caffeine intake increased around the time that her symptoms began. She is concerned because two friends close to her age recent had "heart attacks." Of note, she is still intermittently breastfeeding her daughter.  --------------------------------------------------------------------------------------------------  Cardiovascular History & Procedures: Cardiovascular Problems:  Atypical chest pain  Palpitations  Risk Factors:  None  Cath/PCI:  None  CV Surgery:  None  EP Procedures and Devices:  None  Non-Invasive Evaluation(s):  None  Recent CV Pertinent Labs: No results found for: CHOL, HDL, LDLCALC, LDLDIRECT, TRIG, CHOLHDL, INR, BNP, K, MG, BUN,  CREATININE  --------------------------------------------------------------------------------------------------  Past Medical History:  Diagnosis Date  . Anxiety   . Asthma    exercised induced asthma - rarely uses inhaler  . Depression    hsitory - no meds currently  . Postpartum care following vaginal delivery (10/7) 03/03/2013  . Postpartum care following vaginal delivery (3/18) 08/12/2015  . Preterm delivery (10/7) 03/03/2013  . Pyelonephritis, acute   . Seasonal allergies   . SVD (spontaneous vaginal delivery)    x 1  . UTI (urinary tract infection)    last one was in 2007    Past Surgical History:  Procedure Laterality Date  . CERVICAL CERCLAGE N/A 02/17/2015   Procedure: CERCLAGE CERVICAL;  Surgeon: Servando Salina, MD;  Location: [facility];  Service: Gynecology;  Laterality: N/A;  EDD: 08/16/15  . WISDOM TOOTH EXTRACTION     Medications: Prenatal vitamin OTC ibuprofen as needed for pain  Allergies: Latex  Social History   Social History  . Marital status: Married    Spouse name: N/A  . Number of children: N/A  . Years of education: N/A   Occupational History  . Not on file.   Social History Main Topics  . Smoking status: Never Smoker  . Smokeless tobacco: Never Used  . Alcohol use 1.2 oz/week    1 Glasses of wine, 1 Cans of beer per week  . Drug use: No  . Sexual activity: Yes    Birth control/ protection: None     Comment: approx [redacted] wks gestation    Other Topics Concern  . Not on file   Social History Narrative   ** Merged History Encounter **        Family History  Problem Relation Age of Onset  . Diabetes Other   .  Diabetes Mother   . Diabetes Maternal Grandfather   . Asthma Maternal Grandfather     Review of Systems: A 12-system review of systems was performed and was negative except as noted in the HPI.  --------------------------------------------------------------------------------------------------  Physical Exam: BP 116/78 (BP  Location: Right Arm, Patient Position: Sitting, Cuff Size: Normal)   Pulse 73   Ht 5\' 7"  (1.702 m)   Wt 145 lb 4 oz (65.9 kg)   BMI 22.75 kg/m   General:  Well-developed, well-nourished woman, seated comfortably in the exam room. HEENT: No conjunctival pallor or scleral icterus.  Moist mucous membranes.  OP clear. Neck: Supple without lymphadenopathy, thyromegaly, JVD, or HJR.  No carotid bruit. Lungs: Normal work of breathing.  Clear to auscultation bilaterally without wheezes or crackles. Heart: Regular rate and rhythm without murmurs, rubs, or gallops.  Non-displaced PMI. Abd: Bowel sounds present.  Soft, NT/ND without hepatosplenomegaly Ext: No lower extremity edema.  Radial, PT, and DP pulses are 2+ bilaterally Skin: warm and dry without rash Neuro: CNIII-XII intact.  Strength and fine-touch sensation intact in upper and lower extremities bilaterally. Psych: Normal mood and affect.  EKG:  Normal sinus rhythm with anterior T-wave inversion and non-specific T-wave changes in the inferior leads. No prior tracing for comparison.  Lab Results  Component Value Date   WBC 20.2 (H) 08/12/2015   HGB 11.4 (L) 08/12/2015   HCT 33.7 (L) 08/12/2015   MCV 84.0 08/12/2015   PLT 227 08/12/2015    No results found for: NA, K, CL, CO2, BUN, CREATININE, GLUCOSE, ALT  No results found for: CHOL, HDL, LDLCALC, LDLDIRECT, TRIG, CHOLHDL   --------------------------------------------------------------------------------------------------  ASSESSMENT AND PLAN: Chest pain and palpitations Pain is atypical, given its quality, duration, and non-exertional character. Most likely etiology is musculoskeletal. The patient does not have any significant coronary artery risk factors. Additionally, her young age makes obstructive CAD very unlikely. The fact that the pain has been occurring intermittently for several months also makes an acute process like spontaneous coronary artery dissection unlikely.  However, her EKG today is abnormal with T-wave changes in multiple leads, most pronounced in the anterior distribution. I have asked her to try ibuprofen to see if this improves her pain. We also discussed starting low-dose aspirin, though we have agreed to defer this as she is still breastfeeding. We will obtain an exercise stress echocardiogram for further evaluation. If this study is normal, we will consider placement of a Holter monitor to evaluate for an arrhythmogenic cause of her pain/palpitations. We will also check a CBC, CMP, TSH, and lipid panel today.  Follow-up: Return to clinic in 1 month.  Nelva Bush, MD 09/03/2016 10:13 PM

## 2016-09-03 ENCOUNTER — Ambulatory Visit (INDEPENDENT_AMBULATORY_CARE_PROVIDER_SITE_OTHER): Payer: 59 | Admitting: Internal Medicine

## 2016-09-03 ENCOUNTER — Encounter: Payer: Self-pay | Admitting: Internal Medicine

## 2016-09-03 VITALS — BP 116/78 | HR 73 | Ht 67.0 in | Wt 145.2 lb

## 2016-09-03 DIAGNOSIS — Z13220 Encounter for screening for lipoid disorders: Secondary | ICD-10-CM | POA: Diagnosis not present

## 2016-09-03 DIAGNOSIS — R002 Palpitations: Secondary | ICD-10-CM | POA: Diagnosis not present

## 2016-09-03 DIAGNOSIS — R0789 Other chest pain: Secondary | ICD-10-CM | POA: Diagnosis not present

## 2016-09-03 DIAGNOSIS — R9431 Abnormal electrocardiogram [ECG] [EKG]: Secondary | ICD-10-CM | POA: Diagnosis not present

## 2016-09-03 MED ORDER — ASPIRIN EC 81 MG PO TBEC: 81.0000 mg | DELAYED_RELEASE_TABLET | Freq: Every day | ORAL | 3 refills | Status: DC

## 2016-09-03 NOTE — Patient Instructions (Signed)
Medication Instructions:  Your physician has recommended you make the following change in your medication:  1- START Aspirin 81 mg by mouth once a day - once you have discussed with your child's pediatrician. 2- You may take Ibuprofen over the counter as needed for chest pain.   Labwork: Your physician recommends that you return for lab work in: TODAY. (CBC, CMP, TSH)   Testing/Procedures: Your physician has requested that you have a stress echocardiogram. For further information please visit [email or web address]. Please follow instruction sheet as given.    Follow-Up: Your physician recommends that you schedule a follow-up appointment in: [place].    Exercise Stress Echocardiogram An exercise stress echocardiogram is a test that checks how well your heart is working. For this test, you will walk on a treadmill to make your heart beat faster. This test uses sound waves (ultrasound) and a computer to make pictures (images) of your heart. These pictures will be taken before you exercise and after you exercise. What happens before the procedure?  Follow instructions from your doctor about what you cannot eat or drink before the test.  Do not drink or eat anything that has caffeine in it. Stop having caffeine for 24 hours before the test.  Ask your doctor about changing or stopping your normal medicines. This is important if you take diabetes medicines or blood thinners. Ask your doctor if you should take your medicines with water before the test.  If you use an inhaler, bring it to the test.  Do not use any products that have nicotine or tobacco in them, such as cigarettes and e-cigarettes. Stop using them for 4 hours before the test. If you need help quitting, ask your doctor.  Wear comfortable shoes and clothing. What happens during the procedure?  You will be hooked up to a TV screen. Your doctor will watch the screen to see how fast your heart beats during the  test.  Before you exercise, a computer will make a picture of your heart. To do this:  A gel will be put on your chest.  A wand will be moved over the gel.  Sound waves from the wand will go to the computer to make the picture.  Your will start walking on a treadmill. The treadmill will start at a slow speed. It will get faster a little bit at a time. When you walk faster, your heart will beat faster.  The treadmill will be stopped when your heart is working hard.  You will lie down right away so another picture of your heart can be taken.  The test will take 30-60 minutes. What happens after the procedure?  Your heart rate and blood pressure will be watched after the test.  If your doctor says that you can, you may:  Eat what you usually eat.  Do your normal activities.  Take medicines like normal. Summary  An exercise stress echocardiogram is a test that checks how well your heart is working.  Follow instructions about what you cannot eat or drink before the test. Ask your doctor if you should take your normal medicines before the test.  Stop having caffeine for 24 hours before the test. Do not use anything with nicotine or tobacco in it for 4 hours before the test.  A computer will take a picture of your heart before you walk on a treadmill. It will take another picture when you are done walking.  Your heart rate and blood pressure  will be watched after the test. This information is not intended to replace advice given to you by your health care provider. Make sure you discuss any questions you have with your health care provider. Document Released: 03/10/2009 Document Revised: 02/04/2016 Document Reviewed: 02/04/2016 Elsevier Interactive Patient Education  2017 [employer].

## 2016-09-04 ENCOUNTER — Other Ambulatory Visit: Payer: Self-pay | Admitting: *Deleted

## 2016-09-04 ENCOUNTER — Telehealth: Payer: Self-pay | Admitting: *Deleted

## 2016-09-04 DIAGNOSIS — Z13220 Encounter for screening for lipoid disorders: Secondary | ICD-10-CM

## 2016-09-04 LAB — CBC WITH DIFFERENTIAL/PLATELET
BASOS: 0 %
Basophils Absolute: 0 10*3/uL (ref 0.0–0.2)
EOS (ABSOLUTE): 0.1 10*3/uL (ref 0.0–0.4)
EOS: 1 %
HEMATOCRIT: 40.3 % (ref 34.0–46.6)
Hemoglobin: 13.3 g/dL (ref 11.1–15.9)
IMMATURE GRANS (ABS): 0 10*3/uL (ref 0.0–0.1)
Immature Granulocytes: 0 %
LYMPHS: 34 %
Lymphocytes Absolute: 3.1 10*3/uL (ref 0.7–3.1)
MCH: 27.1 pg (ref 26.6–33.0)
MCHC: 33 g/dL (ref 31.5–35.7)
MCV: 82 fL (ref 79–97)
MONOS ABS: 0.5 10*3/uL (ref 0.1–0.9)
Monocytes: 6 %
NEUTROS ABS: 5.3 10*3/uL (ref 1.4–7.0)
Neutrophils: 59 %
Platelets: 267 10*3/uL (ref 150–379)
RBC: 4.91 x10E6/uL (ref 3.77–5.28)
RDW: 13.8 % (ref 12.3–15.4)
WBC: 9 10*3/uL (ref 3.4–10.8)

## 2016-09-04 LAB — COMPREHENSIVE METABOLIC PANEL
ALBUMIN: 4.3 g/dL (ref 3.5–5.5)
ALK PHOS: 68 IU/L (ref 39–117)
ALT: 11 IU/L (ref 0–32)
AST: 15 IU/L (ref 0–40)
Albumin/Globulin Ratio: 1.8 (ref 1.2–2.2)
BUN/Creatinine Ratio: 13 (ref 9–23)
BUN: 11 mg/dL (ref 6–20)
Bilirubin Total: <0.2 mg/dL (ref 0.0–1.2)
CO2: 24 mmol/L (ref 18–29)
CREATININE: 0.85 mg/dL (ref 0.57–1.00)
Calcium: 9.1 mg/dL (ref 8.7–10.2)
Chloride: 97 mmol/L (ref 96–106)
GFR calc Af Amer: 105 mL/min/{1.73_m2} (ref >59)
GFR calc non Af Amer: 91 mL/min/{1.73_m2} (ref >59)
GLUCOSE: 84 mg/dL (ref 65–99)
Globulin, Total: 2.4 g/dL (ref 1.5–4.5)
Potassium: 4.3 mmol/L (ref 3.5–5.2)
Sodium: 140 mmol/L (ref 134–144)
Total Protein: 6.7 g/dL (ref 6.0–8.5)

## 2016-09-04 LAB — TSH: TSH: 2.37 u[IU]/mL (ref 0.450–4.500)

## 2016-09-04 NOTE — Telephone Encounter (Signed)
Spoke with representative at [facility]. Lipid panel and direct LDL added on for patient. [facility] placed order and will faxed over requisition once they pull blood and confirm if there is enough for the add-on labs.

## 2016-09-04 NOTE — Telephone Encounter (Signed)
-----   Message from Nelva Bush, MD sent at 09/03/2016 10:02 PM EDT ----- Regarding: Add on lipid panel Hi Anderson Malta,  Sorry to bother you. Could you see if we can add a lipid panel and direct LDL to the blood that Ms. Willetts had drawn today? Thanks.  Gerald Stabs

## 2016-09-05 LAB — SPECIMEN STATUS REPORT

## 2016-09-05 LAB — LIPID PANEL
CHOLESTEROL TOTAL: 188 mg/dL (ref 100–199)
Chol/HDL Ratio: 2.4 ratio (ref 0.0–4.4)
HDL: 80 mg/dL (ref >39)
LDL Calculated: 82 mg/dL (ref 0–99)
Triglycerides: 128 mg/dL (ref 0–149)
VLDL CHOLESTEROL CAL: 26 mg/dL (ref 5–40)

## 2016-09-05 LAB — LDL CHOLESTEROL, DIRECT: LDL Direct: 94 mg/dL (ref 0–99)

## 2016-09-27 ENCOUNTER — Ambulatory Visit (INDEPENDENT_AMBULATORY_CARE_PROVIDER_SITE_OTHER): Payer: 59

## 2016-09-27 ENCOUNTER — Other Ambulatory Visit: Payer: Self-pay

## 2016-09-27 DIAGNOSIS — R0789 Other chest pain: Secondary | ICD-10-CM | POA: Diagnosis not present

## 2016-09-27 DIAGNOSIS — R9431 Abnormal electrocardiogram [ECG] [EKG]: Secondary | ICD-10-CM | POA: Diagnosis not present

## 2016-09-27 LAB — ECHOCARDIOGRAM STRESS TEST
CSEPEDS: 10 s
CSEPPHR: 190 {beats}/min
Estimated workload: 13.6 METS
Exercise duration (min): 12 min
MPHR: 188 {beats}/min
Percent HR: 101 %
Rest HR: 76 {beats}/min

## 2016-09-30 ENCOUNTER — Telehealth: Payer: 59 | Admitting: Family

## 2016-09-30 DIAGNOSIS — R399 Unspecified symptoms and signs involving the genitourinary system: Secondary | ICD-10-CM | POA: Diagnosis not present

## 2016-09-30 MED ORDER — NITROFURANTOIN MONOHYD MACRO 100 MG PO CAPS: 100.0000 mg | ORAL_CAPSULE | Freq: Two times a day (BID) | ORAL | 0 refills | Status: DC

## 2016-09-30 NOTE — Progress Notes (Signed)

## 2016-10-01 ENCOUNTER — Ambulatory Visit: Payer: Self-pay | Admitting: Internal Medicine

## 2016-10-16 ENCOUNTER — Encounter: Payer: Self-pay | Admitting: Internal Medicine

## 2016-10-16 ENCOUNTER — Ambulatory Visit (INDEPENDENT_AMBULATORY_CARE_PROVIDER_SITE_OTHER): Payer: 59 | Admitting: Internal Medicine

## 2016-10-16 VITALS — BP 108/78 | HR 71 | Ht 67.0 in | Wt 142.8 lb

## 2016-10-16 DIAGNOSIS — R0789 Other chest pain: Secondary | ICD-10-CM | POA: Diagnosis not present

## 2016-10-16 NOTE — Progress Notes (Signed)
Follow-up Outpatient Visit Date: 10/16/2016  Primary Care Provider: Lucianne Lei, MD [address] [place] [address]  Chief Complaint: Chest pain  HPI:  Kimberly Delgado is a 31 y.o. year-old female with history of exercise-induced asthma and preterm labor, who presents for follow-up of chest pain. I last saw her on 09/03/16, at which time she described occasional squeezing sensation around the left side of her rib cage, often accompanied by palpitations. We agreed to perform an exercise stress echocardiogram, which was normal. The patient has also tried ibuprofen, though it is hard to tell if this improves her pain as the episodes typically abate before she has a chance to take ibuprofen.  Since our last visit, Kimberly Delgado has only had 2 additional episodes of left-sided chest pressure. Both lasted about 10-15 minutes and occurred while at rest (once while driving and once while in bed). She did not have any other associated symptoms. She has been doing yoga and feels that this may be helping reduce the frequency and intensity of the episodes. She denies shortness of breath, palpitations, lightheadedness, orthopnea, PND, and edema. She did not begin taking aspirin after her last visit, as she still breast-feeds.  --------------------------------------------------------------------------------------------------  Cardiovascular History & Procedures: Cardiovascular Problems:  Atypical chest pain  Risk Factors:  None  Cath/PCI:  None  CV Surgery:  None  EP Procedures and Devices:  None  Non-Invasive Evaluation(s):  Exercise stress echocardiogram (09/27/16): Low risk study with good exercise capacity. Normal baseline LV function with hyperdynamic response. No regional wall motion abnormalities. Nonspecific ST and T changes noted on baseline tracing.  Recent CV Pertinent Labs: Lab Results  Component Value Date   CHOL 188 09/03/2016   HDL 80 09/03/2016   LDLCALC 82 09/03/2016   LDLDIRECT 94 09/03/2016   TRIG 128 09/03/2016   CHOLHDL 2.4 09/03/2016   K 4.3 09/03/2016   BUN 11 09/03/2016   CREATININE 0.85 09/03/2016    Past medical and surgical history were reviewed and updated in EPIC.  Outpatient Encounter Prescriptions as of 10/16/2016  Medication Sig  . ibuprofen (ADVIL,MOTRIN) 600 MG tablet Take 1 tablet (600 mg total) by mouth every 6 (six) hours. (Patient taking differently: Take 600 mg by mouth every 6 (six) hours as needed. )  . Prenatal Vit-Fe Fumarate-FA (PRENATAL MULTIVITAMIN) TABS tablet Take 1 tablet by mouth daily at 12 noon.   . [DISCONTINUED] aspirin EC 81 MG tablet Take 1 tablet (81 mg total) by mouth daily. (Patient not taking: Reported on 10/16/2016)  . [DISCONTINUED] nitrofurantoin, macrocrystal-monohydrate, (MACROBID) 100 MG capsule Take 1 capsule (100 mg total) by mouth 2 (two) times daily. 1 po BId (Patient not taking: Reported on 10/16/2016)   No facility-administered encounter medications on file as of 10/16/2016.     Allergies: Latex  Social History   Social History  . Marital status: Married    Spouse name: N/A  . Number of children: N/A  . Years of education: N/A   Occupational History  . Not on file.   Social History Main Topics  . Smoking status: Never Smoker  . Smokeless tobacco: Never Used  . Alcohol use 1.2 oz/week    1 Glasses of wine, 1 Cans of beer per week  . Drug use: No  . Sexual activity: Yes    Birth control/ protection: None     Comment: approx [redacted] wks gestation    Other Topics Concern  . Not on file   Social History Narrative   **  Merged History Encounter **        Family History  Problem Relation Age of Onset  . Diabetes Other   . Diabetes Mother   . Diabetes Maternal Grandfather   . Asthma Maternal Grandfather     Review of Systems: A 12-system review of systems was performed and was negative except as noted in the  HPI.  --------------------------------------------------------------------------------------------------  Physical Exam: BP 108/78 (BP Location: Left Arm, Patient Position: Sitting, Cuff Size: Normal)   Pulse 71   Ht 5\' 7"  (1.702 m)   Wt 142 lb 12 oz (64.8 kg)   BMI 22.36 kg/m   General:  Well-developed, well-nourished woman, seated comfortably in the exam room. HEENT: No conjunctival pallor or scleral icterus.  Moist mucous membranes.  OP clear. Neck: Supple without lymphadenopathy, thyromegaly, JVD, or HJR.  No carotid bruit. Lungs: Normal work of breathing.  Clear to auscultation bilaterally without wheezes or crackles. Heart: Regular rate and rhythm without murmurs, rubs, or gallops.  Non-displaced PMI. Abd: Bowel sounds present.  Soft, NT/ND without hepatosplenomegaly Ext: No lower extremity edema.  Radial, PT, and DP pulses are 2+ bilaterally. Skin: warm and dry without rash  Lab Results  Component Value Date   WBC 9.0 09/03/2016   HGB 11.4 (L) 08/12/2015   HCT 40.3 09/03/2016   MCV 82 09/03/2016   PLT 267 09/03/2016    Lab Results  Component Value Date   NA 140 09/03/2016   K 4.3 09/03/2016   CL 97 09/03/2016   CO2 24 09/03/2016   BUN 11 09/03/2016   CREATININE 0.85 09/03/2016   GLUCOSE 84 09/03/2016   ALT 11 09/03/2016    Lab Results  Component Value Date   CHOL 188 09/03/2016   HDL 80 09/03/2016   LDLCALC 82 09/03/2016   LDLDIRECT 94 09/03/2016   TRIG 128 09/03/2016   CHOLHDL 2.4 09/03/2016    --------------------------------------------------------------------------------------------------  ASSESSMENT AND PLAN: Atypical chest pain Symptoms have improved with conservative measures since our last visit. Pain remains atypical, occurring at rest. Exercise stress echocardiogram after our last visit is reassuring without any ischemic findings. We discussed the possibility for coronary vasospasm, though I have a low suspicion for this. I favor a  musculoskeletal etiology for her pain. We will defer further workup and interventions at this time. She should contact us if the pain worsens.  Follow-up: Return to clinic as needed.  Kimberly Bush, MD 10/16/2016 3:29 PM

## 2016-10-16 NOTE — Patient Instructions (Signed)
Medication Instructions:  Your physician recommends that you continue on your current medications as directed. Please refer to the Current Medication list given to you today.   Labwork: none  Testing/Procedures: none  Follow-Up: Your physician recommends that you schedule a follow-up appointment ON AN AS NEEDED BASIS.   

## 2016-11-25 ENCOUNTER — Ambulatory Visit: Payer: Self-pay | Admitting: Family

## 2016-11-25 VITALS — BP 114/80 | HR 96 | Temp 98.4°F

## 2016-11-25 DIAGNOSIS — J069 Acute upper respiratory infection, unspecified: Secondary | ICD-10-CM

## 2016-11-25 NOTE — Progress Notes (Signed)
3-4 day history of nasal congestion, cough, sore throat and hoarseness. Denies fever ,chest pain or GI symptoms She is taking over-the-counter medications. She is nursing her 104-month-old who has been treated for ear infections so she is here for an evaluation. Objective vital signs stable ENT nasal turbinates boggy mildly erythematous pharynx unremarkable neck supple without adenopathy heart RSR lungs are clear  A/upper respiratory infection  P/:   viral course discussed and supportive measures encouraged.

## 2017-04-03 ENCOUNTER — Telehealth: Payer: 59 | Admitting: Family

## 2017-04-03 DIAGNOSIS — B9689 Other specified bacterial agents as the cause of diseases classified elsewhere: Secondary | ICD-10-CM | POA: Diagnosis not present

## 2017-04-03 DIAGNOSIS — Z3169 Encounter for other general counseling and advice on procreation: Secondary | ICD-10-CM | POA: Diagnosis not present

## 2017-04-03 DIAGNOSIS — J329 Chronic sinusitis, unspecified: Secondary | ICD-10-CM

## 2017-04-03 MED ORDER — PREDNISONE 5 MG PO TABS: 5.0000 mg | ORAL_TABLET | ORAL | 0 refills | Status: DC

## 2017-04-03 MED ORDER — AZITHROMYCIN 250 MG PO TABS: ORAL_TABLET | ORAL | 0 refills | Status: DC

## 2017-04-03 NOTE — Progress Notes (Signed)
Thank you for the details you included in the comment boxes. Those details are very helpful in determining the best course of treatment for you and help us to provide the best care.  We are sorry that you are not feeling well.  Here is how we plan to help!  Based on your presentation I believe you most likely have A cough due to bacteria.  When patients have a fever and a productive cough with a change in color or increased sputum production, we are concerned about bacterial bronchitis.  If left untreated it can progress to pneumonia.  If your symptoms do not improve with your treatment plan it is important that you contact your provider.   I have prescribed Azithromyin 250 mg: two tablets now and then one tablet daily for 4 additonal days    In addition you may use A non-prescription cough medication called Mucinex DM: take 2 tablets every 12 hours.  Sterapred 5 mg dosepak  From your responses in the eVisit questionnaire you describe inflammation in the upper respiratory tract which is causing a significant cough.  This is commonly called Bronchitis and has four common causes:    Allergies  Viral Infections  Acid Reflux  Bacterial Infection Allergies, viruses and acid reflux are treated by controlling symptoms or eliminating the cause. An example might be a cough caused by taking certain blood pressure medications. You stop the cough by changing the medication. Another example might be a cough caused by acid reflux. Controlling the reflux helps control the cough.  USE OF BRONCHODILATOR ("RESCUE") INHALERS: There is a risk from using your bronchodilator too frequently.  The risk is that over-reliance on a medication which only relaxes the muscles surrounding the breathing tubes can reduce the effectiveness of medications prescribed to reduce swelling and congestion of the tubes themselves.  Although you feel brief relief from the bronchodilator inhaler, your asthma may actually be worsening  with the tubes becoming more swollen and filled with mucus.  This can delay other crucial treatments, such as oral steroid medications. If you need to use a bronchodilator inhaler daily, several times per day, you should discuss this with your provider.  There are probably better treatments that could be used to keep your asthma under control.     HOME CARE . Only take medications as instructed by your medical team. . Complete the entire course of an antibiotic. . Drink plenty of fluids and get plenty of rest. . Avoid close contacts especially the very young and the elderly . Cover your mouth if you cough or cough into your sleeve. . Always remember to wash your hands . A steam or ultrasonic humidifier can help congestion.   GET HELP RIGHT AWAY IF: . You develop worsening fever. . You become short of breath . You cough up blood. . Your symptoms persist after you have completed your treatment plan MAKE SURE YOU   Understand these instructions.  Will watch your condition.  Will get help right away if you are not doing well or get worse.  Your e-visit answers were reviewed by a board certified advanced clinical practitioner to complete your personal care plan.  Depending on the condition, your plan could have included both over the counter or prescription medications. If there is a problem please reply  once you have received a response from your provider. Your safety is important to us.  If you have drug allergies check your prescription carefully.    You can use MyChart   to ask questions about today's visit, request a non-urgent call back, or ask for a work or school excuse for 24 hours related to this e-Visit. If it has been greater than 24 hours you will need to follow up with your provider, or enter a new e-Visit to address those concerns. You will get an e-mail in the next two days asking about your experience.  I hope that your e-visit has been valuable and will speed your recovery.  Thank you for using e-visits.   

## 2017-04-28 DIAGNOSIS — N970 Female infertility associated with anovulation: Secondary | ICD-10-CM | POA: Diagnosis not present

## 2017-05-09 MED FILL — CLOMIPHENE CITRATE 50 MG TA: 50 | 5 days supply | Qty: 5 | Fill #0

## 2017-05-29 DIAGNOSIS — N970 Female infertility associated with anovulation: Secondary | ICD-10-CM | POA: Diagnosis not present

## 2017-06-04 DIAGNOSIS — Z01419 Encounter for gynecological examination (general) (routine) without abnormal findings: Secondary | ICD-10-CM | POA: Diagnosis not present

## 2017-06-04 DIAGNOSIS — Z1151 Encounter for screening for human papillomavirus (HPV): Secondary | ICD-10-CM | POA: Diagnosis not present

## 2017-06-04 DIAGNOSIS — Z6823 Body mass index (BMI) 23.0-23.9, adult: Secondary | ICD-10-CM | POA: Diagnosis not present

## 2017-06-09 MED FILL — CLOMIPHENE CITRATE 50 MG TA: 50 | 5 days supply | Qty: 5 | Fill #0

## 2017-06-20 ENCOUNTER — Telehealth: Payer: 59 | Admitting: Family

## 2017-06-20 DIAGNOSIS — J028 Acute pharyngitis due to other specified organisms: Secondary | ICD-10-CM

## 2017-06-20 DIAGNOSIS — B9689 Other specified bacterial agents as the cause of diseases classified elsewhere: Secondary | ICD-10-CM | POA: Diagnosis not present

## 2017-06-20 MED ORDER — BENZONATATE 100 MG PO CAPS: 100.0000 mg | ORAL_CAPSULE | Freq: Three times a day (TID) | ORAL | 0 refills | Status: DC | PRN

## 2017-06-20 MED ORDER — PREDNISONE 5 MG PO TABS: 5.0000 mg | ORAL_TABLET | ORAL | 0 refills | Status: DC

## 2017-06-20 MED ORDER — AZITHROMYCIN 250 MG PO TABS: ORAL_TABLET | ORAL | 0 refills | Status: DC

## 2017-06-20 NOTE — Progress Notes (Signed)

## 2017-06-30 DIAGNOSIS — N970 Female infertility associated with anovulation: Secondary | ICD-10-CM | POA: Diagnosis not present

## 2017-08-08 MED FILL — CLOMIPHENE CITRATE 50 MG TA: 50 | 5 days supply | Qty: 5 | Fill #0

## 2017-08-21 ENCOUNTER — Telehealth: Payer: 59 | Admitting: Physician Assistant

## 2017-08-21 DIAGNOSIS — Z3490 Encounter for supervision of normal pregnancy, unspecified, unspecified trimester: Secondary | ICD-10-CM

## 2017-08-21 DIAGNOSIS — Z20818 Contact with and (suspected) exposure to other bacterial communicable diseases: Secondary | ICD-10-CM

## 2017-08-21 NOTE — Progress Notes (Signed)
You have noted that you think you may be pregnant on your questionnaire. For the safety of you and your child, I recommend a face to face office visit with a health care provider. The main reason is that certain treatments are safe during pregnancy and others are not. We need to verify pregnancy status as it affects what treatments can or cannot be given safely.   Many mothers need to take medicines during their pregnancy and while nursing.  Almost all medicines pass into the breast milk in small quantities.  Most are generally considered safe for a mother to take but some medicines must be avoided.  After reviewing your E-Visit request, I recommend that you consult your OB/GYN or pediatrician for medical advice in relation to your condition and prescription medications while pregnant or breastfeeding.   If you are having a true medical emergency please call 911.  If you need an urgent face to face visit,  has four urgent care centers for your convenience.  If you need care fast and have a high deductible or no insurance consider:   [email or web address] to reserve your spot online an avoid wait times  [facility] [address] [place], [place] [address] 8 am to 8 pm Monday-Friday 10 am to 4 pm Saturday-Sunday *Across the street from [employer]  [place], [address] 8 am to 5 pm Monday-Friday * In the [facility] on the [facility]   The following sites will take your  insurance:  . [facility] [place] a Provider at this Location  [address] [place], [place] [address] . 10 am to 8 pm Monday-Friday . 12 pm to 8 pm Saturday-Sunday   . [facility] at [place] a Provider at this Location  [address], [place], [place] [address] . 8 am to 8 pm  Monday-Friday . 9 am to 6 pm Saturday . 11 am to 6 pm Sunday   . [facility] at [place] Get Driving Directions  [address] [place], [place] [address] . 8 am to 8 pm Monday-Friday . 8 am to 4 pm Saturday-Sunday   Your e-visit answers were reviewed by a board certified advanced clinical practitioner to complete your personal care plan.  Thank you for using e-Visits.

## 2017-08-28 DIAGNOSIS — N970 Female infertility associated with anovulation: Secondary | ICD-10-CM | POA: Diagnosis not present

## 2017-09-15 DIAGNOSIS — Z3A01 Less than 8 weeks gestation of pregnancy: Secondary | ICD-10-CM | POA: Diagnosis not present

## 2017-09-15 DIAGNOSIS — O0901 Supervision of pregnancy with history of infertility, first trimester: Secondary | ICD-10-CM | POA: Diagnosis not present

## 2017-09-15 DIAGNOSIS — Z3201 Encounter for pregnancy test, result positive: Secondary | ICD-10-CM | POA: Diagnosis not present

## 2017-09-17 DIAGNOSIS — Z3A01 Less than 8 weeks gestation of pregnancy: Secondary | ICD-10-CM | POA: Diagnosis not present

## 2017-09-17 DIAGNOSIS — O0901 Supervision of pregnancy with history of infertility, first trimester: Secondary | ICD-10-CM | POA: Diagnosis not present

## 2017-10-22 DIAGNOSIS — Z3A01 Less than 8 weeks gestation of pregnancy: Secondary | ICD-10-CM | POA: Diagnosis not present

## 2017-10-22 DIAGNOSIS — O209 Hemorrhage in early pregnancy, unspecified: Secondary | ICD-10-CM | POA: Diagnosis not present

## 2017-11-03 DIAGNOSIS — Z3A01 Less than 8 weeks gestation of pregnancy: Secondary | ICD-10-CM | POA: Diagnosis not present

## 2017-11-03 DIAGNOSIS — O039 Complete or unspecified spontaneous abortion without complication: Secondary | ICD-10-CM | POA: Diagnosis not present

## 2017-11-11 MED FILL — CLOMIPHENE CITRATE 50 MG TA: 50 | 5 days supply | Qty: 5 | Fill #0

## 2017-12-02 DIAGNOSIS — N970 Female infertility associated with anovulation: Secondary | ICD-10-CM | POA: Diagnosis not present

## 2017-12-11 MED FILL — CLOMIPHENE CITRATE 50 MG TA: 50 | 5 days supply | Qty: 5 | Fill #0

## 2018-01-19 MED FILL — CLOMIPHENE CITRATE 50 MG TA: 50 | 5 days supply | Qty: 5 | Fill #0

## 2018-02-24 MED FILL — CLOMIPHENE CITRATE 50 MG TA: 50 | 5 days supply | Qty: 5 | Fill #0

## 2018-02-25 MED FILL — busPIRone HCL 5 MG TABS: 5 | 30 days supply | Qty: 60 | Fill #0

## 2018-03-26 MED FILL — CLOMIPHENE CITRATE 50 MG TA: 50 | 5 days supply | Qty: 5 | Fill #0

## 2018-04-06 MED FILL — busPIRone HCL 5 MG TABS: 5 | 30 days supply | Qty: 60 | Fill #1

## 2018-04-13 MED FILL — AMOXICILLIN 875 MG TABS: 875 | 10 days supply | Qty: 20 | Fill #0

## 2018-04-28 MED FILL — OVIDREL 250 MCG/0.5 ML SYRG: 250 | 30 days supply | Qty: 1 | Fill #0

## 2018-04-28 MED FILL — CLOMIPHENE CITRATE 50 MG TA: 50 | 5 days supply | Qty: 5 | Fill #0

## 2018-05-07 DIAGNOSIS — N970 Female infertility associated with anovulation: Secondary | ICD-10-CM | POA: Diagnosis not present

## 2018-05-13 MED FILL — busPIRone HCL 5 MG TABS: 5 | 30 days supply | Qty: 60 | Fill #2

## 2018-06-05 DIAGNOSIS — E282 Polycystic ovarian syndrome: Secondary | ICD-10-CM | POA: Diagnosis not present

## 2018-06-05 DIAGNOSIS — Z319 Encounter for procreative management, unspecified: Secondary | ICD-10-CM | POA: Diagnosis not present

## 2018-06-05 MED FILL — OVIDREL 250 MCG/0.5 ML SYRG: 250 | 14 days supply | Qty: 1 | Fill #0

## 2018-06-05 MED FILL — LETROZOLE 2.5 MG TABLET: 2.5 | 5 days supply | Qty: 15 | Fill #0

## 2018-06-15 DIAGNOSIS — E282 Polycystic ovarian syndrome: Secondary | ICD-10-CM | POA: Diagnosis not present

## 2018-06-15 DIAGNOSIS — Z3141 Encounter for fertility testing: Secondary | ICD-10-CM | POA: Diagnosis not present

## 2018-06-15 DIAGNOSIS — Z319 Encounter for procreative management, unspecified: Secondary | ICD-10-CM | POA: Diagnosis not present

## 2018-06-15 MED FILL — metFORMIN HCL 1000 MG TABS: 1000 | 30 days supply | Qty: 60 | Fill #0

## 2018-06-24 DIAGNOSIS — E282 Polycystic ovarian syndrome: Secondary | ICD-10-CM | POA: Diagnosis not present

## 2018-06-24 DIAGNOSIS — Z8759 Personal history of other complications of pregnancy, childbirth and the puerperium: Secondary | ICD-10-CM | POA: Diagnosis not present

## 2018-06-24 DIAGNOSIS — Z319 Encounter for procreative management, unspecified: Secondary | ICD-10-CM | POA: Diagnosis not present

## 2018-07-02 DIAGNOSIS — Z6822 Body mass index (BMI) 22.0-22.9, adult: Secondary | ICD-10-CM | POA: Diagnosis not present

## 2018-07-02 DIAGNOSIS — Z01419 Encounter for gynecological examination (general) (routine) without abnormal findings: Secondary | ICD-10-CM | POA: Diagnosis not present

## 2018-07-03 MED FILL — busPIRone HCL 5 MG TABS: 5 | 30 days supply | Qty: 60 | Fill #3

## 2018-07-06 MED FILL — PROMETHAZINE 12.5 MG TABLET: 12.5 | 2 days supply | Qty: 8 | Fill #0

## 2018-07-06 MED FILL — traMADol HCL 50 MG TABS: 50 | 2 days supply | Qty: 6 | Fill #0

## 2018-07-08 DIAGNOSIS — N840 Polyp of corpus uteri: Secondary | ICD-10-CM | POA: Diagnosis not present

## 2018-07-22 MED FILL — metFORMIN HCL 1000 MG TABS: 1000 | 30 days supply | Qty: 60 | Fill #1

## 2018-07-24 DIAGNOSIS — Z124 Encounter for screening for malignant neoplasm of cervix: Secondary | ICD-10-CM | POA: Diagnosis not present

## 2018-07-24 DIAGNOSIS — Z1151 Encounter for screening for human papillomavirus (HPV): Secondary | ICD-10-CM | POA: Diagnosis not present

## 2018-08-12 MED FILL — LETROZOLE 2.5 MG TABLET: 2.5 | 5 days supply | Qty: 15 | Fill #1

## 2018-08-14 MED FILL — busPIRone HCL 5 MG TABS: 5 | 30 days supply | Qty: 60 | Fill #0

## 2018-08-21 MED FILL — metFORMIN HCL ER 500 MG TB2: 500 | 30 days supply | Qty: 120 | Fill #0

## 2018-09-28 MED FILL — metFORMIN HCL ER 500 MG TB2: 500 | 30 days supply | Qty: 120 | Fill #1

## 2018-09-28 MED FILL — busPIRone HCL 5 MG TABS: 5 | 30 days supply | Qty: 60 | Fill #1

## 2018-11-02 MED FILL — metFORMIN HCL ER 500 MG TB2: 500 | 30 days supply | Qty: 120 | Fill #2

## 2018-11-18 DIAGNOSIS — Z3201 Encounter for pregnancy test, result positive: Secondary | ICD-10-CM | POA: Diagnosis not present

## 2018-11-18 DIAGNOSIS — Z3200 Encounter for pregnancy test, result unknown: Secondary | ICD-10-CM | POA: Diagnosis not present

## 2018-11-23 DIAGNOSIS — Z3200 Encounter for pregnancy test, result unknown: Secondary | ICD-10-CM | POA: Diagnosis not present

## 2018-12-03 MED FILL — busPIRone HCL 5 MG TABS: 5 | 30 days supply | Qty: 60 | Fill #2

## 2018-12-07 DIAGNOSIS — O0900 Supervision of pregnancy with history of infertility, unspecified trimester: Secondary | ICD-10-CM | POA: Diagnosis not present

## 2018-12-07 DIAGNOSIS — O3431 Maternal care for cervical incompetence, first trimester: Secondary | ICD-10-CM | POA: Diagnosis not present

## 2018-12-15 DIAGNOSIS — Z3689 Encounter for other specified antenatal screening: Secondary | ICD-10-CM | POA: Diagnosis not present

## 2018-12-15 DIAGNOSIS — O09521 Supervision of elderly multigravida, first trimester: Secondary | ICD-10-CM | POA: Diagnosis not present

## 2018-12-15 LAB — OB RESULTS CONSOLE HEPATITIS B SURFACE ANTIGEN: Hepatitis B Surface Ag: NEGATIVE

## 2018-12-15 LAB — OB RESULTS CONSOLE ABO/RH: RH Type: NEGATIVE

## 2018-12-15 LAB — OB RESULTS CONSOLE RPR: RPR: NONREACTIVE

## 2018-12-15 LAB — OB RESULTS CONSOLE HIV ANTIBODY (ROUTINE TESTING): HIV: NONREACTIVE

## 2018-12-15 LAB — OB RESULTS CONSOLE RUBELLA ANTIBODY, IGM: Rubella: IMMUNE

## 2018-12-15 LAB — OB RESULTS CONSOLE ANTIBODY SCREEN: Antibody Screen: NEGATIVE

## 2018-12-16 DIAGNOSIS — O09521 Supervision of elderly multigravida, first trimester: Secondary | ICD-10-CM | POA: Diagnosis not present

## 2018-12-16 DIAGNOSIS — Z3689 Encounter for other specified antenatal screening: Secondary | ICD-10-CM | POA: Diagnosis not present

## 2018-12-21 DIAGNOSIS — O3430 Maternal care for cervical incompetence, unspecified trimester: Secondary | ICD-10-CM | POA: Diagnosis not present

## 2018-12-21 DIAGNOSIS — N883 Incompetence of cervix uteri: Secondary | ICD-10-CM | POA: Diagnosis not present

## 2018-12-22 ENCOUNTER — Other Ambulatory Visit: Payer: Self-pay

## 2018-12-22 ENCOUNTER — Other Ambulatory Visit
Admission: RE | Admit: 2018-12-22 | Discharge: 2018-12-22 | Disposition: A | Discharge status: Home / Self Care | Payer: 59 | Source: Ambulatory Visit | Attending: Obstetrics and Gynecology | Admitting: Obstetrics and Gynecology

## 2018-12-22 DIAGNOSIS — Z20828 Contact with and (suspected) exposure to other viral communicable diseases: Secondary | ICD-10-CM | POA: Insufficient documentation

## 2018-12-22 MED FILL — INDOMETHACIN 25 MG CAPSULE: 25 | 2 days supply | Qty: 6 | Fill #0

## 2018-12-22 MED FILL — ONDANSETRON HCL 4 MG TABLET: 4 | 2 days supply | Qty: 10 | Fill #0

## 2018-12-22 MED FILL — OXYCODONE-ACETAMINOPHEN 5-3: 5-325 | 2 days supply | Qty: 10 | Fill #0

## 2018-12-23 ENCOUNTER — Encounter (HOSPITAL_BASED_OUTPATIENT_CLINIC_OR_DEPARTMENT_OTHER): Payer: Self-pay | Admitting: *Deleted

## 2018-12-23 ENCOUNTER — Other Ambulatory Visit: Payer: Self-pay

## 2018-12-23 LAB — SARS CORONAVIRUS 2 (TAT 6-24 HRS): SARS Coronavirus 2: NEGATIVE

## 2018-12-23 NOTE — Progress Notes (Addendum)
Spoke w/ pt via phone for pre-op interview.  Npo after mn.  Arrive at 1045.  Pre-op orders pending.  Pt had covid test done yesterday.  Will take buspar am dos w/ sips of water.  Pt is [redacted] wks gestation this week.

## 2018-12-24 ENCOUNTER — Other Ambulatory Visit: Payer: Self-pay | Admitting: Obstetrics and Gynecology

## 2018-12-24 ENCOUNTER — Other Ambulatory Visit (HOSPITAL_COMMUNITY): Payer: Self-pay | Admitting: Obstetrics and Gynecology

## 2018-12-24 DIAGNOSIS — Z3A11 11 weeks gestation of pregnancy: Secondary | ICD-10-CM

## 2018-12-25 ENCOUNTER — Ambulatory Visit (HOSPITAL_COMMUNITY): Admission: RE | Admit: 2018-12-25 | Payer: 59 | Source: Ambulatory Visit

## 2018-12-25 ENCOUNTER — Encounter (HOSPITAL_BASED_OUTPATIENT_CLINIC_OR_DEPARTMENT_OTHER)
Admission: RE | Disposition: A | Discharge status: Home / Self Care | Payer: Self-pay | Source: Home / Self Care | Attending: Obstetrics and Gynecology

## 2018-12-25 ENCOUNTER — Ambulatory Visit (HOSPITAL_BASED_OUTPATIENT_CLINIC_OR_DEPARTMENT_OTHER): Payer: 59 | Admitting: Certified Registered"

## 2018-12-25 ENCOUNTER — Other Ambulatory Visit (HOSPITAL_COMMUNITY): Payer: Self-pay | Admitting: Obstetrics and Gynecology

## 2018-12-25 ENCOUNTER — Ambulatory Visit (HOSPITAL_COMMUNITY)
Admission: RE | Admit: 2018-12-25 | Discharge: 2018-12-25 | Disposition: A | Discharge status: Home / Self Care | Payer: 59 | Source: Ambulatory Visit | Attending: Obstetrics and Gynecology | Admitting: Obstetrics and Gynecology

## 2018-12-25 ENCOUNTER — Other Ambulatory Visit: Payer: Self-pay

## 2018-12-25 ENCOUNTER — Encounter (HOSPITAL_BASED_OUTPATIENT_CLINIC_OR_DEPARTMENT_OTHER): Payer: Self-pay | Admitting: Anesthesiology

## 2018-12-25 ENCOUNTER — Ambulatory Visit (HOSPITAL_BASED_OUTPATIENT_CLINIC_OR_DEPARTMENT_OTHER)
Admission: RE | Admit: 2018-12-25 | Discharge: 2018-12-25 | Disposition: A | Discharge status: Home / Self Care | Payer: 59 | Attending: Obstetrics and Gynecology | Admitting: Obstetrics and Gynecology

## 2018-12-25 DIAGNOSIS — O3431 Maternal care for cervical incompetence, first trimester: Secondary | ICD-10-CM | POA: Insufficient documentation

## 2018-12-25 DIAGNOSIS — O99341 Other mental disorders complicating pregnancy, first trimester: Secondary | ICD-10-CM | POA: Insufficient documentation

## 2018-12-25 DIAGNOSIS — F419 Anxiety disorder, unspecified: Secondary | ICD-10-CM | POA: Insufficient documentation

## 2018-12-25 DIAGNOSIS — Z3A11 11 weeks gestation of pregnancy: Secondary | ICD-10-CM

## 2018-12-25 HISTORY — DX: Presence of spectacles and contact lenses: Z97.3

## 2018-12-25 HISTORY — DX: Incompetence of cervix uteri: N88.3

## 2018-12-25 HISTORY — DX: Personal history of other diseases of urinary system: Z87.448

## 2018-12-25 HISTORY — DX: Exercise induced bronchospasm: J45.990

## 2018-12-25 HISTORY — PX: ABDOMINAL CERCLAGE: SHX5384

## 2018-12-25 LAB — TYPE AND SCREEN
ABO/RH(D): O NEG
Antibody Screen: NEGATIVE

## 2018-12-25 LAB — ABO/RH: ABO/RH(D): O NEG

## 2018-12-25 SURGERY — CERCLAGE, CERVIX, ABDOMINAL APPROACH
Anesthesia: General | Site: Abdomen

## 2018-12-25 MED ORDER — PROPOFOL 10 MG/ML IV BOLUS
INTRAVENOUS | Status: DC | PRN
  Administered 2018-12-25: 150 mg via INTRAVENOUS

## 2018-12-25 MED ORDER — CEFAZOLIN SODIUM-DEXTROSE 2-4 GM/100ML-% IV SOLN
INTRAVENOUS | Status: AC
  Filled 2018-12-25: qty 100

## 2018-12-25 MED ORDER — SODIUM CHLORIDE 0.9 % IR SOLN
Status: DC | PRN
  Administered 2018-12-25: 1000 mL

## 2018-12-25 MED ORDER — OXYCODONE HCL 5 MG/5ML PO SOLN
5.0000 mg | Freq: Once | ORAL | Status: AC | PRN
  Filled 2018-12-25: qty 5

## 2018-12-25 MED ORDER — LIDOCAINE 2% (20 MG/ML) 5 ML SYRINGE
INTRAMUSCULAR | Status: DC | PRN
  Administered 2018-12-25: 50 mg via INTRAVENOUS

## 2018-12-25 MED ORDER — LACTATED RINGERS IV SOLN
INTRAVENOUS | Status: DC
  Administered 2018-12-25: 1000 mL via INTRAVENOUS
  Filled 2018-12-25: qty 1000

## 2018-12-25 MED ORDER — ONDANSETRON HCL 4 MG/2ML IJ SOLN
INTRAMUSCULAR | Status: AC
  Filled 2018-12-25: qty 2

## 2018-12-25 MED ORDER — ROCURONIUM BROMIDE 10 MG/ML (PF) SYRINGE
PREFILLED_SYRINGE | INTRAVENOUS | Status: AC
  Filled 2018-12-25: qty 10

## 2018-12-25 MED ORDER — DEXAMETHASONE SODIUM PHOSPHATE 10 MG/ML IJ SOLN
INTRAMUSCULAR | Status: DC | PRN
  Administered 2018-12-25: 5 mg via INTRAVENOUS

## 2018-12-25 MED ORDER — ROCURONIUM BROMIDE 100 MG/10ML IV SOLN
INTRAVENOUS | Status: DC | PRN
  Administered 2018-12-25: 10 mg via INTRAVENOUS
  Administered 2018-12-25: 50 mg via INTRAVENOUS

## 2018-12-25 MED ORDER — FENTANYL CITRATE (PF) 100 MCG/2ML IJ SOLN
25.0000 ug | INTRAMUSCULAR | Status: DC | PRN
  Administered 2018-12-25 (×2): 50 ug via INTRAVENOUS
  Filled 2018-12-25: qty 1

## 2018-12-25 MED ORDER — OXYCODONE HCL 5 MG PO TABS
ORAL_TABLET | ORAL | Status: AC
  Filled 2018-12-25: qty 1

## 2018-12-25 MED ORDER — BUPIVACAINE HCL 0.25 % IJ SOLN
INTRAMUSCULAR | Status: DC | PRN
  Administered 2018-12-25: 17 mL

## 2018-12-25 MED ORDER — INDOMETHACIN 50 MG RE SUPP
50.0000 mg | Freq: Once | RECTAL | Status: AC
  Administered 2018-12-25: 50 mg via RECTAL
  Filled 2018-12-25 (×2): qty 1

## 2018-12-25 MED ORDER — CEFAZOLIN SODIUM-DEXTROSE 2-4 GM/100ML-% IV SOLN
2.0000 g | INTRAVENOUS | Status: AC
  Administered 2018-12-25: 2 g via INTRAVENOUS
  Filled 2018-12-25: qty 100

## 2018-12-25 MED ORDER — KETOROLAC TROMETHAMINE 30 MG/ML IJ SOLN
INTRAMUSCULAR | Status: AC
  Filled 2018-12-25: qty 1

## 2018-12-25 MED ORDER — LIDOCAINE 2% (20 MG/ML) 5 ML SYRINGE
INTRAMUSCULAR | Status: AC
  Filled 2018-12-25: qty 5

## 2018-12-25 MED ORDER — SUGAMMADEX SODIUM 200 MG/2ML IV SOLN
INTRAVENOUS | Status: DC | PRN
  Administered 2018-12-25: 200 mg via INTRAVENOUS

## 2018-12-25 MED ORDER — FENTANYL CITRATE (PF) 100 MCG/2ML IJ SOLN
INTRAMUSCULAR | Status: DC | PRN
  Administered 2018-12-25 (×2): 50 ug via INTRAVENOUS

## 2018-12-25 MED ORDER — PROPOFOL 10 MG/ML IV BOLUS
INTRAVENOUS | Status: AC
  Filled 2018-12-25: qty 40

## 2018-12-25 MED ORDER — DEXAMETHASONE SODIUM PHOSPHATE 10 MG/ML IJ SOLN
INTRAMUSCULAR | Status: AC
  Filled 2018-12-25: qty 1

## 2018-12-25 MED ORDER — ONDANSETRON HCL 4 MG/2ML IJ SOLN
4.0000 mg | Freq: Four times a day (QID) | INTRAMUSCULAR | Status: AC | PRN
  Administered 2018-12-25: 14:00:00 4 mg via INTRAVENOUS
  Filled 2018-12-25: qty 2

## 2018-12-25 MED ORDER — KETOROLAC TROMETHAMINE 30 MG/ML IJ SOLN
INTRAMUSCULAR | Status: DC | PRN
  Administered 2018-12-25: 30 mg via INTRAVENOUS

## 2018-12-25 MED ORDER — FENTANYL CITRATE (PF) 100 MCG/2ML IJ SOLN
INTRAMUSCULAR | Status: AC
  Filled 2018-12-25: qty 2

## 2018-12-25 MED ORDER — OXYCODONE HCL 5 MG PO TABS
5.0000 mg | ORAL_TABLET | Freq: Once | ORAL | Status: AC | PRN
  Administered 2018-12-25: 5 mg via ORAL
  Filled 2018-12-25: qty 1

## 2018-12-25 SURGICAL SUPPLY — 38 items
APPLICATOR ARISTA FLEXITIP XL (MISCELLANEOUS) IMPLANT
CABLE HIGH FREQUENCY MONO STRZ (ELECTRODE) IMPLANT
COVER MAYO STAND STRL (DRAPES) ×3 IMPLANT
COVER WAND RF STERILE (DRAPES) ×3 IMPLANT
DERMABOND ADVANCED (GAUZE/BANDAGES/DRESSINGS) ×2
DERMABOND ADVANCED .7 DNX12 (GAUZE/BANDAGES/DRESSINGS) ×1 IMPLANT
DEVICE TROCAR PUNCTURE CLOSURE (ENDOMECHANICALS) ×6 IMPLANT
DRSG COVADERM PLUS 2X2 (GAUZE/BANDAGES/DRESSINGS) IMPLANT
DRSG OPSITE POSTOP 3X4 (GAUZE/BANDAGES/DRESSINGS) IMPLANT
DURAPREP 26ML APPLICATOR (WOUND CARE) ×3 IMPLANT
ELECT REM PT RETURN 9FT ADLT (ELECTROSURGICAL) ×3
ELECTRODE REM PT RTRN 9FT ADLT (ELECTROSURGICAL) ×1 IMPLANT
GAUZE 4X4 16PLY RFD (DISPOSABLE) ×3 IMPLANT
GLOVE BIO SURGEON STRL SZ8 (GLOVE) ×3 IMPLANT
GOWN STRL REUS W/TWL LRG LVL3 (GOWN DISPOSABLE) ×6 IMPLANT
HEMOSTAT ARISTA ABSORB 3G PWDR (HEMOSTASIS) IMPLANT
KIT TURNOVER CYSTO (KITS) ×3 IMPLANT
NEEDLE INSUFFLATION 120MM (ENDOMECHANICALS) ×3 IMPLANT
PACK LAPAROSCOPY BASIN (CUSTOM PROCEDURE TRAY) ×3 IMPLANT
PACK TRENDGUARD 450 HYBRID PRO (MISCELLANEOUS) ×1 IMPLANT
PENCIL BUTTON HOLSTER BLD 10FT (ELECTRODE) ×3 IMPLANT
RETRACTOR LAPSCP 12X46 CVD (ENDOMECHANICALS) ×1 IMPLANT
RTRCTR LAPSCP 12X46 CVD (ENDOMECHANICALS) ×3
SET IRRIG TUBING LAPAROSCOPIC (IRRIGATION / IRRIGATOR) ×3 IMPLANT
SHEARS HARMONIC ACE PLUS 36CM (ENDOMECHANICALS) ×3 IMPLANT
SUT MERSILENE 5MM BP 1 12 (SUTURE) ×3 IMPLANT
SUT MNCRL AB 4-0 PS2 18 (SUTURE) ×3 IMPLANT
SUT SILK 2 0 30  PSL (SUTURE) ×2
SUT SILK 2 0 30 PSL (SUTURE) ×1 IMPLANT
SUT VIC AB 2-0 UR6 27 (SUTURE) ×3 IMPLANT
TOWEL OR 17X26 10 PK STRL BLUE (TOWEL DISPOSABLE) ×3 IMPLANT
TRAY FOLEY W/BAG SLVR 14FR (SET/KITS/TRAYS/PACK) ×3 IMPLANT
TRENDGUARD 450 HYBRID PRO PACK (MISCELLANEOUS) ×3
TROCAR OPTI TIP 5M 100M (ENDOMECHANICALS) ×9 IMPLANT
TROCAR XCEL 12X100 BLDLESS (ENDOMECHANICALS) ×3 IMPLANT
TROCAR XCEL DIL TIP R 11M (ENDOMECHANICALS) IMPLANT
TUBING EVAC SMOKE HEATED PNEUM (TUBING) ×3 IMPLANT
WARMER LAPAROSCOPE (MISCELLANEOUS) ×3 IMPLANT

## 2018-12-25 NOTE — Anesthesia Preprocedure Evaluation (Addendum)
Anesthesia Evaluation  Patient identified by MRN, date of birth, ID band Patient awake    Reviewed: Allergy & Precautions, H&P , NPO status , Patient's Chart, lab work & pertinent test results  Airway Mallampati: II  TM Distance: >3 FB Neck ROM: full    Dental  (+) Teeth Intact, Dental Advisory Given   Pulmonary asthma ,    breath sounds clear to auscultation       Cardiovascular negative cardio ROS   Rhythm:regular Rate:Normal     Neuro/Psych PSYCHIATRIC DISORDERS Anxiety Depression    GI/Hepatic   Endo/Other    Renal/GU      Musculoskeletal   Abdominal   Peds  Hematology   Anesthesia Other Findings   Reproductive/Obstetrics (+) Pregnancy 11 weeks IUP                            Anesthesia Physical Anesthesia Plan  ASA: II  Anesthesia Plan: General   Post-op Pain Management:    Induction: Intravenous  PONV Risk Score and Plan: 3 and Ondansetron, Dexamethasone and Treatment may vary due to age or medical condition  Airway Management Planned: LMA  Additional Equipment:   Intra-op Plan:   Post-operative Plan:   Informed Consent: I have reviewed the patients History and Physical, chart, labs and discussed the procedure including the risks, benefits and alternatives for the proposed anesthesia with the patient or authorized representative who has indicated his/her understanding and acceptance.       Plan Discussed with: CRNA, Anesthesiologist and Surgeon  Anesthesia Plan Comments:         Anesthesia Quick Evaluation

## 2018-12-25 NOTE — Anesthesia Procedure Notes (Signed)
Procedure Name: Intubation Date/Time: 12/25/2018 12:46 PM Performed by: Wanita Chamberlain, CRNA Pre-anesthesia Checklist: Patient identified, Emergency Drugs available, Suction available, Patient being monitored and Timeout performed Patient Re-evaluated:Patient Re-evaluated prior to induction Oxygen Delivery Method: Circle system utilized Preoxygenation: Pre-oxygenation with 100% oxygen Induction Type: IV induction Ventilation: Mask ventilation without difficulty Laryngoscope Size: Mac and 3 Grade View: Grade I Tube type: Oral Number of attempts: 1 Airway Equipment and Method: Stylet Placement Confirmation: breath sounds checked- equal and bilateral,  CO2 detector,  positive ETCO2 and ETT inserted through vocal cords under direct vision Secured at: 21 cm Tube secured with: Tape Dental Injury: Teeth and Oropharynx as per pre-operative assessment

## 2018-12-25 NOTE — H&P (Addendum)
Kimberly Delgado is a 33 y.o. female , originally referred to me by Dr. Garwin Brothers, for laparoscopic cervico isthmic cerclage during pregnancy.  He is currently [redacted] weeks pregnant.  She has history of cervical insufficiency and her last pregnancy was managed with elective transvaginal cerclage but she still delivered prematurely.  Patient has weighed the benefits and risks of transabdominal cerclage versus elective trans-vaginal cerclage.  Given the advantage of transabdominal cerclage that it does not require modified bedrest postoperatively, allowing her to work and care for her toddler, she has opted for transabdominal cerclage.  Pertinent Gynecological History:  Contraception: none DES exposure: denies Blood transfusions: none Sexually transmitted diseases: no past history Previous GYN Procedures: none Last pap: normal  OB History:    Menstrual History: Menarche age: 80    Past Medical History:  Diagnosis Date  . Anxiety   . Depression   . Exercise-induced asthma    per pt no inhaler  . History of pyelonephritis   . Incompetent cervix    12-23-2018  first trimester  . Seasonal allergies   . Wears contact lenses                     Past Surgical History:  Procedure Laterality Date  . CERVICAL CERCLAGE N/A 02/17/2015   Procedure: CERCLAGE CERVICAL;  Surgeon: Servando Salina, MD;  Location: [facility];  Service: Gynecology;  Laterality: N/A;  EDD: 08/16/15  . WISDOM TOOTH EXTRACTION               Family History  Problem Relation Age of Onset  . Diabetes Other   . Diabetes Mother   . Diabetes Maternal Grandfather   . Asthma Maternal Grandfather    No hereditary disease.  No cancer of breast, ovary, uterus.   Social History   Socioeconomic History  . Marital status: Married    Spouse name: Not on file  . Number of children: Not on file  . Years of education: Not on file  . Highest education level: Not on file  Occupational History  . Not on file  Social  Needs  . Financial resource strain: Not on file  . Food insecurity    Worry: Not on file    Inability: Not on file  . Transportation needs    Medical: Not on file    Non-medical: Not on file  Tobacco Use  . Smoking status: Never Smoker  . Smokeless tobacco: Never Used  Substance and Sexual Activity  . Alcohol use: Not Currently  . Drug use: Never  . Sexual activity: Yes    Birth control/protection: None  Lifestyle  . Physical activity    Days per week: Not on file    Minutes per session: Not on file  . Stress: Not on file  Relationships  . Social Herbalist on phone: Not on file    Gets together: Not on file    Attends religious service: Not on file    Active member of club or organization: Not on file    Attends meetings of clubs or organizations: Not on file    Relationship status: Not on file  . Intimate partner violence    Fear of current or ex partner: Not on file    Emotionally abused: Not on file    Physically abused: Not on file    Forced sexual activity: Not on file  Other Topics Concern  . Not on file  Social History Narrative   **  Merged History Encounter **        Allergies  Allergen Reactions  . Latex Rash    No current facility-administered medications on file prior to encounter.    Current Outpatient Medications on File Prior to Encounter  Medication Sig Dispense Refill  . acetaminophen (TYLENOL) 325 MG tablet Take 650 mg by mouth 4 (four) times daily as needed.    . busPIRone (BUSPAR) 5 MG tablet Take 5 mg by mouth daily.    . Prenatal Vit-Fe Fumarate-FA (PRENATAL MULTIVITAMIN) TABS tablet Take 1 tablet by mouth daily at 12 noon.        Review of Systems  Constitutional: Negative.   HENT: Negative.   Eyes: Negative.   Respiratory: Negative.   Cardiovascular: Negative.   Gastrointestinal: Negative.   Genitourinary: Negative.   Musculoskeletal: Negative.   Skin: Negative.   Neurological: Negative.   Endo/Heme/Allergies:  Negative.   Psychiatric/Behavioral: Negative.      Physical Exam  BP 112/78   Pulse 72   Temp 98 F (36.7 C) (Oral)   Resp 16   Ht 5\' 6"  (1.676 m)   Wt 61.9 kg   LMP 10/07/2018 (Approximate)   SpO2 100%   Breastfeeding No   BMI 22.02 kg/m  Constitutional: She is oriented to person, place, and time. She appears well-developed and well-nourished.  HENT:  Head: Normocephalic and atraumatic.  Nose: Nose normal.  Mouth/Throat: Oropharynx is clear and moist. No oropharyngeal exudate.  Eyes: Conjunctivae normal and EOM are normal. Pupils are equal, round, and reactive to light. No scleral icterus.  Neck: Normal range of motion. Neck supple. No tracheal deviation present. No thyromegaly present.  Cardiovascular: Normal rate.   Respiratory: Effort normal and breath sounds normal.  GI: Soft. Bowel sounds are normal. She exhibits no distension and no mass. There is no tenderness.  Lymphadenopathy:    She has no cervical adenopathy.  Neurological: She is alert and oriented to person, place, and time. She has normal reflexes.  Skin: Skin is warm.  Psychiatric: She has a normal mood and affect. Her behavior is normal. Judgment and thought content normal.     Assessment/Plan:  Intrauterine pregnancy at 11 weeks with history of cervical insufficiency History of preterm delivery despite elective transvaginal cerclage Patient is preop laparoscopy, transabdominal cervico-sthmic cerclage Benefits, risks, and alternatives to the proposed procedure were discussed with the patient again.  All of her questions were answered. Patient understands that a cesarean delivery is indicated with such a cerclage.  Governor Specking, MD

## 2018-12-25 NOTE — Discharge Instructions (Signed)
Laparoscopy post care This sheet gives you information about how to care for yourself after your procedure. Your health care provider may also give you more specific instructions. If you have problems or questions, contact your health care provider. What can I expect after the procedure? After the procedure, it is common to have:  A sore throat.  Discomfort in your shoulder.  Mild discomfort or cramping in your abdomen.  Gas pains.  Pain or soreness in the area where the surgical incision was made.  A bloated feeling.  Tiredness.  Nausea.  Vomiting. Follow these instructions at home: Medicines  Take over-the-counter and prescription medicines only as told by your health care provider.  Do not take aspirin because it can cause bleeding.  Ask your health care provider if the medicine prescribed to you: ? Requires you to avoid driving or using heavy machinery. ? Can cause constipation. You may need to take actions to prevent or treat constipation, such as:  Drink enough fluid to keep your urine pale yellow.  Take over-the-counter or prescription medicines.  Eat foods that are high in fiber, such as beans, whole grains, and fresh fruits and vegetables.  Limit foods that are high in fat and processed sugars, such as fried or sweet foods. Incision care      Follow instructions from your health care provider about how to take care of your incision. Make sure you: ? Wash your hands with soap and water before and after you change your bandage (dressing). If soap and water are not available, use hand sanitizer. ? Change your dressing as told by your health care provider. ? Leave stitches (sutures), skin glue, or adhesive strips in place. These skin closures may need to stay in place for 2 weeks or longer. If adhesive strip edges start to loosen and curl up, you may trim the loose edges. Do not remove adhesive strips completely unless your health care provider tells you to do  that.  Check your incision area every day for signs of infection. Check for: ? Redness, swelling, or pain. ? Fluid or blood. ? Warmth. ? Pus or a bad smell. Activity  Rest as told by your health care provider.  Avoid sitting for a long time without moving. Get up to take short walks every 1-2 hours. This is important to improve blood flow and breathing. Ask for help if you feel weak or unsteady.  Return to your normal activities as told by your health care provider. Ask your health care provider what activities are safe for you. General instructions  Do not take baths, swim, or use a hot tub until your health care provider approves. Ask your health care provider if you may take showers. You may only be allowed to take sponge baths.  Have someone help you with your daily household tasks for the first few days.  Keep all follow-up visits as told by your health care provider. This is important. Contact a health care provider if:  You have redness, swelling, or pain around your incision.  Your incision feels warm to the touch.  You have pus or a bad smell coming from your incision.  The edges of your incision break open after the sutures have been removed.  Your pain does not improve after 2-3 days.  You have a rash.  You repeatedly become dizzy or light-headed.  Your pain medicine is not helping. Get help right away if you:  Have a fever.  Faint.  Have increasing pain in  your abdomen.  Have severe pain in one or both of your shoulders.  Have fluid or blood coming from your sutures or from your vagina.  Have shortness of breath or difficulty breathing.  Have chest pain or leg pain.  Have ongoing nausea, vomiting, or diarrhea. Summary  After the procedure, it is common to have mild discomfort or cramping in your abdomen.  Take over-the-counter and prescription medicines only as told by your health care provider.  Watch for symptoms that should prompt you to  call your health care provider.  Keep all follow-up visits as told by your health care provider. This is important. This information is not intended to replace advice given to you by your health care provider. Make sure you discuss any questions you have with your health care provider. Document Released: 11/30/2004 Document Revised: 04/07/2018 Document Reviewed: 04/07/2018 Elsevier Patient Education  [place] Instructions  Activity: Get plenty of rest for the remainder of the day. A responsible individual must stay with you for 24 hours following the procedure.  For the next 24 hours, DO NOT: -Drive a car -Paediatric nurse -Drink alcoholic beverages -Take any medication unless instructed by your physician -Make any legal decisions or sign important papers.  Meals: Start with liquid foods such as gelatin or soup. Progress to regular foods as tolerated. Avoid greasy, spicy, heavy foods. If nausea and/or vomiting occur, drink only clear liquids until the nausea and/or vomiting subsides. Call your physician if vomiting continues.  Special Instructions/Symptoms: Your throat may feel dry or sore from the anesthesia or the breathing tube placed in your throat during surgery. If this causes discomfort, gargle with warm salt water. The discomfort should disappear within 24 hours.  If you had a scopolamine patch placed behind your ear for the management of post- operative nausea and/or vomiting:  1. The medication in the patch is effective for 72 hours, after which it should be removed.  Wrap patch in a tissue and discard in the trash. Wash hands thoroughly with soap and water. 2. You may remove the patch earlier than 72 hours if you experience unpleasant side effects which may include dry mouth, dizziness or visual disturbances. 3. Avoid touching the patch. Wash your hands with soap and water after contact with the patch.

## 2018-12-25 NOTE — Anesthesia Postprocedure Evaluation (Signed)
Anesthesia Post Note  Patient: Kimberly Delgado  Procedure(s) Performed: TRANS CERCLAGE ABDOMINAL (N/A Abdomen)     Patient location during evaluation: PACU Anesthesia Type: General Level of consciousness: awake and alert and oriented Pain management: pain level controlled Vital Signs Assessment: post-procedure vital signs reviewed and stable Respiratory status: spontaneous breathing, nonlabored ventilation and respiratory function stable Cardiovascular status: blood pressure returned to baseline and stable Postop Assessment: no apparent nausea or vomiting Anesthetic complications: no    Last Vitals:  Vitals:   12/25/18 1500 12/25/18 1515  BP: 107/85 104/76  Pulse: 69 68  Resp: 18 18  Temp:    SpO2: 100% 98%    Last Pain:  Vitals:   12/25/18 1510  TempSrc:   PainSc: 6                  Beacher Every A.

## 2018-12-25 NOTE — Transfer of Care (Signed)
Immediate Anesthesia Transfer of Care Note  Patient: Kimberly Delgado  Procedure(s) Performed: TRANS CERCLAGE ABDOMINAL (N/A Abdomen)  Patient Location: PACU  Anesthesia Type:General  Level of Consciousness: awake, alert , oriented and patient cooperative  Airway & Oxygen Therapy: Patient Spontanous Breathing and Patient connected to nasal cannula oxygen  Post-op Assessment: Report given to RN and Post -op Vital signs reviewed and stable  Post vital signs: Reviewed and stable  Last Vitals:  Vitals Value Taken Time  BP 116/75 12/25/18 1440  Temp    Pulse 70 12/25/18 1444  Resp 15 12/25/18 1444  SpO2 100 % 12/25/18 1444  Vitals shown include unvalidated device data.  Last Pain:  Vitals:   12/25/18 1121  TempSrc:   PainSc: 0-No pain      Patients Stated Pain Goal: 5 (25/36/64 4034)  Complications: No apparent anesthesia complications

## 2018-12-25 NOTE — Op Note (Signed)
OPERATIVE NOTE  Preoperative diagnosis: Cervical insufficiency, intrauterine pregnancy at 11 weeks  Postoperative diagnosis: Cervical insufficiency, intrauterine pregnancy at 11 weeks  Procedure: Laparoscopy, transabdominal cervical isthmic cerclage, intraoperative ultrasound guidance  Anesthesia: Gen. Endotracheal  Surgeon:Vada Swift Kerin Perna, MD  Assistant: Sullivan Lone, RNFA  Complications: None  Estimated blood loss: 20 cc  Findings: On exam under anesthesia the cervix was closed.  On laparoscopy the liver edges were normal.  The appendix appeared normal. The uterus was gravid both tubes and ovaries appeared normal. Intraoperative ultrasound transvaginally showed a singleton intrauterine pregnancy at length consistent with 11 weeks. There was normal amniotic fluid volume, fetal cardiac activity and movements.  Description of the procedure: The patient was placed in dorsal supine position and general endotracheal anesthesia was given. She was then placed in lithotomy position and the abdomen was prepped and draped inside manner. A Foley catheter was inserted into the bladder. An intraumbilical 5 mm vertical skin incision was made after preemptive anesthesia of all toes incisions with 0.25% bupivacaine. A Verress needle was inserted. A pneumoperitoneum was created with carbon dioxide. A 5 mm trocar and then a corresponding laparoscope with 30 angle was inserted and video laparoscopy was started.  Under direct visualization 2 more 5 mm lower quadrant incisions were made and corresponding trochars were placed. A fourth 12 mm trocar was placed in the left upper quadrant for the Endopaddle retractor.  Above findings were noted. A #5 Mersilene tape was prepared by cutting the needles off just at the swaged end and creating a loop of 2-0 silk at each end to allow carrying the end of the Mersilene tape through the tissue during the cerclage. This Mersilene tape was then dropped into the posterior  cul-de-sac. The patient was placed in Trendelenburg position and the uterus was gently pressed posteriorly and superiorly with the EndoPaddle retractor while the bladder flap was created with the electrosurgical needle, set at 68 W cutting current. After blunt and sharp dissection the cervico-isthmic junction could be well visualized. Next a stab wound incision was made 1 cm from the midline immediately suprapubically and an Endo Close ligature carrier was passed through the abdominal wall and pain and at the right side of the cervical isthmic junction. At this point of passing the tip of the Endo Close through the parametrium the uterus was hammocked on the EndoPaddle device and lifted anteriorly, allowing visualization of the posterior lower uterine segment. The tip of the Endo Close device was brought out posteriorly just above the medial insertion point of the right uterosacral ligaments onto the uterus. 1 end of the Mersilene tape was grasped with the Endo Close device and brought out anteriorly. The same steps were repeated with a new Endo Close device on the left anterior aspect of the cervico-isthmic junction, immediately medial to the uterine blood vessels, and the other end of the Mersilene tape was grasped with the ligature carrier device and brought out anteriorly. Intraoperative transvaginal ultrasound guidance confirmed correct extra chorionic placement of the cerclage, as well as the fetal viability. When the cerclage suture was tied with a surgeon's knot followed by 4 additional knots under transvaginal ultrasound guidance again the correct placement was confirmed. The resulting cervical cerclage produced a cervical length of 3.7 cm. The AP and transverse diameters of the cerclage by ultrasound was 24 and 23 mm respectively. The ends of the Mersilene tape was trimmed off and removed from the pelvis. Good hemostasis was insured. The pelvis was copiously irrigated and aspirated. The gas  was allowed  to escape. A 0 Vicryl fascial figure of 8 suture was placed on the 12 mm incision. All skin incisions were approximated with 4-0 Monocryl in subcuticular stitches. The patient tolerated the procedure well and was transferred to recovery room in satisfactory condition.  Governor Specking, MD

## 2018-12-28 ENCOUNTER — Encounter (HOSPITAL_BASED_OUTPATIENT_CLINIC_OR_DEPARTMENT_OTHER): Payer: Self-pay | Admitting: Obstetrics and Gynecology

## 2019-01-22 DIAGNOSIS — O09212 Supervision of pregnancy with history of pre-term labor, second trimester: Secondary | ICD-10-CM | POA: Diagnosis not present

## 2019-01-27 DIAGNOSIS — Z361 Encounter for antenatal screening for raised alphafetoprotein level: Secondary | ICD-10-CM | POA: Diagnosis not present

## 2019-01-27 DIAGNOSIS — O3432 Maternal care for cervical incompetence, second trimester: Secondary | ICD-10-CM | POA: Diagnosis not present

## 2019-01-27 DIAGNOSIS — Z3A16 16 weeks gestation of pregnancy: Secondary | ICD-10-CM | POA: Diagnosis not present

## 2019-02-03 DIAGNOSIS — O09212 Supervision of pregnancy with history of pre-term labor, second trimester: Secondary | ICD-10-CM | POA: Diagnosis not present

## 2019-02-03 DIAGNOSIS — Z3A17 17 weeks gestation of pregnancy: Secondary | ICD-10-CM | POA: Diagnosis not present

## 2019-02-09 ENCOUNTER — Other Ambulatory Visit (HOSPITAL_COMMUNITY): Payer: Self-pay | Admitting: Obstetrics and Gynecology

## 2019-02-09 DIAGNOSIS — Z3A19 19 weeks gestation of pregnancy: Secondary | ICD-10-CM

## 2019-02-09 DIAGNOSIS — O280 Abnormal hematological finding on antenatal screening of mother: Secondary | ICD-10-CM

## 2019-02-09 DIAGNOSIS — Z363 Encounter for antenatal screening for malformations: Secondary | ICD-10-CM

## 2019-02-11 DIAGNOSIS — O09212 Supervision of pregnancy with history of pre-term labor, second trimester: Secondary | ICD-10-CM | POA: Diagnosis not present

## 2019-02-11 DIAGNOSIS — Z3A18 18 weeks gestation of pregnancy: Secondary | ICD-10-CM | POA: Diagnosis not present

## 2019-02-12 ENCOUNTER — Encounter (HOSPITAL_COMMUNITY): Payer: Self-pay | Admitting: *Deleted

## 2019-02-17 ENCOUNTER — Ambulatory Visit (HOSPITAL_COMMUNITY)
Admission: RE | Admit: 2019-02-17 | Discharge: 2019-02-17 | Disposition: A | Discharge status: Home / Self Care | Payer: 59 | Source: Ambulatory Visit | Attending: Obstetrics and Gynecology | Admitting: Obstetrics and Gynecology

## 2019-02-17 ENCOUNTER — Ambulatory Visit (HOSPITAL_COMMUNITY): Payer: Self-pay | Admitting: Genetic Counselor

## 2019-02-17 ENCOUNTER — Ambulatory Visit (HOSPITAL_COMMUNITY): Payer: 59 | Admitting: *Deleted

## 2019-02-17 ENCOUNTER — Other Ambulatory Visit: Payer: Self-pay

## 2019-02-17 ENCOUNTER — Encounter (HOSPITAL_COMMUNITY): Payer: Self-pay

## 2019-02-17 ENCOUNTER — Ambulatory Visit (HOSPITAL_BASED_OUTPATIENT_CLINIC_OR_DEPARTMENT_OTHER): Payer: 59 | Admitting: Genetic Counselor

## 2019-02-17 ENCOUNTER — Ambulatory Visit (HOSPITAL_COMMUNITY): Payer: 59

## 2019-02-17 VITALS — BP 105/76 | HR 69 | Temp 98.2°F | Wt 146.8 lb

## 2019-02-17 DIAGNOSIS — Z31430 Encounter of female for testing for genetic disease carrier status for procreative management: Secondary | ICD-10-CM | POA: Diagnosis not present

## 2019-02-17 DIAGNOSIS — O09212 Supervision of pregnancy with history of pre-term labor, second trimester: Secondary | ICD-10-CM | POA: Diagnosis not present

## 2019-02-17 DIAGNOSIS — Z315 Encounter for genetic counseling: Secondary | ICD-10-CM | POA: Diagnosis not present

## 2019-02-17 DIAGNOSIS — Z3A19 19 weeks gestation of pregnancy: Secondary | ICD-10-CM

## 2019-02-17 DIAGNOSIS — O280 Abnormal hematological finding on antenatal screening of mother: Secondary | ICD-10-CM | POA: Insufficient documentation

## 2019-02-17 DIAGNOSIS — O289 Unspecified abnormal findings on antenatal screening of mother: Secondary | ICD-10-CM | POA: Diagnosis not present

## 2019-02-17 DIAGNOSIS — Z36 Encounter for antenatal screening for chromosomal anomalies: Secondary | ICD-10-CM

## 2019-02-17 DIAGNOSIS — Z363 Encounter for antenatal screening for malformations: Secondary | ICD-10-CM | POA: Diagnosis not present

## 2019-02-17 DIAGNOSIS — O09292 Supervision of pregnancy with other poor reproductive or obstetric history, second trimester: Secondary | ICD-10-CM | POA: Diagnosis not present

## 2019-02-17 DIAGNOSIS — O285 Abnormal chromosomal and genetic finding on antenatal screening of mother: Secondary | ICD-10-CM | POA: Diagnosis not present

## 2019-02-17 DIAGNOSIS — O360120 Maternal care for anti-D [Rh] antibodies, second trimester, not applicable or unspecified: Secondary | ICD-10-CM | POA: Diagnosis not present

## 2019-02-17 NOTE — Progress Notes (Signed)
02/17/2019  CRYSTEL DEMARCO 07-17-1985 MRN: [id number] DOV: 02/17/2019  Ms. Fitzgerald presented to the [facility] for a genetics consultation regarding her abnormal quad screen results. Ms. Bangura came to her appointment alone due to COVID-19 visitor restrictions.   Indication for genetic counseling - Increased risk for trisomy 19 (Down syndrome) on quad screen  Prenatal history  Ms. Broers is a G14P1101, 33 y.o. female. Her current pregnancy has completed [redacted]w[redacted]d(Estimated Date of Delivery: 07/14/19).  Ms. VRockholtdenied exposure to environmental toxins or chemical agents. She denied the use of alcohol, tobacco or street drugs. She reported taking prenatal vitamins with iron, magnesium, and buspirone. She denied significant viral illnesses, fevers, and bleeding during the course of her pregnancy. She underwent a preventative transabdominal cerclage at 11 weeks' gestation for her history of incompetent cervix.  Family History  A three generation pedigree was drafted and reviewed. The family history is remarkable for the following:  - Ms. Bordelon reported a personal history of anxiety. We discussed that many forms of mental illness are multifactorial in nature, occurring due to a combination of genetic, lifestyle, and environmental factors. However, mental illness can appear to run in families. Recurrence risks for first-degree relatives of individuals with generalized anxiety disorder are approximately 20%.   - Ms. Debenedetto's brother has a daughter with febrile seizures. This niece's febrile seizures reportedly began after receiving her vaccinations. Up to 5% of children will have a febrile seizure at some time in their life. There is a small increased risk for febrile seizures after MMR and MMRV vaccines. Since vaccinations may have provoked seizures in this relative, risk of recurrence for seizures in Ms.  Valverde's children is likely low.  The remaining family histories were reviewed and found to be noncontributory for birth defects, intellectual disability, recurrent pregnancy loss, and known genetic conditions.    The patient's ethnicity is [place] The father of the pregnancy's ethnicity is [place] Ashkenazi Jewish ancestry and consanguinity were denied. Pedigree will be scanned under Media.  Discussion  Ms. Kunzman was referred for genetic counseling as results from quad screening came back positive for an increased risk of trisomy 250 aka Down syndrome, in the current pregnancy. Based on results from quad screening, the risk for Down syndrome in the current pregnancy increased slightly from Ms. Gellner's 1 in 279 (0.35%) age-related risk to 1 in 232 (0.4%). Additionally, the risk for trisomy 18 in the current pregnancy was not increased above her 1 in 1088 (0.1%) age-related risk, and the risk for ONTDs in the pregnancy was low (1 in 10,000).   Down syndrome is one of the most common extra chromosome conditions, as approximately 1 in 800 babies are born with this condition. Down syndrome is characterized by a characteristic facial appearance, mild to moderate mental retardation, and an increased chance for a heart defect. Approximately half of babies with Down syndrome are born with a heart defect that may require surgery after birth. Children with Down syndrome also have an increased chance for thyroid problems, which can range from an underactive to an overactive thyroid. [place] function at [place]. Also, low muscle tone, vision problems, and respiratory and ear infections are more common among babies with Down syndrome. We discussed that there are additional features associated with Down syndrome. However, prenatally we cannot accurately predict all features that would be present in an individual with  Down syndrome.  We reviewed noninvasive prenatal screening (NIPS) as an additional available screening option. Specifically, we discussed that NIPS analyzes cell free DNA originating from the placenta that is found in the maternal blood circulation during pregnancy. This test is not diagnostic for chromosome conditions, but can provide information regarding the presence or absence of extra fetal DNA for chromosomes 13, 18 and 21. Thus, it would not identify or rule out all fetal aneuploidy. The reported detection rate is greater than 99% for trisomy 21 and greater than 98% for trisomy 18, which is higher than quad screening. NIPS can also detect an additional trisomy, trisomy 28, with greater than 99% accuracy. The false positive rate is reported to be less than 0.1% for any of these conditions. Ms. Effertz indicated that she is interested in undergoing NIPS.  A complete ultrasound was performed today prior to our visit. The ultrasound report will be sent under separate cover. There were no visualized fetal anomalies or markers suggestive of aneuploidy.  Ms. Loredo was also counseled regarding diagnostic testing via amniocentesis. We discussed the technical aspects of the procedure and quoted up to a 1 in 500 (0.2%) risk for spontaneous pregnancy loss or other adverse pregnancy outcomes as a result of amniocentesis. Cultured cells from an amniocentesis sample allow for the visualization of a fetal karyotype, which can detect >99% of chromosomal aberrations. Chromosomal microarray can also be performed to identify smaller deletions or duplications of fetal chromosomal material. After careful consideration, Ms. Gable declined amniocentesis at this time. However, she indicated that she would consider undergoing amniocentesis if results from NIPS were to be abnormal.  Per ACOG recommendation, carrier screening for hemoglobinopathies, Cystic Fibrosis (CF) and Spinal Muscular Atrophy (SMA)  was discussed including information about the conditions, rationale for testing, autosomal recessive inheritance, and the option of prenatal diagnosis. Based on her ethnicity, Ms. Forrest's risk to be a carrier of CF is 1 in 31. Her risk to be a carrier of SMA is 1 in 3. Her risk to be a carrier of HBB-related hemoglobinopathies is 1 in 27. The patient was informed that select hemoglobinopathies and CF are included on [place]'s newborn screen. We discussed the Early Check research study to add SMA to her baby's newborn screening panel. Ms. Bently indicated that she was interested in pursuing this, so she was given written information on how to enroll in the Early Check study. Ms. Lasseigne also indicated that she was interested in undergoing carrier screening.  Ms. Pontillo opted to have her blood drawn for MaternT21 NIPS and carrier screening for CF, SMA, and hemoglobinopathies today. Results from NIPS will take 5-7 days to be returned. Results from carrier screening will take 2-3 weeks to be returned. I will call Ms. Sculley when results become available.  I counseled Ms. Monterroso regarding the above risks and available options. The approximate face-to-face time with the genetic counselor was 30 minutes.  In summary:  Discussed results from quad screen and options for follow-up testing  1 in 232 risk for Down syndrome in the current pregnancy  Opted to undergo NIPS. We will follow results  Discussed carrier screening for cystic fibrosis, spinal muscular atrophy, and hemoglobinopathies  Opted to undergo carrier screening. We will follow results  Reviewed results of ultrasound  No fetal anomalies or markers seen  Reduction in risk for fetal aneuploidy  Offered additional testing and screening  Declined amniocentesis at this time, but would consider in context of abnormal NIPS results  Reviewed family history concerns  Buelah Manis,  MS Genetic Counselor

## 2019-02-22 DIAGNOSIS — O09212 Supervision of pregnancy with history of pre-term labor, second trimester: Secondary | ICD-10-CM | POA: Diagnosis not present

## 2019-02-23 ENCOUNTER — Telehealth (HOSPITAL_COMMUNITY): Payer: Self-pay | Admitting: Genetic Counselor

## 2019-02-23 DIAGNOSIS — Z3A19 19 weeks gestation of pregnancy: Secondary | ICD-10-CM | POA: Diagnosis not present

## 2019-02-23 DIAGNOSIS — O09212 Supervision of pregnancy with history of pre-term labor, second trimester: Secondary | ICD-10-CM | POA: Diagnosis not present

## 2019-02-23 NOTE — Telephone Encounter (Signed)
I called Ms. Kimberly Delgado to discuss her negative noninvasive prenatal screening (NIPS)/cell free DNA (cfDNA) testing result. Specifically, Ms. Kimberly Delgado had MaterniT21 testing through [place] was offered because of quad results that were high risk for trisomy 21 AKA Down syndrome. These negative NIPS results demonstrated an expected representation of chromosome 21, 18, and 13 material, greatly reducing the likelihood of trisomies 21, 13, or 18 for the pregnancy. The predicted fetal sex was consistent with what was seen on ultrasound, which is female.  NIPS analyzes placental (fetal) DNA in maternal circulation. NIPS is considered to be highly specific and sensitive, but is not considered to be a diagnostic test. We reviewed that this testing identifies the majority of pregnancies with trisomies 31, 80, and 34, as well as the sex of the baby. Ms. Kimberly Delgado was reminded that diagnostic testing via amniocentesis is available should she be interested in confirming this result.   Ms. Kimberly Delgado inquired about her carrier screening results. We discussed that results from cystic fibrosis (CF) and spinal muscular atrophy (SMA) carrier screening are still pending, but that hemoglobin electrophoresis assessing for hemoglobinopathies was normal. I will call her when results from CF/SMA carrier screening become available, likely within the next week or two. Ms. Kimberly Delgado was very relieved to hear about her negative NIPS result and confirmed that she had no further questions at this time.  Buelah Manis, MS Genetic Counselor

## 2019-02-27 LAB — HGB FRAC. W/SOLUBILITY
Hgb A2 Quant: 2.4 % (ref 1.8–3.2)
Hgb A: 97.6 % (ref 96.4–98.8)
Hgb C: 0 %
Hgb F Quant: 0 % (ref 0.0–2.0)
Hgb S: 0 %
Hgb Solubility: NEGATIVE
Hgb Variant: 0 %

## 2019-02-27 LAB — INHERITEST(R) CF/SMA PANEL

## 2019-02-27 LAB — MATERNIT 21 PLUS CORE, BLOOD
Fetal Fraction: 12
Result (T21): NEGATIVE
Trisomy 13 (Patau syndrome): NEGATIVE
Trisomy 18 (Edwards syndrome): NEGATIVE
Trisomy 21 (Down syndrome): NEGATIVE

## 2019-03-01 ENCOUNTER — Telehealth (HOSPITAL_COMMUNITY): Payer: Self-pay | Admitting: Genetic Counselor

## 2019-03-01 NOTE — Telephone Encounter (Signed)
I called Ms. Kimberly Delgado to discuss her negative carrier screening for cystic fibrosis (CF), spinal muscular atrophy (SMA), and hemoglobinopathies. Ms. Kimberly Delgado was negative for the mutations analyzed in the CFTR gene associated with cystic fibrosis. Based on this negative result, she has a 1 in 343 residual risk of being a carrier for cystic fibrosis. Ms. 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 *3+80T>G SNP. Based on this negative result, she has a 1 in 921 residual risk of being a carrier for SMA. Ms. Kimberly Delgado also had a normal hemoglobin electrophoresis, significantly reducing the risk of her being a carrier for hemoglobinopathies such as sickle cell disease. All of Ms. Kimberly Delgado's negative carrier screening results significantly decrease the chance of her pregnancies being affected by one of these conditions. Ms. Trejo confirmed that she had no further questions at this time.  Buelah Manis, MS Genetic Counselor

## 2019-03-03 DIAGNOSIS — Z3A21 21 weeks gestation of pregnancy: Secondary | ICD-10-CM | POA: Diagnosis not present

## 2019-03-03 DIAGNOSIS — O09212 Supervision of pregnancy with history of pre-term labor, second trimester: Secondary | ICD-10-CM | POA: Diagnosis not present

## 2019-03-08 MED FILL — busPIRone HCL 5 MG TABS: 5 | 30 days supply | Qty: 60 | Fill #3

## 2019-03-11 DIAGNOSIS — Z3A22 22 weeks gestation of pregnancy: Secondary | ICD-10-CM | POA: Diagnosis not present

## 2019-03-11 DIAGNOSIS — O09212 Supervision of pregnancy with history of pre-term labor, second trimester: Secondary | ICD-10-CM | POA: Diagnosis not present

## 2019-03-18 DIAGNOSIS — Z23 Encounter for immunization: Secondary | ICD-10-CM | POA: Diagnosis not present

## 2019-03-18 DIAGNOSIS — O09212 Supervision of pregnancy with history of pre-term labor, second trimester: Secondary | ICD-10-CM | POA: Diagnosis not present

## 2019-03-18 DIAGNOSIS — Z3A23 23 weeks gestation of pregnancy: Secondary | ICD-10-CM | POA: Diagnosis not present

## 2019-03-18 DIAGNOSIS — O3432 Maternal care for cervical incompetence, second trimester: Secondary | ICD-10-CM | POA: Diagnosis not present

## 2019-03-18 DIAGNOSIS — O479 False labor, unspecified: Secondary | ICD-10-CM | POA: Diagnosis not present

## 2019-03-18 MED FILL — INDOMETHACIN 25 MG CAPSULE: 25 | 2 days supply | Qty: 8 | Fill #0

## 2019-03-25 DIAGNOSIS — O09212 Supervision of pregnancy with history of pre-term labor, second trimester: Secondary | ICD-10-CM | POA: Diagnosis not present

## 2019-03-26 DIAGNOSIS — O09212 Supervision of pregnancy with history of pre-term labor, second trimester: Secondary | ICD-10-CM | POA: Diagnosis not present

## 2019-03-26 DIAGNOSIS — Z3A24 24 weeks gestation of pregnancy: Secondary | ICD-10-CM | POA: Diagnosis not present

## 2019-04-01 DIAGNOSIS — O09212 Supervision of pregnancy with history of pre-term labor, second trimester: Secondary | ICD-10-CM | POA: Diagnosis not present

## 2019-04-05 MED FILL — busPIRone HCL 5 MG TABS: 5 | 30 days supply | Qty: 60 | Fill #4

## 2019-04-07 DIAGNOSIS — Z3A26 26 weeks gestation of pregnancy: Secondary | ICD-10-CM | POA: Diagnosis not present

## 2019-04-07 DIAGNOSIS — O3432 Maternal care for cervical incompetence, second trimester: Secondary | ICD-10-CM | POA: Diagnosis not present

## 2019-04-07 DIAGNOSIS — O09212 Supervision of pregnancy with history of pre-term labor, second trimester: Secondary | ICD-10-CM | POA: Diagnosis not present

## 2019-04-14 DIAGNOSIS — O3432 Maternal care for cervical incompetence, second trimester: Secondary | ICD-10-CM | POA: Diagnosis not present

## 2019-04-14 DIAGNOSIS — Z3A27 27 weeks gestation of pregnancy: Secondary | ICD-10-CM | POA: Diagnosis not present

## 2019-04-14 DIAGNOSIS — O09212 Supervision of pregnancy with history of pre-term labor, second trimester: Secondary | ICD-10-CM | POA: Diagnosis not present

## 2019-04-19 DIAGNOSIS — O09212 Supervision of pregnancy with history of pre-term labor, second trimester: Secondary | ICD-10-CM | POA: Diagnosis not present

## 2019-04-21 DIAGNOSIS — Z3A28 28 weeks gestation of pregnancy: Secondary | ICD-10-CM | POA: Diagnosis not present

## 2019-04-21 DIAGNOSIS — Z3689 Encounter for other specified antenatal screening: Secondary | ICD-10-CM | POA: Diagnosis not present

## 2019-04-21 DIAGNOSIS — O360130 Maternal care for anti-D [Rh] antibodies, third trimester, not applicable or unspecified: Secondary | ICD-10-CM | POA: Diagnosis not present

## 2019-04-21 DIAGNOSIS — O09213 Supervision of pregnancy with history of pre-term labor, third trimester: Secondary | ICD-10-CM | POA: Diagnosis not present

## 2019-04-29 DIAGNOSIS — O99810 Abnormal glucose complicating pregnancy: Secondary | ICD-10-CM | POA: Diagnosis not present

## 2019-04-29 DIAGNOSIS — Z23 Encounter for immunization: Secondary | ICD-10-CM | POA: Diagnosis not present

## 2019-04-29 DIAGNOSIS — Z3A29 29 weeks gestation of pregnancy: Secondary | ICD-10-CM | POA: Diagnosis not present

## 2019-04-29 DIAGNOSIS — O09213 Supervision of pregnancy with history of pre-term labor, third trimester: Secondary | ICD-10-CM | POA: Diagnosis not present

## 2019-04-29 DIAGNOSIS — O3433 Maternal care for cervical incompetence, third trimester: Secondary | ICD-10-CM | POA: Diagnosis not present

## 2019-05-05 DIAGNOSIS — Z3A30 30 weeks gestation of pregnancy: Secondary | ICD-10-CM | POA: Diagnosis not present

## 2019-05-05 DIAGNOSIS — O09213 Supervision of pregnancy with history of pre-term labor, third trimester: Secondary | ICD-10-CM | POA: Diagnosis not present

## 2019-05-06 DIAGNOSIS — O09213 Supervision of pregnancy with history of pre-term labor, third trimester: Secondary | ICD-10-CM | POA: Diagnosis not present

## 2019-05-06 DIAGNOSIS — Z3A30 30 weeks gestation of pregnancy: Secondary | ICD-10-CM | POA: Diagnosis not present

## 2019-05-12 DIAGNOSIS — O09213 Supervision of pregnancy with history of pre-term labor, third trimester: Secondary | ICD-10-CM | POA: Diagnosis not present

## 2019-05-12 DIAGNOSIS — Z3A31 31 weeks gestation of pregnancy: Secondary | ICD-10-CM | POA: Diagnosis not present

## 2019-05-13 DIAGNOSIS — O09212 Supervision of pregnancy with history of pre-term labor, second trimester: Secondary | ICD-10-CM | POA: Diagnosis not present

## 2019-05-19 DIAGNOSIS — Z3A32 32 weeks gestation of pregnancy: Secondary | ICD-10-CM | POA: Diagnosis not present

## 2019-05-19 DIAGNOSIS — O09213 Supervision of pregnancy with history of pre-term labor, third trimester: Secondary | ICD-10-CM | POA: Diagnosis not present

## 2019-05-26 DIAGNOSIS — Z3A33 33 weeks gestation of pregnancy: Secondary | ICD-10-CM | POA: Diagnosis not present

## 2019-05-26 DIAGNOSIS — O09213 Supervision of pregnancy with history of pre-term labor, third trimester: Secondary | ICD-10-CM | POA: Diagnosis not present

## 2019-05-29 MED FILL — busPIRone HCL 5 MG TABS: 5 | 30 days supply | Qty: 60 | Fill #5

## 2019-06-02 DIAGNOSIS — O09213 Supervision of pregnancy with history of pre-term labor, third trimester: Secondary | ICD-10-CM | POA: Diagnosis not present

## 2019-06-02 DIAGNOSIS — Z3A34 34 weeks gestation of pregnancy: Secondary | ICD-10-CM | POA: Diagnosis not present

## 2019-06-09 DIAGNOSIS — O09213 Supervision of pregnancy with history of pre-term labor, third trimester: Secondary | ICD-10-CM | POA: Diagnosis not present

## 2019-06-09 DIAGNOSIS — Z3A35 35 weeks gestation of pregnancy: Secondary | ICD-10-CM | POA: Diagnosis not present

## 2019-06-09 DIAGNOSIS — Z3685 Encounter for antenatal screening for Streptococcus B: Secondary | ICD-10-CM | POA: Diagnosis not present

## 2019-06-09 DIAGNOSIS — O3433 Maternal care for cervical incompetence, third trimester: Secondary | ICD-10-CM | POA: Diagnosis not present

## 2019-06-09 LAB — OB RESULTS CONSOLE GBS: GBS: NEGATIVE

## 2019-06-15 DIAGNOSIS — Z3A35 35 weeks gestation of pregnancy: Secondary | ICD-10-CM | POA: Diagnosis not present

## 2019-06-15 DIAGNOSIS — O09213 Supervision of pregnancy with history of pre-term labor, third trimester: Secondary | ICD-10-CM | POA: Diagnosis not present

## 2019-06-24 ENCOUNTER — Other Ambulatory Visit: Payer: Self-pay | Admitting: Obstetrics and Gynecology

## 2019-06-25 ENCOUNTER — Encounter (HOSPITAL_COMMUNITY): Payer: Self-pay

## 2019-06-25 ENCOUNTER — Telehealth (HOSPITAL_COMMUNITY): Payer: Self-pay | Admitting: *Deleted

## 2019-06-25 NOTE — Patient Instructions (Signed)
Kimberly Delgado  06/25/2019   Your procedure is scheduled on:  07/09/2019  Arrive at [place] at Entrance C on [address] at [facility]  and [address]. You are invited to use the FREE valet parking or use the Visitor's parking deck.  Pick up the phone at the desk and dial [phone].  Call this number if you have problems the morning of surgery: [phone]  Remember:   Do not eat food:(After Midnight) Desps de medianoche.  Do not drink clear liquids: (After Midnight) Desps de medianoche.  Take these medicines the morning of surgery with A SIP OF WATER:  May take buspar   Do not wear jewelry, make-up or nail polish.  Do not wear lotions, powders, or perfumes. Do not wear deodorant.  Do not shave 48 hours prior to surgery.  Do not bring valuables to the hospital.  [facility] is not   responsible for any belongings or valuables brought to the hospital.  Contacts, dentures or bridgework may not be worn into surgery.  Leave suitcase in the car. After surgery it may be brought to your room.  For patients admitted to the hospital, checkout time is 11:00 AM the day of              discharge.      Please read over the following fact sheets that you were given:     Preparing for Surgery

## 2019-06-25 NOTE — Telephone Encounter (Signed)
Preadmission screen  

## 2019-06-28 ENCOUNTER — Encounter (HOSPITAL_COMMUNITY): Payer: Self-pay

## 2019-07-07 ENCOUNTER — Other Ambulatory Visit: Payer: Self-pay

## 2019-07-07 ENCOUNTER — Other Ambulatory Visit (HOSPITAL_COMMUNITY)
Admission: RE | Admit: 2019-07-07 | Discharge: 2019-07-07 | Disposition: A | Discharge status: Home / Self Care | Payer: 59 | Source: Ambulatory Visit | Attending: Obstetrics and Gynecology | Admitting: Obstetrics and Gynecology

## 2019-07-07 DIAGNOSIS — Z20822 Contact with and (suspected) exposure to covid-19: Secondary | ICD-10-CM | POA: Insufficient documentation

## 2019-07-07 DIAGNOSIS — Z6791 Unspecified blood type, Rh negative: Secondary | ICD-10-CM | POA: Diagnosis not present

## 2019-07-07 DIAGNOSIS — Z302 Encounter for sterilization: Secondary | ICD-10-CM | POA: Diagnosis not present

## 2019-07-07 DIAGNOSIS — O321XX0 Maternal care for breech presentation, not applicable or unspecified: Secondary | ICD-10-CM | POA: Diagnosis not present

## 2019-07-07 DIAGNOSIS — O3430 Maternal care for cervical incompetence, unspecified trimester: Secondary | ICD-10-CM | POA: Insufficient documentation

## 2019-07-07 DIAGNOSIS — Z01812 Encounter for preprocedural laboratory examination: Secondary | ICD-10-CM | POA: Insufficient documentation

## 2019-07-07 DIAGNOSIS — O26893 Other specified pregnancy related conditions, third trimester: Secondary | ICD-10-CM | POA: Diagnosis not present

## 2019-07-07 DIAGNOSIS — O3433 Maternal care for cervical incompetence, third trimester: Secondary | ICD-10-CM | POA: Diagnosis not present

## 2019-07-07 DIAGNOSIS — Z3A39 39 weeks gestation of pregnancy: Secondary | ICD-10-CM | POA: Diagnosis not present

## 2019-07-07 HISTORY — DX: Supervision of elderly multigravida, unspecified trimester: O09.529

## 2019-07-07 HISTORY — DX: Polycystic ovarian syndrome: E28.2

## 2019-07-07 LAB — CBC
HCT: 35.4 % — ABNORMAL LOW (ref 36.0–46.0)
Hemoglobin: 11.5 g/dL — ABNORMAL LOW (ref 12.0–15.0)
MCH: 28.8 pg (ref 26.0–34.0)
MCHC: 32.5 g/dL (ref 30.0–36.0)
MCV: 88.5 fL (ref 80.0–100.0)
Platelets: 221 10*3/uL (ref 150–400)
RBC: 4 MIL/uL (ref 3.87–5.11)
RDW: 13.7 % (ref 11.5–15.5)
WBC: 10.8 10*3/uL — ABNORMAL HIGH (ref 4.0–10.5)
nRBC: 0 % (ref 0.0–0.2)

## 2019-07-07 LAB — SARS CORONAVIRUS 2 (TAT 6-24 HRS): SARS Coronavirus 2: NEGATIVE

## 2019-07-07 NOTE — MAU Note (Signed)
Pt reports no symptoms and tolerated swab well.

## 2019-07-08 LAB — RPR: RPR Ser Ql: NONREACTIVE

## 2019-07-09 ENCOUNTER — Other Ambulatory Visit: Payer: Self-pay

## 2019-07-09 ENCOUNTER — Inpatient Hospital Stay (HOSPITAL_COMMUNITY): Payer: 59 | Admitting: Anesthesiology

## 2019-07-09 ENCOUNTER — Encounter (HOSPITAL_COMMUNITY): Payer: Self-pay | Admitting: Obstetrics and Gynecology

## 2019-07-09 ENCOUNTER — Encounter (HOSPITAL_COMMUNITY)
Admission: RE | Disposition: A | Discharge status: Home / Self Care | Payer: Self-pay | Source: Home / Self Care | Attending: Obstetrics and Gynecology

## 2019-07-09 ENCOUNTER — Inpatient Hospital Stay (HOSPITAL_COMMUNITY)
Admission: RE | Admit: 2019-07-09 | Discharge: 2019-07-11 | DRG: 819 | Disposition: A | Discharge status: Home / Self Care | Payer: 59 | Attending: Obstetrics and Gynecology | Admitting: Obstetrics and Gynecology

## 2019-07-09 DIAGNOSIS — O321XX0 Maternal care for breech presentation, not applicable or unspecified: Secondary | ICD-10-CM | POA: Diagnosis present

## 2019-07-09 DIAGNOSIS — Z6791 Unspecified blood type, Rh negative: Secondary | ICD-10-CM | POA: Diagnosis not present

## 2019-07-09 DIAGNOSIS — Z3A39 39 weeks gestation of pregnancy: Secondary | ICD-10-CM | POA: Diagnosis not present

## 2019-07-09 DIAGNOSIS — O3433 Maternal care for cervical incompetence, third trimester: Secondary | ICD-10-CM | POA: Diagnosis not present

## 2019-07-09 DIAGNOSIS — O26893 Other specified pregnancy related conditions, third trimester: Secondary | ICD-10-CM | POA: Diagnosis present

## 2019-07-09 DIAGNOSIS — Z302 Encounter for sterilization: Secondary | ICD-10-CM

## 2019-07-09 DIAGNOSIS — Z20822 Contact with and (suspected) exposure to covid-19: Secondary | ICD-10-CM | POA: Diagnosis present

## 2019-07-09 DIAGNOSIS — Z3A00 Weeks of gestation of pregnancy not specified: Secondary | ICD-10-CM | POA: Diagnosis not present

## 2019-07-09 SURGERY — Surgical Case
Anesthesia: Spinal | Laterality: Bilateral | Wound class: Clean Contaminated

## 2019-07-09 MED ORDER — DIPHENHYDRAMINE HCL 25 MG PO CAPS: 25.0000 mg | ORAL_CAPSULE | Freq: Four times a day (QID) | ORAL | Status: DC | PRN

## 2019-07-09 MED ORDER — NALBUPHINE HCL 10 MG/ML IJ SOLN: 5.0000 mg | Freq: Once | INTRAMUSCULAR | Status: DC | PRN

## 2019-07-09 MED ORDER — SCOPOLAMINE 1 MG/3DAYS TD PT72
1.0000 | MEDICATED_PATCH | Freq: Once | TRANSDERMAL | Status: DC
  Administered 2019-07-09: 09:00:00 1.5 mg via TRANSDERMAL

## 2019-07-09 MED ORDER — BUPIVACAINE IN DEXTROSE 0.75-8.25 % IT SOLN
INTRATHECAL | Status: DC | PRN
  Administered 2019-07-09: 1.8 mL via INTRATHECAL

## 2019-07-09 MED ORDER — SIMETHICONE 80 MG PO CHEW
80.0000 mg | CHEWABLE_TABLET | ORAL | Status: DC
  Administered 2019-07-10 (×2): 80 mg via ORAL
  Filled 2019-07-09 (×2): qty 1

## 2019-07-09 MED ORDER — OXYTOCIN 40 UNITS IN NORMAL SALINE INFUSION - SIMPLE MED: INTRAVENOUS | Status: DC | PRN

## 2019-07-09 MED ORDER — LACTATED RINGERS IV SOLN: INTRAVENOUS | Status: DC

## 2019-07-09 MED ORDER — BUPIVACAINE HCL (PF) 0.25 % IJ SOLN
INTRAMUSCULAR | Status: AC
  Filled 2019-07-09: qty 10

## 2019-07-09 MED ORDER — ONDANSETRON HCL 4 MG/2ML IJ SOLN
INTRAMUSCULAR | Status: AC
  Filled 2019-07-09: qty 2

## 2019-07-09 MED ORDER — LACTATED RINGERS IV SOLN: INTRAVENOUS | Status: DC | PRN

## 2019-07-09 MED ORDER — ACETAMINOPHEN 500 MG PO TABS
1000.0000 mg | ORAL_TABLET | Freq: Four times a day (QID) | ORAL | Status: AC
  Administered 2019-07-09 – 2019-07-10 (×3): 1000 mg via ORAL
  Filled 2019-07-09 (×3): qty 2

## 2019-07-09 MED ORDER — FENTANYL CITRATE (PF) 100 MCG/2ML IJ SOLN
INTRAMUSCULAR | Status: DC | PRN
  Administered 2019-07-09: 15 ug via INTRATHECAL

## 2019-07-09 MED ORDER — OXYTOCIN 40 UNITS IN NORMAL SALINE INFUSION - SIMPLE MED
INTRAVENOUS | Status: DC | PRN
  Administered 2019-07-09: 40 [IU] via INTRAVENOUS

## 2019-07-09 MED ORDER — KETOROLAC TROMETHAMINE 30 MG/ML IJ SOLN
INTRAMUSCULAR | Status: AC
  Filled 2019-07-09: qty 1

## 2019-07-09 MED ORDER — IBUPROFEN 800 MG PO TABS
800.0000 mg | ORAL_TABLET | Freq: Four times a day (QID) | ORAL | Status: DC
  Administered 2019-07-10 – 2019-07-11 (×5): 800 mg via ORAL
  Filled 2019-07-09 (×5): qty 1

## 2019-07-09 MED ORDER — CEFAZOLIN SODIUM-DEXTROSE 2-4 GM/100ML-% IV SOLN
INTRAVENOUS | Status: AC
  Filled 2019-07-09: qty 100

## 2019-07-09 MED ORDER — NALBUPHINE HCL 10 MG/ML IJ SOLN: 5.0000 mg | INTRAMUSCULAR | Status: DC | PRN

## 2019-07-09 MED ORDER — KETOROLAC TROMETHAMINE 30 MG/ML IJ SOLN
30.0000 mg | Freq: Four times a day (QID) | INTRAMUSCULAR | Status: AC
  Administered 2019-07-09 – 2019-07-10 (×4): 30 mg via INTRAVENOUS
  Filled 2019-07-09 (×4): qty 1

## 2019-07-09 MED ORDER — MORPHINE SULFATE (PF) 0.5 MG/ML IJ SOLN
INTRAMUSCULAR | Status: AC
  Filled 2019-07-09: qty 10

## 2019-07-09 MED ORDER — OXYCODONE HCL 5 MG PO TABS
5.0000 mg | ORAL_TABLET | ORAL | Status: DC | PRN
  Administered 2019-07-10 – 2019-07-11 (×4): 5 mg via ORAL
  Filled 2019-07-09 (×4): qty 1

## 2019-07-09 MED ORDER — NALOXONE HCL 4 MG/10ML IJ SOLN
1.0000 ug/kg/h | INTRAVENOUS | Status: DC | PRN
  Filled 2019-07-09: qty 5

## 2019-07-09 MED ORDER — CEFAZOLIN SODIUM-DEXTROSE 2-3 GM-%(50ML) IV SOLR
INTRAVENOUS | Status: DC | PRN
  Administered 2019-07-09: 2 g via INTRAVENOUS

## 2019-07-09 MED ORDER — WITCH HAZEL-GLYCERIN EX PADS: 1.0000 "application " | MEDICATED_PAD | CUTANEOUS | Status: DC | PRN

## 2019-07-09 MED ORDER — FENTANYL CITRATE (PF) 100 MCG/2ML IJ SOLN: 25.0000 ug | INTRAMUSCULAR | Status: DC | PRN

## 2019-07-09 MED ORDER — KETOROLAC TROMETHAMINE 30 MG/ML IJ SOLN
30.0000 mg | Freq: Four times a day (QID) | INTRAMUSCULAR | Status: AC | PRN
  Administered 2019-07-09: 10:00:00 30 mg via INTRAMUSCULAR

## 2019-07-09 MED ORDER — SIMETHICONE 80 MG PO CHEW: 80.0000 mg | CHEWABLE_TABLET | ORAL | Status: DC | PRN

## 2019-07-09 MED ORDER — SCOPOLAMINE 1 MG/3DAYS TD PT72
MEDICATED_PATCH | TRANSDERMAL | Status: AC
  Filled 2019-07-09: qty 1

## 2019-07-09 MED ORDER — ONDANSETRON HCL 4 MG/2ML IJ SOLN
INTRAMUSCULAR | Status: DC | PRN
  Administered 2019-07-09: 4 mg via INTRAVENOUS

## 2019-07-09 MED ORDER — MEPERIDINE HCL 25 MG/ML IJ SOLN: 6.2500 mg | INTRAMUSCULAR | Status: DC | PRN

## 2019-07-09 MED ORDER — FENTANYL CITRATE (PF) 100 MCG/2ML IJ SOLN
INTRAMUSCULAR | Status: AC
  Filled 2019-07-09: qty 2

## 2019-07-09 MED ORDER — BUSPIRONE HCL 5 MG PO TABS
5.0000 mg | ORAL_TABLET | Freq: Every day | ORAL | Status: DC
  Administered 2019-07-10 – 2019-07-11 (×2): 5 mg via ORAL
  Filled 2019-07-09 (×2): qty 1

## 2019-07-09 MED ORDER — COCONUT OIL OIL: 1.0000 "application " | TOPICAL_OIL | Status: DC | PRN

## 2019-07-09 MED ORDER — CEFAZOLIN SODIUM-DEXTROSE 2-4 GM/100ML-% IV SOLN: 2.0000 g | INTRAVENOUS | Status: DC

## 2019-07-09 MED ORDER — PHENYLEPHRINE HCL-NACL 20-0.9 MG/250ML-% IV SOLN
INTRAVENOUS | Status: AC
  Filled 2019-07-09: qty 250

## 2019-07-09 MED ORDER — ACETAMINOPHEN 10 MG/ML IV SOLN
1000.0000 mg | Freq: Once | INTRAVENOUS | Status: DC | PRN
  Administered 2019-07-09: 09:00:00 1000 mg via INTRAVENOUS

## 2019-07-09 MED ORDER — KETOROLAC TROMETHAMINE 30 MG/ML IJ SOLN: 30.0000 mg | Freq: Four times a day (QID) | INTRAMUSCULAR | Status: AC | PRN

## 2019-07-09 MED ORDER — DIPHENHYDRAMINE HCL 50 MG/ML IJ SOLN: 12.5000 mg | INTRAMUSCULAR | Status: DC | PRN

## 2019-07-09 MED ORDER — BUPIVACAINE HCL (PF) 0.25 % IJ SOLN
INTRAMUSCULAR | Status: AC
  Filled 2019-07-09: qty 20

## 2019-07-09 MED ORDER — STERILE WATER FOR IRRIGATION IR SOLN
Status: DC | PRN
  Administered 2019-07-09: 1

## 2019-07-09 MED ORDER — MENTHOL 3 MG MT LOZG: 1.0000 | LOZENGE | OROMUCOSAL | Status: DC | PRN

## 2019-07-09 MED ORDER — KETOROLAC TROMETHAMINE 30 MG/ML IJ SOLN: 30.0000 mg | Freq: Once | INTRAMUSCULAR | Status: DC | PRN

## 2019-07-09 MED ORDER — BUPIVACAINE HCL (PF) 0.25 % IJ SOLN
INTRAMUSCULAR | Status: DC | PRN
  Administered 2019-07-09: 10 mL

## 2019-07-09 MED ORDER — SODIUM CHLORIDE 0.9 % IR SOLN
Status: DC | PRN
  Administered 2019-07-09: 1

## 2019-07-09 MED ORDER — SODIUM CHLORIDE 0.9% FLUSH: 3.0000 mL | INTRAVENOUS | Status: DC | PRN

## 2019-07-09 MED ORDER — PHENYLEPHRINE HCL-NACL 20-0.9 MG/250ML-% IV SOLN
INTRAVENOUS | Status: DC | PRN
  Administered 2019-07-09: 60 ug/min via INTRAVENOUS

## 2019-07-09 MED ORDER — ONDANSETRON HCL 4 MG/2ML IJ SOLN: 4.0000 mg | Freq: Three times a day (TID) | INTRAMUSCULAR | Status: DC | PRN

## 2019-07-09 MED ORDER — SIMETHICONE 80 MG PO CHEW
80.0000 mg | CHEWABLE_TABLET | Freq: Three times a day (TID) | ORAL | Status: DC
  Administered 2019-07-09 – 2019-07-11 (×6): 80 mg via ORAL
  Filled 2019-07-09 (×7): qty 1

## 2019-07-09 MED ORDER — NALOXONE HCL 0.4 MG/ML IJ SOLN: 0.4000 mg | INTRAMUSCULAR | Status: DC | PRN

## 2019-07-09 MED ORDER — MORPHINE SULFATE (PF) 0.5 MG/ML IJ SOLN
INTRAMUSCULAR | Status: DC | PRN
  Administered 2019-07-09: .15 mg via INTRATHECAL

## 2019-07-09 MED ORDER — ACETAMINOPHEN 10 MG/ML IV SOLN
INTRAVENOUS | Status: AC
  Filled 2019-07-09: qty 100

## 2019-07-09 MED ORDER — SENNOSIDES-DOCUSATE SODIUM 8.6-50 MG PO TABS
2.0000 | ORAL_TABLET | ORAL | Status: DC
  Administered 2019-07-10 (×2): 2 via ORAL
  Filled 2019-07-09 (×2): qty 2

## 2019-07-09 MED ORDER — DIPHENHYDRAMINE HCL 25 MG PO CAPS: 25.0000 mg | ORAL_CAPSULE | ORAL | Status: DC | PRN

## 2019-07-09 MED ORDER — OXYTOCIN 40 UNITS IN NORMAL SALINE INFUSION - SIMPLE MED
INTRAVENOUS | Status: AC
  Filled 2019-07-09: qty 1000

## 2019-07-09 MED ORDER — ZOLPIDEM TARTRATE 5 MG PO TABS: 5.0000 mg | ORAL_TABLET | Freq: Every evening | ORAL | Status: DC | PRN

## 2019-07-09 MED ORDER — DIBUCAINE (PERIANAL) 1 % EX OINT: 1.0000 "application " | TOPICAL_OINTMENT | CUTANEOUS | Status: DC | PRN

## 2019-07-09 MED ORDER — OXYTOCIN 40 UNITS IN NORMAL SALINE INFUSION - SIMPLE MED: 2.5000 [IU]/h | INTRAVENOUS | Status: AC

## 2019-07-09 MED ORDER — PRENATAL MULTIVITAMIN CH
1.0000 | ORAL_TABLET | Freq: Every day | ORAL | Status: DC
  Administered 2019-07-10 – 2019-07-11 (×2): 1 via ORAL
  Filled 2019-07-09 (×2): qty 1

## 2019-07-09 SURGICAL SUPPLY — 46 items
BARRIER ADHS 3X4 INTERCEED (GAUZE/BANDAGES/DRESSINGS) ×3 IMPLANT
BENZOIN TINCTURE PRP APPL 2/3 (GAUZE/BANDAGES/DRESSINGS) ×3 IMPLANT
CLAMP CORD UMBIL (MISCELLANEOUS) IMPLANT
CLOSURE STERI STRIP 1/2 X4 (GAUZE/BANDAGES/DRESSINGS) ×3 IMPLANT
CLOSURE WOUND 1/2 X4 (GAUZE/BANDAGES/DRESSINGS)
CLOTH BEACON ORANGE TIMEOUT ST (SAFETY) ×3 IMPLANT
DRAPE C SECTION CLR SCREEN (DRAPES) ×3 IMPLANT
DRSG OPSITE POSTOP 4X10 (GAUZE/BANDAGES/DRESSINGS) ×3 IMPLANT
ELECT REM PT RETURN 9FT ADLT (ELECTROSURGICAL) ×3
ELECTRODE REM PT RTRN 9FT ADLT (ELECTROSURGICAL) ×1 IMPLANT
EXTRACTOR VACUUM M CUP 4 TUBE (SUCTIONS) IMPLANT
EXTRACTOR VACUUM M CUP 4' TUBE (SUCTIONS)
GLOVE BIOGEL PI IND STRL 7.0 (GLOVE) ×2 IMPLANT
GLOVE BIOGEL PI INDICATOR 7.0 (GLOVE) ×4
GLOVE ECLIPSE 6.5 STRL STRAW (GLOVE) ×3 IMPLANT
GOWN STRL REUS W/TWL LRG LVL3 (GOWN DISPOSABLE) ×6 IMPLANT
KIT ABG SYR 3ML LUER SLIP (SYRINGE) IMPLANT
NEEDLE HYPO 22GX1.5 SAFETY (NEEDLE) ×3 IMPLANT
NEEDLE HYPO 25X5/8 SAFETYGLIDE (NEEDLE) IMPLANT
NS IRRIG 1000ML POUR BTL (IV SOLUTION) ×3 IMPLANT
PACK C SECTION WH (CUSTOM PROCEDURE TRAY) ×3 IMPLANT
PAD OB MATERNITY 4.3X12.25 (PERSONAL CARE ITEMS) ×3 IMPLANT
RETRACTOR WND ALEXIS 25 LRG (MISCELLANEOUS) ×1 IMPLANT
RTRCTR C-SECT PINK 25CM LRG (MISCELLANEOUS) IMPLANT
RTRCTR WOUND ALEXIS 25CM LRG (MISCELLANEOUS) ×3
STRIP CLOSURE SKIN 1/2X4 (GAUZE/BANDAGES/DRESSINGS) IMPLANT
SUT CHROMIC GUT AB #0 18 (SUTURE) ×3 IMPLANT
SUT MNCRL 0 VIOLET CTX 36 (SUTURE) ×3 IMPLANT
SUT MON AB 2-0 SH 27 (SUTURE)
SUT MON AB 2-0 SH27 (SUTURE) IMPLANT
SUT MON AB 3-0 SH 27 (SUTURE)
SUT MON AB 3-0 SH27 (SUTURE) IMPLANT
SUT MON AB 4-0 PS1 27 (SUTURE) IMPLANT
SUT MONOCRYL 0 CTX 36 (SUTURE) ×6
SUT PLAIN 2 0 (SUTURE)
SUT PLAIN 2 0 XLH (SUTURE) IMPLANT
SUT PLAIN ABS 2-0 CT1 27XMFL (SUTURE) IMPLANT
SUT PROLENE 1 CT (SUTURE) ×3 IMPLANT
SUT VIC AB 0 CT1 36 (SUTURE) ×6 IMPLANT
SUT VIC AB 2-0 CT1 27 (SUTURE) ×2
SUT VIC AB 2-0 CT1 TAPERPNT 27 (SUTURE) ×1 IMPLANT
SUT VIC AB 4-0 PS2 27 (SUTURE) IMPLANT
SYR CONTROL 10ML LL (SYRINGE) ×3 IMPLANT
TOWEL OR 17X24 6PK STRL BLUE (TOWEL DISPOSABLE) ×3 IMPLANT
TRAY FOLEY W/BAG SLVR 14FR LF (SET/KITS/TRAYS/PACK) IMPLANT
WATER STERILE IRR 1000ML POUR (IV SOLUTION) ×3 IMPLANT

## 2019-07-09 NOTE — Transfer of Care (Signed)
Immediate Anesthesia Transfer of Care Note  Patient: Kimberly Delgado  Procedure(s) Performed: Primary CESAREAN SECTION WITH BILATERAL TUBAL LIGATION (Bilateral )  Patient Location: PACU  Anesthesia Type:Spinal  Level of Consciousness: awake and alert   Airway & Oxygen Therapy: Patient Spontanous Breathing  Post-op Assessment: Report given to RN and Post -op Vital signs reviewed and stable  Post vital signs: Reviewed  Last Vitals:  Vitals Value Taken Time  BP 104/70 07/09/19 0904  Temp    Pulse 81 07/09/19 0908  Resp 18 07/09/19 0908  SpO2 100 % 07/09/19 0908  Vitals shown include unvalidated device data.  Last Pain:  Vitals:   07/09/19 0601  TempSrc: Oral         Complications: No apparent anesthesia complications

## 2019-07-09 NOTE — Anesthesia Procedure Notes (Signed)
Spinal  Patient location during procedure: OR Start time: 07/09/2019 7:39 AM End time: 07/09/2019 7:49 AM Staffing Performed: anesthesiologist  Anesthesiologist: Freddrick March, MD Preanesthetic Checklist Completed: patient identified, IV checked, risks and benefits discussed, surgical consent, monitors and equipment checked, pre-op evaluation and timeout performed Spinal Block Patient position: sitting Prep: DuraPrep and site prepped and draped Patient monitoring: cardiac monitor, continuous pulse ox and blood pressure Approach: midline Location: L3-4 Injection technique: single-shot Needle Needle type: Pencan  Needle gauge: 24 G Needle length: 9 cm Assessment Sensory level: T6 Additional Notes Functioning IV was confirmed and monitors were applied. Sterile prep and drape, including hand hygiene and sterile gloves were used. The patient was positioned and the spine was prepped. The skin was anesthetized with lidocaine.  Free flow of clear CSF was obtained prior to injecting local anesthetic into the CSF.  The spinal needle aspirated freely following injection.  The needle was carefully withdrawn.  The patient tolerated the procedure well.

## 2019-07-09 NOTE — Brief Op Note (Signed)
07/09/2019  9:21 AM  PATIENT:  Kimberly Delgado  34 y.o. female  PRE-OPERATIVE DIAGNOSIS:  Abdominal Cerclage, Term Gestation, Desires Sterilization  POST-OPERATIVE DIAGNOSIS:  Abdominal cerclage, term gestation, desires sterilization, incomplete breech presentation   PROCEDURE:  Primary Cesarean section, Buddy Duty hysterotomy, modified P[place]oy TL  SURGEON:  Surgeon(s) and Role:    * Servando Salina, MD - Primary  PHYSICIAN ASSISTANT:   ASSISTANTS: Maida Sale RNFA   ANESTHESIA:   spinal Findings: live female incomplete breech, nl tubes and ovaries EBL:  591 ml  BLOOD ADMINISTERED:none  DRAINS: none   LOCAL MEDICATIONS USED:  MARCAINE     SPECIMEN:  Source of Specimen:  portion of right and left tube  DISPOSITION OF SPECIMEN:  PATHOLOGY  COUNTS:  YES  TOURNIQUET:  * No tourniquets in log *  DICTATION: .Other Dictation: Dictation Number [id number]  PLAN OF CARE: Admit to inpatient   PATIENT DISPOSITION:  PACU - hemodynamically stable.   Delay start of Pharmacological VTE agent (>24hrs) due to surgical blood loss or risk of bleeding: no

## 2019-07-09 NOTE — Anesthesia Preprocedure Evaluation (Signed)
Anesthesia Evaluation  Patient identified by MRN, date of birth, ID band Patient awake    Reviewed: Allergy & Precautions, NPO status , Patient's Chart, lab work & pertinent test results  Airway Mallampati: II  TM Distance: >3 FB Neck ROM: Full    Dental no notable dental hx.    Pulmonary asthma ,    Pulmonary exam normal breath sounds clear to auscultation       Cardiovascular negative cardio ROS Normal cardiovascular exam Rhythm:Regular Rate:Normal     Neuro/Psych PSYCHIATRIC DISORDERS Anxiety Depression negative neurological ROS     GI/Hepatic negative GI ROS, Neg liver ROS,   Endo/Other  PCOS  Renal/GU negative Renal ROS  negative genitourinary   Musculoskeletal negative musculoskeletal ROS (+)   Abdominal   Peds  Hematology negative hematology ROS (+)   Anesthesia Other Findings S/p abdominal cerclage  Reproductive/Obstetrics (+) Pregnancy                             Anesthesia Physical Anesthesia Plan  ASA: II  Anesthesia Plan: Spinal   Post-op Pain Management:    Induction:   PONV Risk Score and Plan: Treatment may vary due to age or medical condition  Airway Management Planned: Natural Airway  Additional Equipment:   Intra-op Plan:   Post-operative Plan:   Informed Consent: I have reviewed the patients History and Physical, chart, labs and discussed the procedure including the risks, benefits and alternatives for the proposed anesthesia with the patient or authorized representative who has indicated his/her understanding and acceptance.     Dental advisory given  Plan Discussed with: CRNA  Anesthesia Plan Comments:         Anesthesia Quick Evaluation

## 2019-07-09 NOTE — Progress Notes (Signed)
[name] was referred for history of depression/anxiety. * Referral screened out by Clinical Social Worker because none of the following criteria appear to apply: ~ History of anxiety/depression during this pregnancy, or of post-partum depression following prior delivery. ~ Diagnosis of anxiety and/or depression within last 3 years. Per further chart review, [name] diagnosed with anxiety in 2016.  OR * [name]'s symptoms currently being treated with medication and/or therapy. Per [facility] records, [name] stable on Buspar.     Please contact the Clinical Social Worker if needs arise, by [facility] request, or if [name] scores greater than 9/yes to question 10 on Edinburgh Postpartum Depression Screen.    Kimberly Delgado, MSW, LCSW [facility] [place] [facility] [place] [phone]

## 2019-07-09 NOTE — H&P (Signed)
Kimberly Delgado is a 34 y.o. female presenting  For primary c/s, tl due to abdominal cerclage, desires sterilization. Hx notable for PTB treated with Makena. Also had cervical incompetence  OB History    Gravida  4   Para  2   Term  1   Preterm  1   AB  1   Living  1     SAB  1   TAB  0   Ectopic  0   Multiple  0   Live Births  2          Past Medical History:  Diagnosis Date  . AMA (advanced maternal age) multigravida 26+   . Anxiety   . Depression   . Exercise-induced asthma    per pt no inhaler  . History of pyelonephritis   . Incompetent cervix    12-23-2018  first trimester  . PCOS (polycystic ovarian syndrome)   . Seasonal allergies   . Wears contact lenses    Past Surgical History:  Procedure Laterality Date  . ABDOMINAL CERCLAGE N/A 12/25/2018   Procedure: TRANS CERCLAGE ABDOMINAL;  Surgeon: Governor Specking, MD;  Location: [facility];  Service: Gynecology;  Laterality: N/A;  . CERVICAL CERCLAGE N/A 02/17/2015   Procedure: CERCLAGE CERVICAL;  Surgeon: Servando Salina, MD;  Location: [facility];  Service: Gynecology;  Laterality: N/A;  EDD: 08/16/15  . WISDOM TOOTH EXTRACTION     Family History: family history includes Asthma in her maternal grandfather; Diabetes in her maternal grandfather, mother, and another family member. Social History:  reports that she has never smoked. She has never used smokeless tobacco. She reports previous alcohol use. She reports that she does not use drugs.     Maternal Diabetes: No Genetic Screening: Normal Maternal Ultrasounds/Referrals: Normal Fetal Ultrasounds or other Referrals:  None Maternal Substance Abuse:  No Significant Maternal Medications:  None Significant Maternal Lab Results:  Group B Strep negative and Rh negative Other Comments:  hx PTB treated with Makena, abdominal cerclage  Review of Systems  All other systems reviewed and are negative.  History   Blood pressure  112/83, pulse 70, temperature 97.8 F (36.6 C), resp. rate 17, height 5\' 6"  (1.676 m), weight 80.1 kg, last menstrual period 10/07/2018, SpO2 98 %. Exam Physical Exam  Constitutional: She is oriented to person, place, and time. She appears well-developed and well-nourished.  HENT:  Head: Atraumatic.  Eyes: EOM are normal.  Cardiovascular: Regular rhythm.  Respiratory: Effort normal.  GI: Soft.  gravid  Genitourinary:    Genitourinary Comments: Cervix closed OOP Uterus gravid   Musculoskeletal:        General: No edema.     Cervical back: Neck supple.  Neurological: She is alert and oriented to person, place, and time.  Skin: Skin is warm.    Prenatal labs: ABO, Rh: --/--/O NEG (02/10 1016) Antibody: POS (02/10 1016) Rubella: Immune (07/21 0000) RPR: NON REACTIVE (02/10 1014)  HBsAg: Negative (07/21 0000)  HIV: Non-reactive (07/21 0000)  GBS: Negative/-- (01/13 0000)   Assessment/Plan: Abdominal cerclage Rh negative Term gestation Desires sterilization P) Primary C/S, TL. Risk of surgery including infection, bleeding, possible need for blood transfusion and its risk, injury to bladder, bowel, ureter, desires sterilization, failure rate 1/300, nonreversible, thermal injury   Zaydn Gutridge A Shakea Isip 07/09/2019, 7:40 AM

## 2019-07-09 NOTE — Anesthesia Postprocedure Evaluation (Signed)
Anesthesia Post Note  Patient: Kimberly Delgado  Procedure(s) Performed: Primary CESAREAN SECTION WITH BILATERAL TUBAL LIGATION (Bilateral )     Patient location during evaluation: PACU Anesthesia Type: Spinal Level of consciousness: oriented and awake and alert Pain management: pain level controlled Vital Signs Assessment: post-procedure vital signs reviewed and stable Respiratory status: spontaneous breathing, respiratory function stable and patient connected to nasal cannula oxygen Cardiovascular status: blood pressure returned to baseline and stable Postop Assessment: no headache, no backache and no apparent nausea or vomiting Anesthetic complications: no    Last Vitals:  Vitals:   07/09/19 0945 07/09/19 0952  BP: 100/65   Pulse: 62 65  Resp: (!) 21 (!) 22  Temp:  36.5 C  SpO2: 100% 98%    Last Pain:  Vitals:   07/09/19 0904  TempSrc: Oral   Pain Goal:    LLE Motor Response: Purposeful movement (07/09/19 0945)   RLE Motor Response: Purposeful movement (07/09/19 0945)       Epidural/Spinal Function Cutaneous sensation: Able to Discern Pressure (07/09/19 0945), Patient able to flex knees: Yes (07/09/19 0945), Patient able to lift hips off bed: No (07/09/19 0945), Back pain beyond tenderness at insertion site: No (07/09/19 0945), Progressively worsening motor and/or sensory loss: No (07/09/19 0945), Bowel and/or bladder incontinence post epidural: No (07/09/19 0945)  [place]

## 2019-07-09 NOTE — Lactation Note (Signed)
This note was copied from a baby's chart. Lactation Consultation Note  Patient Name: Kimberly Delgado [id number] Date: 07/09/2019 Reason for consult: Initial assessment;Term P2.  Mom breastfed her first baby for 16 months.  Newborn is 5 hours old and has been to breast twice.  Baby is currently sleeping in mom's arms.  Instructed to watch for feeding cues and call for assist prn.  Mom states she is familiar with hand expression and has seen colostrum.  Colostrum containers and spoon left.  Instructed to express between feeds and give baby expressed milk.  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  List of available pumps given to her.  She will research and let us know what pump she desires.  Breastfeeding consultation services information and reviewed.  Maternal Data Has patient been taught Hand Expression?: Yes Does the patient have breastfeeding experience prior to this delivery?: Yes  Feeding Feeding Type: Breast Fed  LATCH Score                   Interventions    Lactation Tools Discussed/Used     Consult Status Consult Status: Follow-up Date: 07/10/19 Follow-up type: In-patient    Ave Filter 07/09/2019, 2:12 PM

## 2019-07-10 ENCOUNTER — Encounter (HOSPITAL_COMMUNITY): Payer: Self-pay | Admitting: Obstetrics and Gynecology

## 2019-07-10 LAB — CBC
HCT: 32.8 % — ABNORMAL LOW (ref 36.0–46.0)
Hemoglobin: 10.7 g/dL — ABNORMAL LOW (ref 12.0–15.0)
MCH: 29.1 pg (ref 26.0–34.0)
MCHC: 32.6 g/dL (ref 30.0–36.0)
MCV: 89.1 fL (ref 80.0–100.0)
Platelets: 176 10*3/uL (ref 150–400)
RBC: 3.68 MIL/uL — ABNORMAL LOW (ref 3.87–5.11)
RDW: 13.7 % (ref 11.5–15.5)
WBC: 11.4 10*3/uL — ABNORMAL HIGH (ref 4.0–10.5)
nRBC: 0 % (ref 0.0–0.2)

## 2019-07-10 LAB — BIRTH TISSUE RECOVERY COLLECTION (PLACENTA DONATION)

## 2019-07-10 NOTE — Lactation Note (Addendum)
This note was copied from a baby's chart. Lactation Consultation Note  Patient Name: Kimberly Delgado [id number] Date: 07/10/2019  [name] Kimberly Delgado now 73 hours old.Infant with six percent weight loss first night/  Mom with csection.  Mom requested to see lactation because she wants to go ahead and get her breast pump.  Mom wanted Medela Free Style flex.  Issued mom Medela Freestyle Flex.  Mom denies need for breastfeeding services.  Reports she feels they are breastfeeding well.Urged to feed on cue and 8-12 or more times day.  Call lactation as needed.   Maternal Data    Feeding Feeding Type: Breast Fed  [facility] Score                   Interventions    Lactation Tools Discussed/Used     Consult Status      Dyson Sevey Thompson Caul 07/10/2019, 12:58 PM

## 2019-07-10 NOTE — Progress Notes (Signed)
Post Partum Day 1 S/P PCS for a abdominal cerclage  Feeding: unknown Subjective: No HA, SOB, CP, F/C, breast symptoms. Normal vaginal bleeding, no clots. Pain controlled.  ambulating without symptoms.  voiding without difficulty has passed flatus.  Objective: BP 92/60 (BP Location: Left Arm)   Pulse 65   Temp (!) 97.5 F (36.4 C) (Oral)   Resp 18   Ht 5\' 6"  (1.676 m)   Wt 80.1 kg   LMP 10/07/2018 (Approximate)   SpO2 99%   Breastfeeding Unknown   BMI 28.49 kg/m  I&O reviewed.   Physical Exam:  General: alert, cooperative and no distress Lochia: appropriate Uterine Fundus: firm DVT Evaluation: No evidence of DVT seen on physical exam. Ext: No c/c/e Incision: Clean dry intact incision incision: Dressed, clean  Recent Labs    07/10/19 0554  HGB 10.7*  HCT 32.8*      Assessment/Plan: 34 y.o.  PPD #1 .  normal postpartum exam normal post-operative exam Continue current postpartum care Ambulate   LOS: 1 day   Ala Dach 07/10/2019 12:38 PM

## 2019-07-10 NOTE — Plan of Care (Signed)
Problem: Education: Goal: Knowledge of General Education information will improve Description: Including pain rating scale, medication(s)/side effects and non-pharmacologic comfort measures Outcome: Completed/Met   Problem: Health Behavior/Discharge Planning: Goal: Ability to manage health-related needs will improve Outcome: Completed/Met   Problem: Clinical Measurements: Goal: Ability to maintain clinical measurements within normal limits will improve Outcome: Completed/Met Goal: Will remain free from infection Outcome: Completed/Met Goal: Diagnostic test results will improve Outcome: Completed/Met Goal: Respiratory complications will improve Outcome: Completed/Met Goal: Cardiovascular complication will be avoided Outcome: Completed/Met   Problem: Activity: Goal: Risk for activity intolerance will decrease Outcome: Completed/Met   Problem: Elimination: Goal: Will not experience complications related to bowel motility Outcome: Completed/Met Goal: Will not experience complications related to urinary retention Outcome: Completed/Met   Problem: Pain Managment: Goal: General experience of comfort will improve Outcome: Completed/Met   Problem: Safety: Goal: Ability to remain free from injury will improve Outcome: Completed/Met   Problem: Skin Integrity: Goal: Risk for impaired skin integrity will decrease Outcome: Completed/Met   Problem: Education: Goal: Knowledge of condition will improve Outcome: Completed/Met   Problem: Activity: Goal: Will verbalize the importance of balancing activity with adequate rest periods Outcome: Completed/Met Goal: Ability to tolerate increased activity will improve Outcome: Completed/Met   Problem: Life Cycle: Goal: Chance of risk for complications during the postpartum period will decrease Outcome: Completed/Met   Problem: Role Relationship: Goal: Ability to demonstrate positive interaction with newborn will improve Outcome:  Completed/Met   Problem: Skin Integrity: Goal: Demonstration of wound healing without infection will improve Outcome: Completed/Met   Problem: Education: Goal: Knowledge of General Education information will improve Description: Including pain rating scale, medication(s)/side effects and non-pharmacologic comfort measures Outcome: Completed/Met   Problem: Health Behavior/Discharge Planning: Goal: Ability to manage health-related needs will improve Outcome: Completed/Met   Problem: Clinical Measurements: Goal: Ability to maintain clinical measurements within normal limits will improve Outcome: Completed/Met Goal: Will remain free from infection Outcome: Completed/Met Goal: Diagnostic test results will improve Outcome: Completed/Met Goal: Respiratory complications will improve Outcome: Completed/Met Goal: Cardiovascular complication will be avoided Outcome: Completed/Met   Problem: Activity: Goal: Risk for activity intolerance will decrease Outcome: Completed/Met   Problem: Elimination: Goal: Will not experience complications related to bowel motility Outcome: Completed/Met Goal: Will not experience complications related to urinary retention Outcome: Completed/Met   Problem: Pain Managment: Goal: General experience of comfort will improve Outcome: Completed/Met   Problem: Safety: Goal: Ability to remain free from injury will improve Outcome: Completed/Met   Problem: Skin Integrity: Goal: Risk for impaired skin integrity will decrease Outcome: Completed/Met   Problem: Education: Goal: Knowledge of condition will improve Outcome: Completed/Met   Problem: Activity: Goal: Will verbalize the importance of balancing activity with adequate rest periods Outcome: Completed/Met Goal: Ability to tolerate increased activity will improve Outcome: Completed/Met   Problem: Life Cycle: Goal: Chance of risk for complications during the postpartum period will  decrease Outcome: Completed/Met   Problem: Role Relationship: Goal: Ability to demonstrate positive interaction with newborn will improve Outcome: Completed/Met   Problem: Skin Integrity: Goal: Demonstration of wound healing without infection will improve Outcome: Completed/Met   Problem: Education: Goal: Knowledge of General Education information will improve Description: Including pain rating scale, medication(s)/side effects and non-pharmacologic comfort measures Outcome: Completed/Met   Problem: Health Behavior/Discharge Planning: Goal: Ability to manage health-related needs will improve Outcome: Completed/Met   Problem: Clinical Measurements: Goal: Ability to maintain clinical measurements within normal limits will improve Outcome: Completed/Met Goal: Will remain free from infection Outcome: Completed/Met Goal: Diagnostic test  results will improve Outcome: Completed/Met Goal: Respiratory complications will improve Outcome: Completed/Met Goal: Cardiovascular complication will be avoided Outcome: Completed/Met   Problem: Activity: Goal: Risk for activity intolerance will decrease Outcome: Completed/Met   Problem: Elimination: Goal: Will not experience complications related to bowel motility Outcome: Completed/Met Goal: Will not experience complications related to urinary retention Outcome: Completed/Met   Problem: Pain Managment: Goal: General experience of comfort will improve Outcome: Completed/Met   Problem: Safety: Goal: Ability to remain free from injury will improve Outcome: Completed/Met   Problem: Skin Integrity: Goal: Risk for impaired skin integrity will decrease Outcome: Completed/Met   Problem: Education: Goal: Knowledge of condition will improve Outcome: Completed/Met   Problem: Activity: Goal: Will verbalize the importance of balancing activity with adequate rest periods Outcome: Completed/Met Goal: Ability to tolerate increased activity  will improve Outcome: Completed/Met   Problem: Life Cycle: Goal: Chance of risk for complications during the postpartum period will decrease Outcome: Completed/Met   Problem: Role Relationship: Goal: Ability to demonstrate positive interaction with newborn will improve Outcome: Completed/Met   Problem: Skin Integrity: Goal: Demonstration of wound healing without infection will improve Outcome: Completed/Met

## 2019-07-10 NOTE — Op Note (Signed)
NAME: Kimberly Delgado, Kimberly Delgado MEDICAL RECORD [id number] ACCOUNT [id number] DATE OF BIRTH:[birth year] FACILITY: [facility] LOCATION: [facility] PHYSICIAN:Alaisha Eversley A. Keneisha Heckart, MD  OPERATIVE REPORT  DATE OF PROCEDURE:  07/09/2019  PREOPERATIVE DIAGNOSIS:  Abdominal cerclage, term gestation, desires sterilization.  PROCEDURE:  Primary cesarean section, Buddy Duty hysterotomy ,modified Pomeroy tubal ligation.  POSTOPERATIVE DIAGNOSIS:  Incomplete breech presentation, term gestation, abdominal cerclage, desires sterilization.  ANESTHESIA:  Spinal.  SURGEON:  Servando Salina, MD  ASSISTANT:  Maida Sale RNFA.  DESCRIPTION OF PROCEDURE:  Under adequate spinal anesthesia, the patient was placed in supine position with a left lateral tilt.  She was sterilely prepped and draped in the usual fashion.  An indwelling Foley catheter was sterilely placed.  Marcaine  0.25% was injected along the planned Pfannenstiel skin incision site.  Pfannenstiel skin incision was then made, carried down to the rectus fascia.  Rectus fascia was opened transversely.  Rectus fascia was then bluntly and sharply dissected off the  rectus muscle in superior and inferior fashion.  The rectus muscles were split in the midline.  The parietal peritoneum was entered bluntly and extended.  A self-retaining Alexis retractor was then placed.  Vesicouterine peritoneum was opened  transversely.  Bladder was displaced inferiorly with blunt dissection.  A curvilinear low transverse uterine incision was then made and extended bluntly in a cephalic and caudad manner.  It was then noted that the baby was in the breech presentation and  while securing the presenting left foot, artificial rupture occurred clear fluid noted.  Subsequent delivery of a live female, using the usual breech maneuver was accomplished.  Delayed cord clamping x1 minute was done.  The baby was bulb suctioned on  the abdomen.  Cord was clamped, cut.  The baby was  transferred to the awaiting pediatrician who assigned Apgars of 9 and 9 at one and five minutes.  Placenta was manually removed.  Uterine cavity was cleaned of debris.  Uterine incision had no extension.   It was closed in 2 layers, the first layer of 0 Monocryl running lock stitch, second layer was imbricated using 0 Monocryl suture.  Small bleeding along the peritoneal edges were cauterized.  Both fallopian tubes and ovaries appeared normal.  The left  fallopian tube was identified down to its fimbriated end.  The midportion was grasped with a Babcock.  The underlying mesosalpinx was opened with cautery.  Proximally and distally, it was tied with 0 chromic sutures x2 and the intervening segment of tube  was removed.  Same procedure was performed on the contralateral side with removal of the  right midportion of the tube.  The abdomen had been irrigated and suctioned of debris.  The Alexis retractor was then removed.  The parietal peritoneum closed with 2-0  Vicryl.  The rectus fascia was closed with 0 Vicryl x2.  The subcutaneous area was irrigated, small bleeders cauterized.  Interrupted 2-0 plain sutures placed and the skin approximated using 4-0 Vicryl subcuticular closure.  SPECIMEN:  Portion of right and left fallopian tubes sent to pathology.  Placenta was not sent.  ESTIMATED BLOOD LOSS:  616 mL.  INTRAOPERATIVE FLUIDS:  2300 mL.  URINE OUTPUT:  200 mL.  COUNTS:  Sponge and instrument counts x2 was correct.  COMPLICATIONS:  None.  DISPOSITION:  The patient tolerated the procedure well and was transferred to recovery in stable condition.  PN/NUANCE  D:07/10/2019 T:07/10/2019 JOB:[id number]

## 2019-07-11 LAB — TYPE AND SCREEN
ABO/RH(D): O NEG
Antibody Screen: POSITIVE
Unit division: 0
Unit division: 0

## 2019-07-11 LAB — BPAM RBC
Blood Product Expiration Date: 202102222359
Blood Product Expiration Date: 202102232359
Unit Type and Rh: 9500
Unit Type and Rh: 9500

## 2019-07-11 MED ORDER — OXYCODONE HCL 5 MG PO TABS: 5.0000 mg | ORAL_TABLET | ORAL | 0 refills | Status: DC | PRN

## 2019-07-11 MED ORDER — SENNOSIDES-DOCUSATE SODIUM 8.6-50 MG PO TABS: 2.0000 | ORAL_TABLET | ORAL | 1 refills | Status: DC

## 2019-07-11 MED ORDER — IBUPROFEN 800 MG PO TABS: 800.0000 mg | ORAL_TABLET | Freq: Four times a day (QID) | ORAL | 1 refills | Status: DC

## 2019-07-11 NOTE — Discharge Summary (Signed)
OB Discharge Summary  Patient Name: Kimberly Delgado DOB: 07/10/1985 MRN: [id number]  Date of admission: 07/09/2019 Delivering MD: COUSINS, SHERONETTE   Date of discharge: 07/11/2019  Admitting diagnosis: Request for sterilization [Z30.2] Postpartum care following cesarean delivery [Z39.2] Intrauterine pregnancy: [redacted]w[redacted]d     Secondary diagnosis:Principal Problem:   Postpartum care following cesarean delivery Active Problems:   Request for sterilization  Additional problems: None     Discharge diagnosis: Term Pregnancy Delivered                                                                     Post partum procedures:None  Augmentation: Not applicable, primary cesarean section  Complications: None  Hospital course:  Sceduled C/S   34 y.o. yo [id number] at [redacted]w[redacted]d was admitted to the hospital 07/09/2019 for scheduled cesarean section with the following indication:Primary cesarean section for abdominal cerclage.  Membrane Rupture Time/Date: 8:14 AM ,07/09/2019   Patient delivered a Viable infant.07/09/2019  Details of operation can be found in separate operative note.  Pateint had an uncomplicated postpartum course.  She is ambulating, tolerating a regular diet, passing flatus, and urinating well. Patient is discharged home in stable condition on  07/11/19      On day of discharge patient notes ambulating without difficulty, voiding, normal bowel movement, pain controlled with p.o. medications.  She had no chest pain or shortness of breath.  She requested early discharge.  Physical exam  Vitals:   07/10/19 0014 07/10/19 1712 07/10/19 1934 07/11/19 0608  BP: 92/60 111/79 107/72 92/72  Pulse: 65 60 64 70  Resp:  18 18 18   Temp: (!) 97.5 F (36.4 C) 98.5 F (36.9 C) 97.7 F (36.5 C) 98.3 F (36.8 C)  TempSrc: Oral Oral Oral Oral  SpO2:  100% 99%   Weight:      Height:       General: alert, cooperative and no distress Lochia: appropriate Uterine Fundus:  firm Incision: Healing well with no significant drainage DVT Evaluation: No evidence of DVT seen on physical exam. Labs: Lab Results  Component Value Date   WBC 11.4 (H) 07/10/2019   HGB 10.7 (L) 07/10/2019   HCT 32.8 (L) 07/10/2019   MCV 89.1 07/10/2019   PLT 176 07/10/2019   CMP Latest Ref Rng & Units 09/03/2016  Glucose 65 - 99 mg/dL 84  BUN 6 - 20 mg/dL 11  Creatinine 0.57 - 1.00 mg/dL 0.85  Sodium 134 - 144 mmol/L 140  Potassium 3.5 - 5.2 mmol/L 4.3  Chloride 96 - 106 mmol/L 97  CO2 18 - 29 mmol/L 24  Calcium 8.7 - 10.2 mg/dL 9.1  Total Protein 6.0 - 8.5 g/dL 6.7  Total Bilirubin 0.0 - 1.2 mg/dL <0.2  Alkaline Phos 39 - 117 IU/L 68  AST 0 - 40 IU/L 15  ALT 0 - 32 IU/L 11    Discharge instruction: per After Visit Summary and "Baby and Me Booklet".  After Visit Meds:  Allergies as of 07/11/2019      Reactions   Latex Rash      Medication List    TAKE these medications   acetaminophen 325 MG tablet Commonly known as: TYLENOL Take 650 mg by mouth 4 (four) times daily  as needed.   busPIRone 5 MG tablet Commonly known as: BUSPAR Take 5 mg by mouth daily.   ibuprofen 800 MG tablet Commonly known as: ADVIL Take 1 tablet (800 mg total) by mouth every 6 (six) hours.   magnesium oxide 400 MG tablet Commonly known as: MAG-OX Take 400 mg by mouth daily.   oxyCODONE 5 MG immediate release tablet Commonly known as: Oxy IR/ROXICODONE Take 1-2 tablets (5-10 mg total) by mouth every 4 (four) hours as needed for moderate pain.   prenatal multivitamin Tabs tablet Take 1 tablet by mouth daily at 12 noon.   senna-docusate 8.6-50 MG tablet Commonly known as: Senokot-S Take 2 tablets by mouth daily. Start taking on: July 12, 2019       Diet: routine diet  Activity: Advance as tolerated. Pelvic rest for 6 weeks.   Outpatient follow up:6 weeks Follow up Appt:No future appointments. Follow up visit: No follow-ups on file.  Postpartum contraception: Tubal  Ligation  Newborn Data: Live born female  Birth Weight: 7 lb 6.5 oz (3360 g) APGAR: 46, 9  Newborn Delivery   Birth date/time: 07/09/2019 08:14:00 Delivery type: C-Section, Low Transverse Trial of labor: No C-section categorization: Primary      Baby Feeding: Breast Disposition:home with mother   07/11/2019 Ala Dach, MD

## 2019-07-12 LAB — SURGICAL PATHOLOGY

## 2019-07-29 MED FILL — busPIRone HCL 5 MG TABS: 5 | 30 days supply | Qty: 60 | Fill #6

## 2019-08-25 DIAGNOSIS — Z130 Encounter for screening for diseases of the blood and blood-forming organs and certain disorders involving the immune mechanism: Secondary | ICD-10-CM | POA: Diagnosis not present

## 2019-08-25 DIAGNOSIS — Z1151 Encounter for screening for human papillomavirus (HPV): Secondary | ICD-10-CM | POA: Diagnosis not present

## 2019-09-24 MED FILL — BUSPIRONE HCL 7.5 MG TABS: 7.5 | 30 days supply | Qty: 60 | Fill #0

## 2019-10-20 DIAGNOSIS — D2261 Melanocytic nevi of right upper limb, including shoulder: Secondary | ICD-10-CM | POA: Diagnosis not present

## 2019-10-20 DIAGNOSIS — D224 Melanocytic nevi of scalp and neck: Secondary | ICD-10-CM | POA: Diagnosis not present

## 2019-10-20 DIAGNOSIS — D485 Neoplasm of uncertain behavior of skin: Secondary | ICD-10-CM | POA: Diagnosis not present

## 2019-10-20 DIAGNOSIS — D2262 Melanocytic nevi of left upper limb, including shoulder: Secondary | ICD-10-CM | POA: Diagnosis not present

## 2019-10-20 DIAGNOSIS — L573 Poikiloderma of Civatte: Secondary | ICD-10-CM | POA: Diagnosis not present

## 2019-10-20 DIAGNOSIS — D1801 Hemangioma of skin and subcutaneous tissue: Secondary | ICD-10-CM | POA: Diagnosis not present

## 2019-10-20 DIAGNOSIS — D2272 Melanocytic nevi of left lower limb, including hip: Secondary | ICD-10-CM | POA: Diagnosis not present

## 2019-10-20 DIAGNOSIS — L821 Other seborrheic keratosis: Secondary | ICD-10-CM | POA: Diagnosis not present

## 2019-10-20 DIAGNOSIS — D2271 Melanocytic nevi of right lower limb, including hip: Secondary | ICD-10-CM | POA: Diagnosis not present

## 2019-10-20 DIAGNOSIS — D225 Melanocytic nevi of trunk: Secondary | ICD-10-CM | POA: Diagnosis not present

## 2019-12-17 MED FILL — busPIRone HCL 7.5 MG TABS: 7.5 | 30 days supply | Qty: 60 | Fill #1

## 2020-01-14 DIAGNOSIS — F411 Generalized anxiety disorder: Secondary | ICD-10-CM | POA: Diagnosis not present

## 2020-01-14 DIAGNOSIS — K429 Umbilical hernia without obstruction or gangrene: Secondary | ICD-10-CM | POA: Diagnosis not present

## 2020-02-03 MED FILL — busPIRone HCL 7.5 MG TABS: 7.5 | 30 days supply | Qty: 60 | Fill #2

## 2020-03-09 DIAGNOSIS — Z124 Encounter for screening for malignant neoplasm of cervix: Secondary | ICD-10-CM | POA: Diagnosis not present

## 2020-03-09 DIAGNOSIS — Z13220 Encounter for screening for lipoid disorders: Secondary | ICD-10-CM | POA: Diagnosis not present

## 2020-03-09 DIAGNOSIS — F411 Generalized anxiety disorder: Secondary | ICD-10-CM | POA: Diagnosis not present

## 2020-03-09 DIAGNOSIS — Z0000 Encounter for general adult medical examination without abnormal findings: Secondary | ICD-10-CM | POA: Diagnosis not present

## 2020-12-23 IMAGING — US US MFM OB DETAIL+14 WK
1 series · 12 of 28 positions shown · non-contrast
Comparison: none

[Series 1: us mfm ob detail+14 wk · 111 acquisitions, 12 frames shown]
[im 5/111]
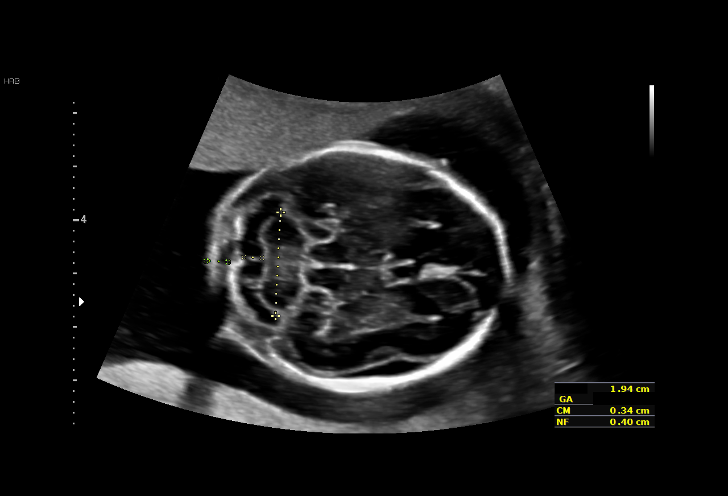
[im 13/111]
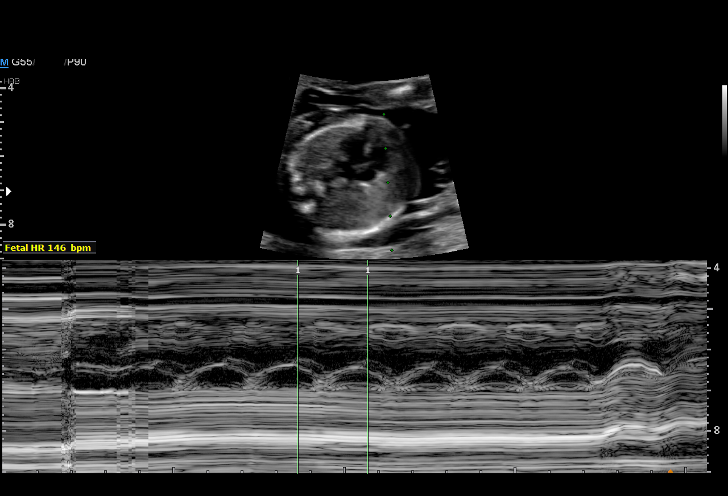
[im 21/111]
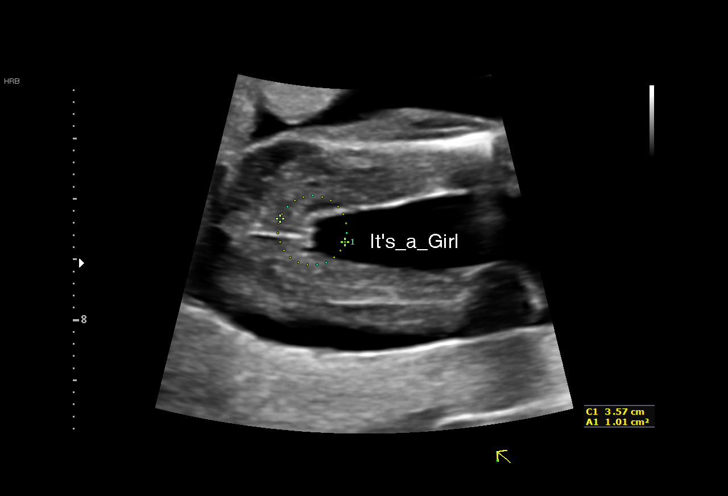
[im 33/111]
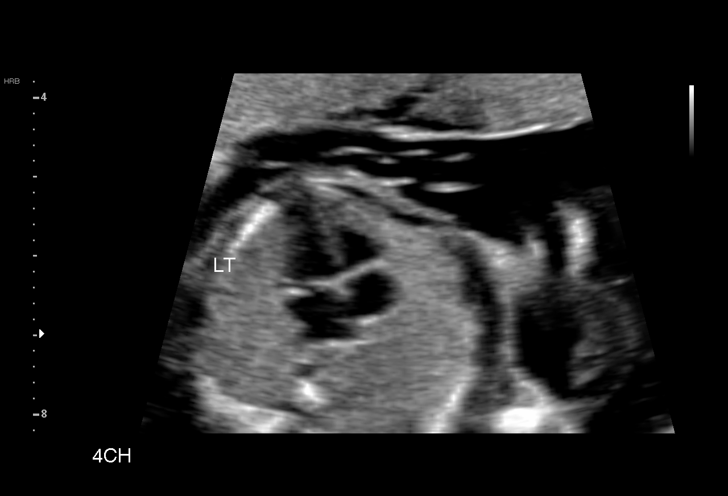
[im 41/111]
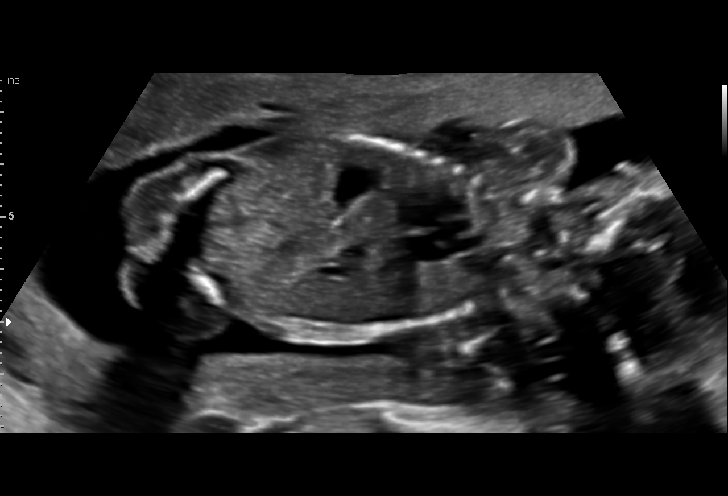
[im 49/111]
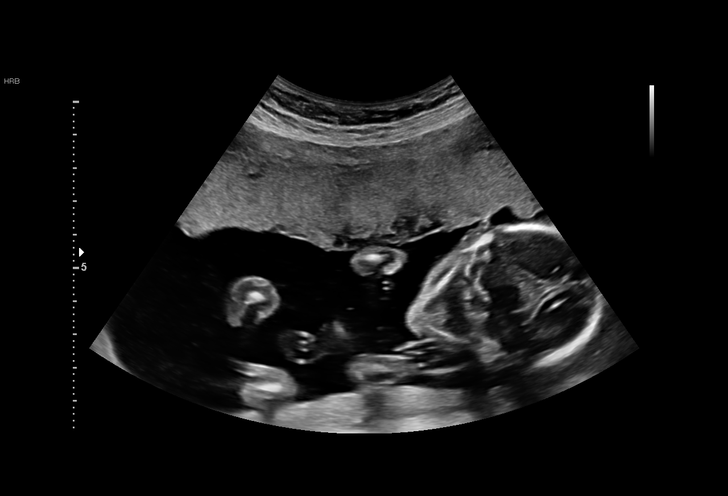
[im 62/111]
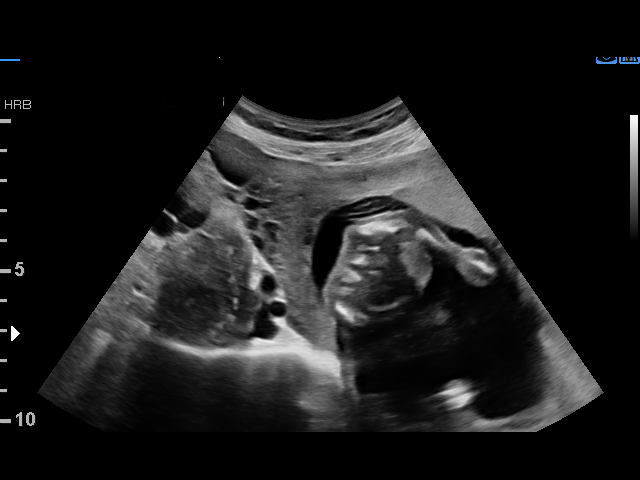
[im 70/111]
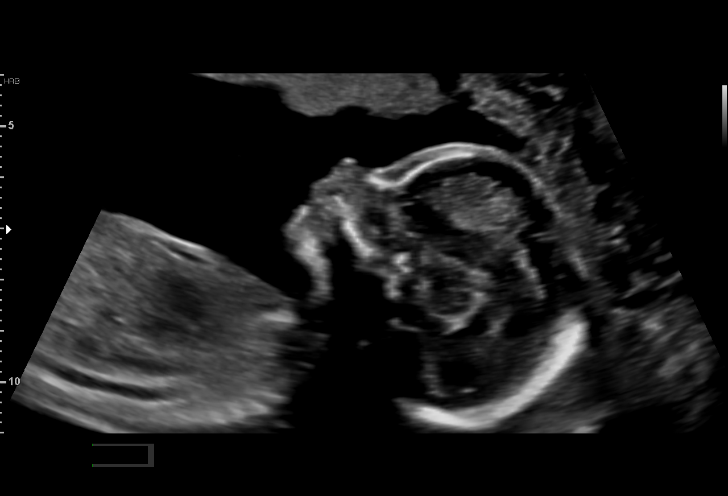
[im 78/111]
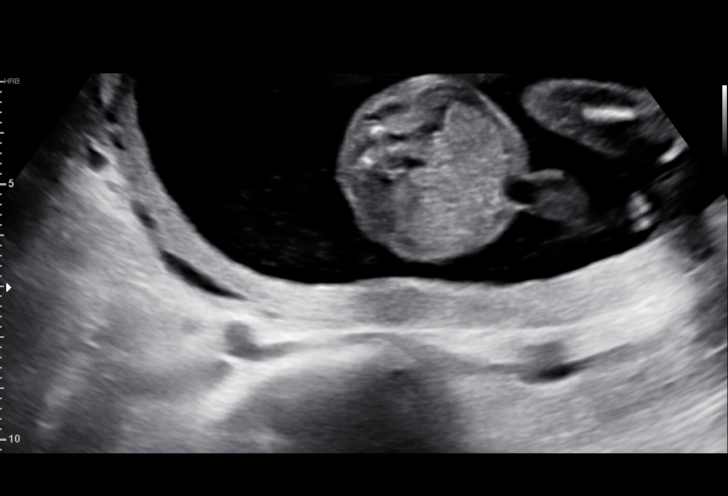
[im 90/111]
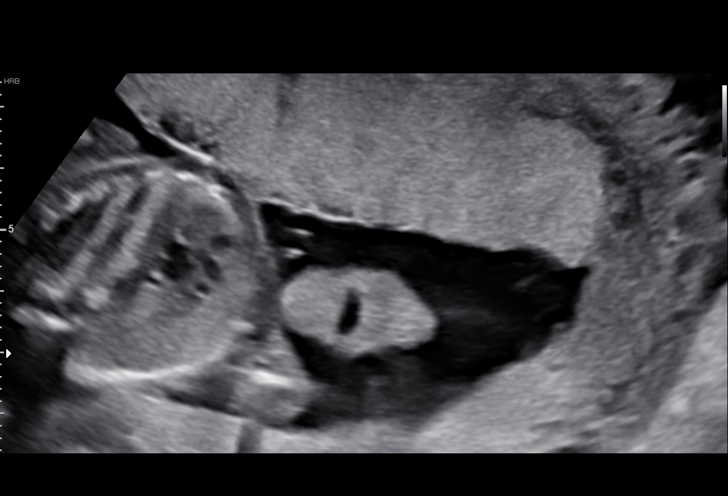
[im 98/111]
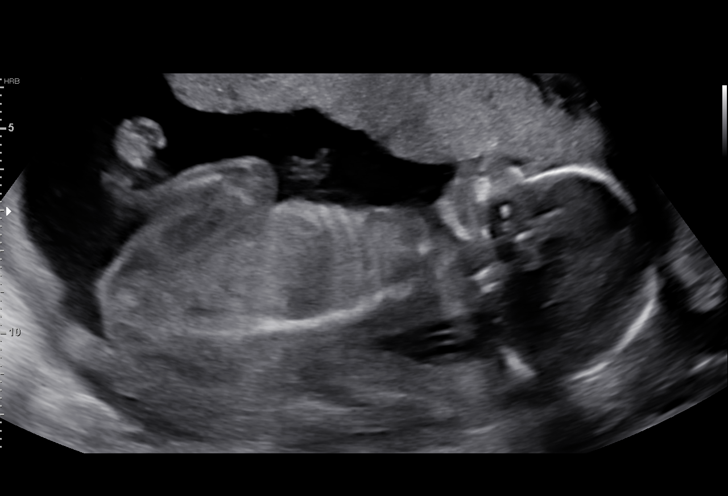
[im 106/111]
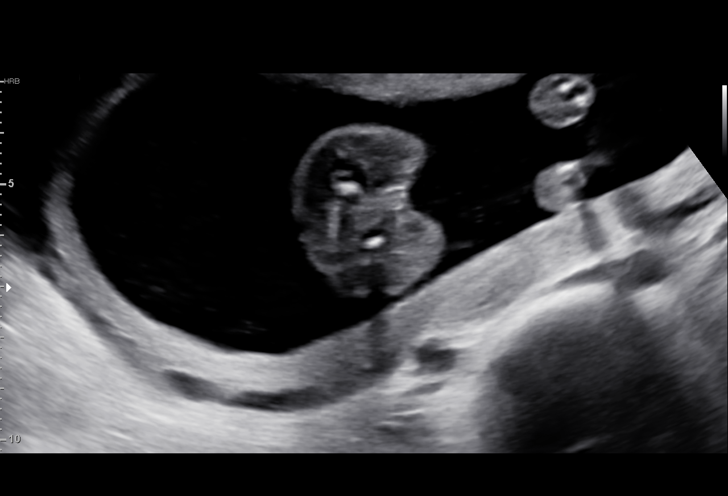

[12 of 28 positions shown; findings below may reference images not displayed]

[facility]

                                                            [facility]

                                                       [facility]
 ----------------------------------------------------------------------

 ----------------------------------------------------------------------
Indications

  Abnormal biochemical screen (quad) for
  Trisomy 21 (DSR [DATE])
  History of cervical cerclage, currently
  pregnant (abdominal cerclage 12/25/18)
  Encounter for antenatal screening for
  malformations
  Rh negative state in antepartum
  Poor obstetric history: Previous midtrimester
  loss (cerv insuff 24w2d)
  19 weeks gestation of pregnancy
 ----------------------------------------------------------------------
Vital Signs

 BMI:
Fetal Evaluation

 Num Of Fetuses:         1
 Fetal Heart Rate(bpm):  146
 Cardiac Activity:       Observed
 Presentation:           Cephalic
 Placenta:               Anterior
 P. Cord Insertion:      Visualized, central
 Amniotic Fluid
 AFI FV:      Within normal limits

                             Largest Pocket(cm)

Biometry

 BPD:      43.1  mm     G. Age:  19w 0d         52  %    CI:         73.6   %    70 - 86
                                                         FL/HC:      18.4   %    16.1 -
 HC:      159.6  mm     G. Age:  18w 6d         33  %    HC/AC:      1.16        1.09 -
 AC:      137.4  mm     G. Age:  19w 1d         52  %    FL/BPD:     68.0   %
 FL:       29.3  mm     G. Age:  19w 0d         44  %    FL/AC:      21.3   %    20 - 24
 CER:      19.4  mm     G. Age:  18w 5d         41  %
 NFT:         4  mm
 LV:        6.6  mm
 CM:        3.4  mm

 Est. FW:     272  gm    0 lb 10 oz      49  %
OB History

 Gravidity:    3         Term:   1        Prem:   1
 Living:       1
Gestational Age

 LMP:           19w 0d        Date:  10/07/18                 EDD:   07/14/19
 U/S Today:     19w 0d                                        EDD:   07/14/19
 Best:          19w 0d     Det. By:  LMP  (10/07/18)          EDD:   07/14/19
Anatomy

 Cranium:               Appears normal         Aortic Arch:            Appears normal
 Cavum:                 Appears normal         Ductal Arch:            Appears normal
 Ventricles:            Appears normal         Diaphragm:              Appears normal
 Choroid Plexus:        Appears normal         Stomach:                Appears normal, left
                                                                       sided
 Cerebellum:            Appears normal         Abdomen:                Appears normal
 Posterior Fossa:       Appears normal         Abdominal Wall:         Appears nml (cord
                                                                       insert, abd wall)
 Nuchal Fold:           Appears normal         Cord Vessels:           Appears normal (3
                                                                       vessel cord)
 Face:                  Appears normal         Kidneys:                Appear normal
                        (orbits and profile)
 Lips:                  Appears normal         Bladder:                Appears normal
 Thoracic:              Appears normal         Spine:                  Appears normal
 Heart:                 Appears normal         Upper Extremities:      Appears normal
                        (4CH, axis, and
                        situs)
 RVOT:                  Appears normal         Lower Extremities:      Appears normal
 LVOT:                  Appears normal

 Other:  Female gender Heels and 5th digit visualized. Nasal bone visualized.
         3VV and 3VTV visualized.
Cervix Uterus Adnexa
 Cervix
 Length:           4.38  cm.
 Normal appearance by transabdominal scan.

 Uterus
 No abnormality visualized.

 Left Ovary
 No adnexal mass visualized.

 Right Ovary
 No adnexal mass visualized.

 Cul De Sac
 No free fluid seen.

 Adnexa
 No abnormality visualized.
Comments

 This patient was seen for a detailed fetal anatomy scan as
 she had a Quad screen which indicated an increased risk for
 Down syndrome of 1 in 232.  The patient reports a prior
 preterm birth.  She had an abdominal cerclage placed and is
 currently treated with weekly progesterone injections. She
 denies any other significant past medical history and denies
 any problems in her current pregnancy.
 She was informed that the fetal growth and amniotic fluid
 level were appropriate for her gestational age.
 There were no obvious fetal anomalies noted on today's
 ultrasound exam.
 The patient was informed that anomalies may be missed due
 to technical limitations. If the fetus is in a suboptimal position
 or maternal habitus is increased, visualization of the fetus in
 the maternal uterus may be impaired.
 Due to the elevated Down syndrome risk noted on her quad
 screen, the patient was advised regarding the availability of
 an amniocentesis for definitive diagnosis of fetal aneuploidy.
 The patient and her husband declined this test today.  She
 was also advised regarding the availability of a cell free DNA
 test which may further refine her Down syndrome risk.  The
 patient met with our genetic counselor following today's
 ultrasound exam and had a cell free DNA test drawn.  Our
 genetic counselor will notify the patient regarding the cell free
 DNA test results.
 Follow-up as indicated.

## 2021-05-29 ENCOUNTER — Ambulatory Visit (INDEPENDENT_AMBULATORY_CARE_PROVIDER_SITE_OTHER): Payer: Commercial Managed Care - PPO | Admitting: Podiatry

## 2021-05-29 ENCOUNTER — Other Ambulatory Visit: Payer: 59

## 2021-05-29 ENCOUNTER — Ambulatory Visit
Admission: RE | Admit: 2021-05-29 | Discharge: 2021-05-29 | Disposition: A | Discharge status: Home / Self Care | Payer: Commercial Managed Care - PPO | Source: Ambulatory Visit | Attending: Podiatry | Admitting: Podiatry

## 2021-05-29 ENCOUNTER — Ambulatory Visit (INDEPENDENT_AMBULATORY_CARE_PROVIDER_SITE_OTHER): Payer: Commercial Managed Care - PPO

## 2021-05-29 ENCOUNTER — Other Ambulatory Visit: Payer: Self-pay

## 2021-05-29 DIAGNOSIS — S92031A Displaced avulsion fracture of tuberosity of right calcaneus, initial encounter for closed fracture: Secondary | ICD-10-CM

## 2021-05-29 DIAGNOSIS — T148XXA Other injury of unspecified body region, initial encounter: Secondary | ICD-10-CM

## 2021-05-29 DIAGNOSIS — S9031XA Contusion of right foot, initial encounter: Secondary | ICD-10-CM

## 2021-05-29 DIAGNOSIS — R609 Edema, unspecified: Secondary | ICD-10-CM | POA: Diagnosis not present

## 2021-05-29 NOTE — Patient Instructions (Addendum)
I have ordered a MRI of the right ankle for [facility]. If you do not hear for them about scheduling within the next 1 week, or you have any questions please give [place] a call at [phone].    [facility] An [employer] is a type of bandage (dressing) for the foot and leg. The dressing is a gauze wrap that is soaked with a type of medicine called zinc oxide. The gauze may also include other lotions and medicines that help in wound healing, such as calamine. An Unna boot may be used to treat: Open sores (ulcers) on the foot, heel, or leg. Swelling from disorders that affect the veins or lymphatic system (lymphedema). Skin conditions such as chronic inflammation caused by poor blood flow (stasis dermatitis). The dressing is applied by a health care provider. The gauze is wrapped around your lower extremity in several layers, usually starting at the toes and going upward to the knee. A dry outer wrap goes over the medicated wrap for support and compression.  Before applying the [employer], your health care provider will clean your leg and foot and may apply an antibiotic ointment. You may be asked to raise (elevate) your leg for a while to reduce swelling before the boot is applied. The boot will dry and harden after it is applied. The boot may need to be changed or replaced about twice a week. Follow these instructions at home: [place] as told by your health care provider. You may need to wear a slipper or shoe over the boot that is one or two sizes larger than normal. Check the skin around the boot every day. Tell your health care provider about any concerns. Do not stick anything inside the boot to scratch your skin. Doing that increases your risk of infection. Keep your [employer] clean and dry. Check every day for signs of infection. Check for: Redness, swelling, or pain in your foot or toes. Fluid or blood coming from the boot. Pus or a bad smell coming from the  boot. Remove the boot and call your health care provider if you have signs of poor blood flow, such as: Your toes tingle or become numb. Your toes turn cold or turn blue or pale. Your toes are more swollen or painful. You are unable to move your toes. Activity You may walk with the boot once it has dried. Ask your health care provider how much walking is safe for you. Avoid sitting for a long time without moving. Get up to take short walks as told by your health care provider. This is important to improve blood flow. Bathing Do not take baths, swim, or use a hot tub until your health care provider approves. Ask your health care provider if you may take showers. If your health care provider approves a bath or a shower, do not let the Unna boot get wet. If you take a shower, cover the boot with a watertight covering. If you take a bath, keep your leg with the boot out of the tub. General instructions Keep your leg elevated above the level of your heart while you are sitting or lying down. This will decrease swelling. Do not sit with your knee bent for long periods of time. Take over-the-counter and prescription medicines only as told by your health care provider. Do not use any products that contain nicotine or tobacco, such as cigarettes, e-cigarettes, and chewing tobacco. These can delay healing. If you  need help quitting, ask your health care provider. Keep all follow-up visits as told by your health care provider. This is important. Contact a health care provider if: Your skin feels itchy inside the boot. You have a burning sensation, a rash, or itchy, red, swollen areas of skin (hives) in the boot area. You have a fever or chills. You have any signs of infection, such as: New redness, swelling, or pain. More fluid or blood coming from the boot. Pus or a bad smell coming from the boot. You have increased numbness or pain in your foot or toes. You have any changes in skin color on your  foot or toes, such as the skin turning blue or pale or developing patchy areas with spots. Your boot has been damaged or feels like it is no longer fitting properly. Summary An [place] boot is a type of bandage (dressing) system for the foot and leg. The dressing is a gauze wrap that is soaked with a type of medicine (zinc oxide) to treat foot, heel, or leg ulcers, swelling from disorders that affect the veins or lymphatic system (lymphedema), and skin conditions caused by poor blood flow (stasis dermatitis). This dressing is applied by a health care provider. After it is applied, the boot will dry and harden. The boot may need to be changed or replaced about twice a week. Let your health care provider know if you have any signs of poor blood flow or infection. This information is not intended to replace advice given to you by your health care provider. Make sure you discuss any questions you have with your health care provider. Document Revised: 09/01/2018 Document Reviewed: 01/21/2018 Elsevier Patient Education  [place].

## 2021-05-31 NOTE — Progress Notes (Signed)
Subjective:   Patient ID: Kimberly Delgado, female   DOB: 36 y.o.   MRN: [id number]   HPI 35 year old female presents the office today for concerns of right foot injury while jumping [place] a trampoline park.  This happened on May 23, 2021 around 11 AM.  She states that she landed on her foot and had immediate pain and swelling and unable to bear weight.  She went to urgent care and was told she had fractures off of her calcaneus.  She has been nonweightbearing.  She still has swelling present.  No other injuries at this time.   Review of Systems  All other systems reviewed and are negative.  Past Medical History:  Diagnosis Date   AMA (advanced maternal age) multigravida 35+    Anxiety    Depression    Exercise-induced asthma    per pt no inhaler   History of pyelonephritis    Incompetent cervix    12-23-2018  first trimester   PCOS (polycystic ovarian syndrome)    Seasonal allergies    Wears contact lenses     Past Surgical History:  Procedure Laterality Date   ABDOMINAL CERCLAGE N/A 12/25/2018   Procedure: TRANS CERCLAGE ABDOMINAL;  Surgeon: Governor Specking, MD;  Location: [facility];  Service: Gynecology;  Laterality: N/A;   CERVICAL CERCLAGE N/A 02/17/2015   Procedure: CERCLAGE CERVICAL;  Surgeon: Servando Salina, MD;  Location: [facility];  Service: Gynecology;  Laterality: N/A;  EDD: 08/16/15   CESAREAN SECTION WITH BILATERAL TUBAL LIGATION Bilateral 07/09/2019   Procedure: Primary CESAREAN SECTION WITH BILATERAL TUBAL LIGATION;  Surgeon: Servando Salina, MD;  Location: [facility];  Service: Obstetrics;  Laterality: Bilateral;  EDD: 07/14/19 Tracey RNFA   WISDOM TOOTH EXTRACTION       Current Outpatient Medications:    acetaminophen (TYLENOL) 325 MG tablet, Take 650 mg by mouth 4 (four) times daily as needed., Disp: , Rfl:    busPIRone (BUSPAR) 5 MG tablet, Take 5 mg by mouth daily., Disp: , Rfl:    ibuprofen (ADVIL) 200 MG tablet, 1 tablet  with food or milk as needed, Disp: , Rfl:    ibuprofen (ADVIL) 800 MG tablet, Take 1 tablet (800 mg total) by mouth every 6 (six) hours., Disp: 60 tablet, Rfl: 1   magnesium oxide (MAG-OX) 400 MG tablet, Take 400 mg by mouth daily., Disp: , Rfl:    oxyCODONE (OXY IR/ROXICODONE) 5 MG immediate release tablet, Take 1-2 tablets (5-10 mg total) by mouth every 4 (four) hours as needed for moderate pain., Disp: 30 tablet, Rfl: 0   Prenatal Vit-Fe Fumarate-FA (PRENATAL MULTIVITAMIN) TABS tablet, Take 1 tablet by mouth daily at 12 noon. , Disp: , Rfl:    senna-docusate (SENOKOT-S) 8.6-50 MG tablet, Take 2 tablets by mouth daily., Disp: 601 tablet, Rfl: 1  Allergies  Allergen Reactions   Latex Rash         Objective:  Physical Exam  General: AAO x3, NAD  Dermatological: Mild ecchymosis is present there is no skin breakdown or open lesions.  Vascular: Dorsalis Pedis artery and Posterior Tibial artery pedal pulses are 2/4 bilateral with immedate capillary fill time.  There is no pain with calf compression, swelling, warmth, erythema.   Neruologic: Grossly intact via light touch bilateral.   Musculoskeletal: Tenderness palpation to both the medial and lateral aspects around the ankle.  There is tenderness along the toe navicular joint as well as the lateral aspect just distal to the fibula.  No  pain to the fifth metatarsal base of the foot otherwise.  Moderate edema is present.  Muscular strength 5/5 in all groups tested bilateral.  Calf is supple.  Gait: Using crutches, nonweightbearing      Assessment:    Right foot avulsion fractures    Plan:  -Treatment options discussed including all alternatives, risks, and complications -Etiology of symptoms were discussed -X-rays today reviewed.  Avulsion fractures noted of the lateral aspect along the calcaneus area.  Also concern for possible avulsion off the talonavicular. -Unna boot was applied for compression given the edema.  Advised him to  remove this. -Continue nonweightbearing -MRI ordered  Trula Slade DPM

## 2021-06-01 ENCOUNTER — Other Ambulatory Visit: Payer: Self-pay

## 2021-06-21 ENCOUNTER — Ambulatory Visit (INDEPENDENT_AMBULATORY_CARE_PROVIDER_SITE_OTHER): Payer: Commercial Managed Care - PPO

## 2021-06-21 ENCOUNTER — Other Ambulatory Visit: Payer: Self-pay

## 2021-06-21 ENCOUNTER — Ambulatory Visit (INDEPENDENT_AMBULATORY_CARE_PROVIDER_SITE_OTHER): Payer: Commercial Managed Care - PPO | Admitting: Podiatry

## 2021-06-21 DIAGNOSIS — S92031A Displaced avulsion fracture of tuberosity of right calcaneus, initial encounter for closed fracture: Secondary | ICD-10-CM

## 2021-06-24 NOTE — Progress Notes (Signed)
Subjective: 36 year old female presents the office today for follow-up evaluation of right foot fracture.  She still in the cam boot and staying off of her foot with the use of crutches.  Pain level is 4/10 and is intermittent.  She has been putting some weight on her foot but it did cause discomfort so she went back to nonweightbearing last week.  Since then her symptoms have improved.  Objective: AAO x3, NAD DP/PT pulses palpable bilaterally, CRT less than 3 seconds There is decreased swelling present.  There is a bruise present in the arch of the foot.  Mild scab on the medial navicular but no other areas of pinpoint tenderness.  Flexor, extensor tendons appear to be intact. No pain with calf compression, swelling, warmth, erythema  Assessment: Subchondral fracture talar head, medial navicular  Plan: -All treatment options discussed with the patient including all alternatives, risks, complications.  -X-rays obtained reviewed.  On the medial aspect the navicular likely small avulsion.  No obvious fracture line otherwise. -At this point recommend continuing cam boot.  Discussed with her as she starts to feel better she can transition to partial then full weightbearing in the cam boot.  Continue to ice and elevate as well as compression. -Patient encouraged to call the office with any questions, concerns, change in symptoms.   Return in about 4 weeks (around 07/19/2021).  Repeat x-ray foot and ankle  Trula Slade DPM

## 2021-06-25 ENCOUNTER — Encounter: Payer: Self-pay | Admitting: Podiatry

## 2021-07-03 ENCOUNTER — Encounter: Payer: Self-pay | Admitting: Podiatry

## 2021-07-03 NOTE — Telephone Encounter (Signed)
Please advise 

## 2021-07-04 ENCOUNTER — Encounter: Payer: Self-pay | Admitting: Podiatry

## 2021-07-16 NOTE — Telephone Encounter (Signed)
Can someone please see about getting her in this week? Thanks!

## 2021-07-17 NOTE — Telephone Encounter (Signed)
Spoke with patient and squeezed  her in at 2/22 at 4pm. If this not ok please let me know and I will try to find another spot for the patient.

## 2021-07-19 ENCOUNTER — Other Ambulatory Visit: Payer: Self-pay

## 2021-07-19 ENCOUNTER — Ambulatory Visit (INDEPENDENT_AMBULATORY_CARE_PROVIDER_SITE_OTHER): Payer: Commercial Managed Care - PPO

## 2021-07-19 ENCOUNTER — Ambulatory Visit (INDEPENDENT_AMBULATORY_CARE_PROVIDER_SITE_OTHER): Payer: Commercial Managed Care - PPO | Admitting: Podiatry

## 2021-07-19 DIAGNOSIS — S92031A Displaced avulsion fracture of tuberosity of right calcaneus, initial encounter for closed fracture: Secondary | ICD-10-CM

## 2021-07-19 DIAGNOSIS — S92124G Nondisplaced fracture of body of right talus, subsequent encounter for fracture with delayed healing: Secondary | ICD-10-CM

## 2021-07-19 DIAGNOSIS — E559 Vitamin D deficiency, unspecified: Secondary | ICD-10-CM | POA: Diagnosis not present

## 2021-07-20 ENCOUNTER — Other Ambulatory Visit
Admission: RE | Admit: 2021-07-20 | Discharge: 2021-07-20 | Disposition: A | Discharge status: Home / Self Care | Payer: Commercial Managed Care - PPO | Attending: Podiatry | Admitting: Podiatry

## 2021-07-20 DIAGNOSIS — S92124G Nondisplaced fracture of body of right talus, subsequent encounter for fracture with delayed healing: Secondary | ICD-10-CM | POA: Diagnosis not present

## 2021-07-20 DIAGNOSIS — E559 Vitamin D deficiency, unspecified: Secondary | ICD-10-CM | POA: Diagnosis present

## 2021-07-20 DIAGNOSIS — Y939 Activity, unspecified: Secondary | ICD-10-CM | POA: Diagnosis not present

## 2021-07-20 LAB — COMPREHENSIVE METABOLIC PANEL
ALT: 13 U/L (ref 0–44)
AST: 16 U/L (ref 15–41)
Albumin: 4 g/dL (ref 3.5–5.0)
Alkaline Phosphatase: 37 U/L — ABNORMAL LOW (ref 38–126)
Anion gap: 4 — ABNORMAL LOW (ref 5–15)
BUN: 13 mg/dL (ref 6–20)
CO2: 26 mmol/L (ref 22–32)
Calcium: 8.4 mg/dL — ABNORMAL LOW (ref 8.9–10.3)
Chloride: 104 mmol/L (ref 98–111)
Creatinine, Ser: 0.97 mg/dL (ref 0.44–1.00)
GFR, Estimated: >60 mL/min (ref >60)
Glucose, Bld: 99 mg/dL (ref 70–99)
Potassium: 4.2 mmol/L (ref 3.5–5.1)
Sodium: 134 mmol/L — ABNORMAL LOW (ref 135–145)
Total Bilirubin: 0.6 mg/dL (ref 0.3–1.2)
Total Protein: 7.2 g/dL (ref 6.5–8.1)

## 2021-07-20 LAB — VITAMIN D 25 HYDROXY (VIT D DEFICIENCY, FRACTURES): Vit D, 25-Hydroxy: 30.37 ng/mL (ref 30–100)

## 2021-07-23 ENCOUNTER — Ambulatory Visit: Payer: Commercial Managed Care - PPO | Admitting: Podiatry

## 2021-07-24 NOTE — Progress Notes (Signed)
Subjective: 36 year old female presents the office today for follow-up evaluation of right foot fracture.  She states that she was doing better but about couple weeks ago she drove to work and then put her boot back on after the day of driving her foot and ankle has been hurting since then.  She still has some swelling to the foot.  She states that it still swells and is uncomfortable after on her foot for long period of time.  She is returning nonweightbearing in the cam boot with use of crutches.  No new injuries that she reports.   Objective: AAO x3, NAD DP/PT pulses palpable bilaterally, CRT less than 3 seconds There is still mild swelling present to the foot, ankle.  There is tenderness to palpation along the navicular, talus although not a specific area pinpoint tenderness. No pain with calf compression, swelling, warmth, erythema  Assessment: Subchondral fracture talar head, medial navicular  Plan: -All treatment options discussed with the patient including all alternatives, risks, complications.  -X-rays obtained reviewed.  Sclerosis noted on the talus.  No definitive evidence of acute fracture noted today but is noted on CT scan. -Given continuation of pain with evidence of fracture on the CT scan we will order a bone stimulator to see if this can help expedite healing.  I will also order blood work to include checking vitamin D level.  Continue nonweightbearing the cam boot for now but discussed that as she starts to feel better she can transition to weightbearing as tolerated in the cam boot.  Also we discussed physical therapy. -Patient encouraged to call the office with any questions, concerns, change in symptoms.   Trula Slade DPM

## 2021-08-20 ENCOUNTER — Encounter: Payer: Self-pay | Admitting: Podiatry

## 2022-02-26 ENCOUNTER — Other Ambulatory Visit: Payer: Self-pay

## 2022-02-26 MED ORDER — FLUARIX QUADRIVALENT 0.5 ML IM SUSY
PREFILLED_SYRINGE | INTRAMUSCULAR | 0 refills | Status: DC
  Filled 2022-02-26: qty 0.5, 1d supply, fill #0

## 2022-02-28 ENCOUNTER — Other Ambulatory Visit: Payer: Self-pay

## 2022-03-06 ENCOUNTER — Ambulatory Visit
Admission: RE | Admit: 2022-03-06 | Discharge: 2022-03-06 | Disposition: A | Discharge status: Home / Self Care | Payer: Commercial Managed Care - PPO | Source: Ambulatory Visit | Attending: Emergency Medicine | Admitting: Emergency Medicine

## 2022-03-06 ENCOUNTER — Ambulatory Visit (INDEPENDENT_AMBULATORY_CARE_PROVIDER_SITE_OTHER): Payer: Commercial Managed Care - PPO

## 2022-03-06 VITALS — BP 132/84 | HR 82 | Temp 98.7°F | Resp 16

## 2022-03-06 DIAGNOSIS — R059 Cough, unspecified: Secondary | ICD-10-CM

## 2022-03-06 DIAGNOSIS — R509 Fever, unspecified: Secondary | ICD-10-CM | POA: Diagnosis not present

## 2022-03-06 DIAGNOSIS — J189 Pneumonia, unspecified organism: Secondary | ICD-10-CM | POA: Diagnosis not present

## 2022-03-06 HISTORY — DX: Umbilical hernia without obstruction or gangrene: K42.9

## 2022-03-06 MED ORDER — GUAIFENESIN 400 MG PO TABS: ORAL_TABLET | ORAL | 0 refills | Status: DC

## 2022-03-06 MED ORDER — AMOXICILLIN-POT CLAVULANATE 875-125 MG PO TABS: 1.0000 | ORAL_TABLET | Freq: Two times a day (BID) | ORAL | 0 refills | Status: AC

## 2022-03-06 MED ORDER — TRIAMCINOLONE ACETONIDE 40 MG/ML IJ SUSP
40.0000 mg | Freq: Once | INTRAMUSCULAR | Status: AC
  Administered 2022-03-06: 40 mg via INTRAMUSCULAR

## 2022-03-06 MED ORDER — PROMETHAZINE-DM 6.25-15 MG/5ML PO SYRP: 5.0000 mL | ORAL_SOLUTION | Freq: Four times a day (QID) | ORAL | 0 refills | Status: DC | PRN

## 2022-03-06 MED ORDER — FLUCONAZOLE 150 MG PO TABS: ORAL_TABLET | ORAL | 0 refills | Status: DC

## 2022-03-06 NOTE — Discharge Instructions (Signed)
Please see below for list of medications that I recommend to treat you for presumed bacterial pneumonia and to alleviate your current symptoms:    Augmentin (amoxicillin - clavulanic acid):  take 1 tablet twice daily for 5 days, you can take it with or without food.  This antibiotic can cause upset stomach, this will resolve once antibiotics are complete.  You are welcome to use a probiotic, eat yogurt, take Imodium while taking this medication.  Please avoid other systemic medications such as Maalox, Pepto-Bismol or milk of magnesia as they can interfere with your body's ability to absorb the antibiotics.      Kenalog IM (triamcinolone):  To quickly address your significant respiratory inflammation, you were provided with an injection of Kenalog in the office today.  You should continue to feel the full benefit of the steroid for the next 24-36 hours.    Atrovent (ipratropium): This is an excellent nasal decongestant spray that does not cause rebound congestion, please instill 2 sprays into each nare with each use.  Please use the spray up to 4 times daily as needed.     ProAir, Ventolin, Proventil (albuterol): This inhaled medication contains a short acting beta agonist bronchodilator.  This medication works on the smooth muscle that opens and constricts of your airways by relaxing the muscle.  The result of relaxation of the smooth muscle is increased air movement and improved work of breathing.  This is a short acting medication that can be used every 4-6 hours as needed for increased work of breathing, shortness of breath, wheezing and excessive coughing.     Advil, Motrin (ibuprofen): This is a good anti-inflammatory medication which not only addresses aches, pains but also significantly reduces soft tissue inflammation of the upper airways that causes sinus and nasal congestion as well as inflammation of the lower airways which makes you feel like your breathing is constricted or your cough feel  tight.  I recommend that you take 400 mg every 8 hours for the next several days and then every 8 hours as needed.      Robitussin, Mucinex (guaifenesin): This is an expectorant.  This helps break up chest congestion and loosen up thick nasal drainage making phlegm and drainage more liquid and therefore easier to remove.  I recommend being 400 mg three times daily as needed.      Promethazine DM: Promethazine is both a nasal decongestant and an antinausea medication that makes most patients feel fairly sleepy.  The DM is dextromethorphan, a cough suppressant found in many over-the-counter cough medications.  Please take 5 mL before bedtime to minimize your cough which will help you sleep better.     Diflucan (fluconazole) : As you may or may not be aware, taking antibiotics can often cause patients to develop a vaginal yeast infection.  For this reason, I have provided you with a prescription for Diflucan, and antifungal medication used to treat vaginal yeast infections.  Please take the first Diflucan tablet on day 3 or 4 of your antibiotic therapy, and take the second Diflucan tablet 3 days later.  You do not need to pick up this prescription or take this medication unless you develop symptoms of vaginal yeast infection including thick, white vaginal discharge and/or vaginal itching.  This prescription has been provided as a Manufacturing engineer and for your convenience.  Please follow-up with either your primary care provider or with urgent care for repeat evaluation you of your lungs in the next 3 to 4 days  to ensure that you are improving and also to evaluate whether or not your treatment regimen needs to be adjusted.  Please be sure that you have a chest x-ray in the next 3 to 4 weeks to ensure resolution of the pneumonia.        Thank you for visiting urgent care today.  We appreciate the opportunity to participate in your care.

## 2022-03-06 NOTE — ED Triage Notes (Signed)
Pt states cough and low grade fever for the past 5 days.  Had tele health visit yesterday and was given inhaler and cough suppressant with no improvement.

## 2022-03-06 NOTE — ED Provider Notes (Signed)
[facility]    CSN: [id number] Arrival date & time: 03/06/22  0848    HISTORY   Chief Complaint  Patient presents with   Cough    Had telehealth visit yesterday and suspected adenovirus or pneumonia [facility] employee covid test was negative). Prescribed an inhaler, cough suppressant and nasal spray but coughing worse today. I need X-ray to check for pneumonia. - Entered by patient   HPI Kimberly Delgado is a pleasant, 36 y.o. female who presents to urgent care today. Patient complains of worsening cough for the past 5 days.  Patient states she was prescribed an albuterol inhaler, cough medication and nasal spray yesterday via telehealth (note not available in EMR upon my review), states coughing is actually worse instead of better now.  Patient states she is also had a low-grade fever.  Patient works for [employer], had a COVID-19 test through [employer] which was negative.  Patient is here today requesting x-ray to rule out pneumonia.  The history is provided by the patient.  Cough  Past Medical History:  Diagnosis Date   AMA (advanced maternal age) multigravida 35+    Anxiety    Depression    Exercise-induced asthma    per pt no inhaler   Hernia, umbilical    History of pyelonephritis    Incompetent cervix    12-23-2018  first trimester   PCOS (polycystic ovarian syndrome)    Seasonal allergies    Wears contact lenses    Patient Active Problem List   Diagnosis Date Noted   Request for sterilization 07/09/2019   Postpartum care following cesarean delivery 07/09/2019   SVD (spontaneous vaginal delivery) 08/12/2015   Postpartum care following vaginal delivery (3/18) 08/12/2015   Active labor at term 08/11/2015   Pregnancy 05/14/2015   Cervical incompetence @ 23 weeks 02/26/2013   Threatened preterm labor, antepartum 02/26/2013   Past Surgical History:  Procedure Laterality Date   ABDOMINAL CERCLAGE N/A 12/25/2018   Procedure: TRANS CERCLAGE  ABDOMINAL;  Surgeon: Governor Specking, MD;  Location[facility];  Service: Gynecology;  Laterality: N/A;   CERVICAL CERCLAGE N/A 02/17/2015   Procedure: CERCLAGE CERVICAL;  Surgeon: Servando Salina, MD;  Location: [facility];  Service: Gynecology;  Laterality: N/A;  EDD: 08/16/15   CESAREAN SECTION WITH BILATERAL TUBAL LIGATION Bilateral 07/09/2019   Procedure: Primary CESAREAN SECTION WITH BILATERAL TUBAL LIGATION;  Surgeon: Servando Salina, MD;  Location: [facility];  Service: Obstetrics;  Laterality: Bilateral;  EDD: 07/14/19 Tracey RNFA   WISDOM TOOTH EXTRACTION     OB History     Gravida  4   Para  3   Term  2   Preterm  1   AB  1   Living  2      SAB  1   IAB  0   Ectopic  0   Multiple  0   Live Births  3          Home Medications    Prior to Admission medications   Not on File    Family History Family History  Problem Relation Age of Onset   Diabetes Other    Diabetes Mother    Diabetes Maternal Grandfather    Asthma Maternal Grandfather    Social History Social History   Tobacco Use   Smoking status: Never   Smokeless tobacco: Never  Vaping Use   Vaping Use: Never used  Substance Use Topics   Alcohol use: Not  Currently   Drug use: Never   Allergies   Latex  Review of Systems Review of Systems  Respiratory:  Positive for cough.    Pertinent findings revealed after performing a 14 point review of systems has been noted in the history of present illness.  Physical Exam Triage Vital Signs ED Triage Vitals  Enc Vitals Group     BP 03/23/21 0827 (!) 147/82     Pulse Rate 03/23/21 0827 72     Resp 03/23/21 0827 18     Temp 03/23/21 0827 98.3 F (36.8 C)     Temp Source 03/23/21 0827 Oral     SpO2 03/23/21 0827 98 %     Weight --      Height --      Head Circumference --      Peak Flow --      Pain Score 03/23/21 0826 5     Pain Loc --      Pain Edu? --      Excl. in [place]? --   No data found.  Updated Vital  Signs BP 132/84 (BP Location: Left Arm)   Pulse 82   Temp 98.7 F (37.1 C) (Oral)   Resp 16   LMP 02/12/2022 (Approximate)   SpO2 96%   Physical Exam Vitals and nursing note reviewed.  Constitutional:      General: She is awake. She is not in acute distress.    Appearance: Normal appearance. She is well-developed, well-groomed and normal weight. She is ill-appearing.  HENT:     Head: Normocephalic and atraumatic. No abrasion.     Salivary Glands: Right salivary gland is tender. Right salivary gland is not diffusely enlarged. Left salivary gland is tender. Left salivary gland is not diffusely enlarged.     Right Ear: Hearing, tympanic membrane, ear canal and external ear normal. No drainage. No middle ear effusion. There is no impacted cerumen. Tympanic membrane is not erythematous or bulging.     Left Ear: Hearing, tympanic membrane, ear canal and external ear normal. No drainage.  No middle ear effusion. There is no impacted cerumen. Tympanic membrane is not erythematous or bulging.     Nose: Rhinorrhea present. No nasal deformity, septal deviation, mucosal edema or congestion. Rhinorrhea is clear.     Right Turbinates: Enlarged and swollen. Not pale.     Left Turbinates: Enlarged and swollen. Not pale.     Right Sinus: No maxillary sinus tenderness or frontal sinus tenderness.     Left Sinus: No maxillary sinus tenderness or frontal sinus tenderness.     Mouth/Throat:     Lips: Pink. No lesions.     Mouth: Mucous membranes are moist. No oral lesions.     Dentition: Normal dentition.     Tongue: No lesions. Tongue does not deviate from midline.     Palate: No mass and lesions.     Pharynx: Oropharynx is clear. Uvula midline. No pharyngeal swelling, oropharyngeal exudate, posterior oropharyngeal erythema or uvula swelling.     Tonsils: No tonsillar exudate or tonsillar abscesses. 0 on the right. 0 on the left.     Comments: Post nasal drip Eyes:     General: Lids are normal.         Right eye: No discharge.        Left eye: No discharge.     Extraocular Movements: Extraocular movements intact.     Conjunctiva/sclera: Conjunctivae normal.     Right eye: Right conjunctiva is not  injected.     Left eye: Left conjunctiva is not injected.  Neck:     Trachea: Trachea and phonation normal.  Cardiovascular:     Rate and Rhythm: Normal rate and regular rhythm.     Pulses: Normal pulses.     Heart sounds: Normal heart sounds, S1 normal and S2 normal. No murmur heard.    No friction rub. No gallop.  Pulmonary:     Effort: Pulmonary effort is normal. No tachypnea, bradypnea, accessory muscle usage, prolonged expiration, respiratory distress or retractions.     Breath sounds: Normal breath sounds and air entry. No stridor, decreased air movement or transmitted upper airway sounds. No decreased breath sounds, wheezing, rhonchi or rales.  Chest:     Chest wall: No tenderness.  Musculoskeletal:        General: Normal range of motion.     Cervical back: Normal range of motion and neck supple. Normal range of motion.  Lymphadenopathy:     Cervical: No cervical adenopathy.  Skin:    General: Skin is warm and dry.     Coloration: Skin is not ashen, cyanotic, jaundiced, mottled, pale or sallow.     Findings: No erythema or rash.     Nails: There is no clubbing.  Neurological:     General: No focal deficit present.     Mental Status: She is alert and oriented to person, place, and time.  Psychiatric:        Mood and Affect: Mood normal.        Behavior: Behavior normal. Behavior is cooperative.     Visual Acuity Right Eye Distance:   Left Eye Distance:   Bilateral Distance:    Right Eye Near:   Left Eye Near:    Bilateral Near:     [facility] Couse / Diagnostics / Procedures:     Radiology DG Chest 2 View  Result Date: 03/06/2022 CLINICAL DATA:  Worsening cough for 5 days with low-grade fever. EXAM: CHEST - 2 VIEW COMPARISON:  None Available. FINDINGS: Normal  cardiomediastinal contours. No signs of pleural effusion or interstitial edema. Bilateral lower lobe airspace opacities are identified, left greater than right. Visualized osseous structures are unremarkable. IMPRESSION: Bilateral lower lobe airspace opacities compatible with multifocal pneumonia. Followup PA and lateral chest X-ray is recommended in 3-4 weeks following trial of antibiotic therapy to ensure resolution and exclude underlying malignancy. Electronically Signed   By: Kerby Moors M.D.   On: 03/06/2022 09:30    Procedures Procedures (including critical care time) EKG  Pending results:  Labs Reviewed - No data to display  Medications Ordered in UC: Medications  triamcinolone acetonide (KENALOG-40) injection 40 mg (40 mg Intramuscular Given 03/06/22 0958)    UC Diagnoses / Final Clinical Impressions(s)   I have reviewed the triage vital signs and the nursing notes.  Pertinent labs & imaging results that were available during my care of the patient were reviewed by me and considered in my medical decision making (see chart for details).    Final diagnoses:  Multifocal pneumonia  Pneumonia of both lower lobes due to infectious organism   X-ray findings discussed with patient.  Patient provided with 5-day course of Augmentin as well as antifungal for possible antibiotic induced vaginal candidiasis.  Patient provided with guaifenesin expectorant and Promethazine DM for nighttime use as a cough suppressant.  Patient advised to continue using albuterol and Atrovent nasal spray.  Return precautions advised.  Patient further advised repeat x-ray is recommended in the  next 3 to 4 weeks to ensure resolution of the pneumonia.  ED Prescriptions     Medication Sig Dispense Auth. Provider   amoxicillin-clavulanate (AUGMENTIN) 875-125 MG tablet Take 1 tablet by mouth 2 (two) times daily for 5 days. 10 tablet Lynden Oxford Scales, PA-C   fluconazole (DIFLUCAN) 150 MG tablet Take 1 tablet  today.  Take second tablet 3 days later. 2 tablet Lynden Oxford Scales, PA-C   guaifenesin (HUMIBID E) 400 MG TABS tablet Take 1 tablet 3 times daily as needed for chest congestion and cough 21 tablet Lynden Oxford Scales, PA-C   promethazine-dextromethorphan (PROMETHAZINE-DM) 6.25-15 MG/5ML syrup Take 5 mLs by mouth 4 (four) times daily as needed for cough. 118 mL Lynden Oxford Scales, PA-C      PDMP not reviewed this encounter.  Disposition Upon Discharge:  Condition: stable for discharge home Home: take medications as prescribed; routine discharge instructions as discussed; follow up as advised.  Patient presented with an acute illness with associated systemic symptoms and significant discomfort requiring urgent management. In my opinion, this is a condition that a prudent lay person (someone who possesses an average knowledge of health and medicine) may potentially expect to result in complications if not addressed urgently such as respiratory distress, impairment of bodily function or dysfunction of bodily organs.   Routine symptom specific, illness specific and/or disease specific instructions were discussed with the patient and/or caregiver at length.   As such, the patient has been evaluated and assessed, work-up was performed and treatment was provided in alignment with urgent care protocols and evidence based medicine.  Patient/parent/caregiver has been advised that the patient may require follow up for further testing and treatment if the symptoms continue in spite of treatment, as clinically indicated and appropriate.  If the patient was tested for COVID-19, Influenza and/or RSV, then the patient/parent/guardian was advised to isolate at home pending the results of his/her diagnostic coronavirus test and potentially longer if they're positive. I have also advised pt that if his/her COVID-19 test returns positive, it's recommended to self-isolate for at least 10 days after symptoms  first appeared AND until fever-free for 24 hours without fever reducer AND other symptoms have improved or resolved. Discussed self-isolation recommendations as well as instructions for household member/close contacts as per the [facility] and [place] DHHS, and also gave patient the [place] packet with this information.  Patient/parent/caregiver has been advised to return to the [facility] or PCP in 3-5 days if no better; to PCP or the Emergency Department if new signs and symptoms develop, or if the current signs or symptoms continue to change or worsen for further workup, evaluation and treatment as clinically indicated and appropriate  The patient will follow up with their current PCP if and as advised. If the patient does not currently have a PCP we will assist them in obtaining one.   The patient may need specialty follow up if the symptoms continue, in spite of conservative treatment and management, for further workup, evaluation, consultation and treatment as clinically indicated and appropriate.  Patient/parent/caregiver verbalized understanding and agreement of plan as discussed.  All questions were addressed during visit.  Please see discharge instructions below for further details of plan.  Discharge Instructions:   Discharge Instructions      Please see below for list of medications that I recommend to treat you for presumed bacterial pneumonia and to alleviate your current symptoms:    Augmentin (amoxicillin - clavulanic acid):  take 1 tablet twice daily for 5  days, you can take it with or without food.  This antibiotic can cause upset stomach, this will resolve once antibiotics are complete.  You are welcome to use a probiotic, eat yogurt, take Imodium while taking this medication.  Please avoid other systemic medications such as Maalox, Pepto-Bismol or milk of magnesia as they can interfere with your body's ability to absorb the antibiotics.      Kenalog IM (triamcinolone):  To quickly address your  significant respiratory inflammation, you were provided with an injection of Kenalog in the office today.  You should continue to feel the full benefit of the steroid for the next 24-36 hours.    Atrovent (ipratropium): This is an excellent nasal decongestant spray that does not cause rebound congestion, please instill 2 sprays into each nare with each use.  Please use the spray up to 4 times daily as needed.     ProAir, Ventolin, Proventil (albuterol): This inhaled medication contains a short acting beta agonist bronchodilator.  This medication works on the smooth muscle that opens and constricts of your airways by relaxing the muscle.  The result of relaxation of the smooth muscle is increased air movement and improved work of breathing.  This is a short acting medication that can be used every 4-6 hours as needed for increased work of breathing, shortness of breath, wheezing and excessive coughing.     Advil, Motrin (ibuprofen): This is a good anti-inflammatory medication which not only addresses aches, pains but also significantly reduces soft tissue inflammation of the upper airways that causes sinus and nasal congestion as well as inflammation of the lower airways which makes you feel like your breathing is constricted or your cough feel tight.  I recommend that you take 400 mg every 8 hours for the next several days and then every 8 hours as needed.      Robitussin, Mucinex (guaifenesin): This is an expectorant.  This helps break up chest congestion and loosen up thick nasal drainage making phlegm and drainage more liquid and therefore easier to remove.  I recommend being 400 mg three times daily as needed.      Promethazine DM: Promethazine is both a nasal decongestant and an antinausea medication that makes most patients feel fairly sleepy.  The DM is dextromethorphan, a cough suppressant found in many over-the-counter cough medications.  Please take 5 mL before bedtime to minimize your cough  which will help you sleep better.     Diflucan (fluconazole) : As you may or may not be aware, taking antibiotics can often cause patients to develop a vaginal yeast infection.  For this reason, I have provided you with a prescription for Diflucan, and antifungal medication used to treat vaginal yeast infections.  Please take the first Diflucan tablet on day 3 or 4 of your antibiotic therapy, and take the second Diflucan tablet 3 days later.  You do not need to pick up this prescription or take this medication unless you develop symptoms of vaginal yeast infection including thick, white vaginal discharge and/or vaginal itching.  This prescription has been provided as a Manufacturing engineer and for your convenience.  Please follow-up with either your primary care provider or with urgent care for repeat evaluation you of your lungs in the next 3 to 4 days to ensure that you are improving and also to evaluate whether or not your treatment regimen needs to be adjusted.  Please be sure that you have a chest x-ray in the next 3 to 4 weeks to  ensure resolution of the pneumonia.        Thank you for visiting urgent care today.  We appreciate the opportunity to participate in your care.         This office note has been dictated using Museum/gallery curator.  Unfortunately, this method of dictation can sometimes lead to typographical or grammatical errors.  I apologize for your inconvenience in advance if this occurs.  Please do not hesitate to reach out to me if clarification is needed.      Lynden Oxford Scales, PA-C 03/06/22 1219

## 2022-03-15 ENCOUNTER — Ambulatory Visit
Admission: RE | Admit: 2022-03-15 | Discharge: 2022-03-15 | Disposition: A | Discharge status: Home / Self Care | Payer: Commercial Managed Care - PPO | Source: Ambulatory Visit | Attending: Emergency Medicine | Admitting: Emergency Medicine

## 2022-03-15 VITALS — BP 120/85 | HR 86 | Temp 97.7°F | Resp 16

## 2022-03-15 DIAGNOSIS — J189 Pneumonia, unspecified organism: Secondary | ICD-10-CM | POA: Diagnosis not present

## 2022-03-15 DIAGNOSIS — N3001 Acute cystitis with hematuria: Secondary | ICD-10-CM

## 2022-03-15 LAB — POCT URINALYSIS DIP (MANUAL ENTRY)
Bilirubin, UA: NEGATIVE
Glucose, UA: NEGATIVE mg/dL
Ketones, POC UA: NEGATIVE mg/dL
Nitrite, UA: NEGATIVE
Protein Ur, POC: NEGATIVE mg/dL
Spec Grav, UA: <=1.005 — AB (ref 1.010–1.025)
Urobilinogen, UA: 0.2 E.U./dL
pH, UA: 6.5 (ref 5.0–8.0)

## 2022-03-15 LAB — POCT URINE PREGNANCY: Preg Test, Ur: NEGATIVE

## 2022-03-15 MED ORDER — LEVOFLOXACIN 750 MG PO TABS: 750.0000 mg | ORAL_TABLET | Freq: Every day | ORAL | 0 refills | Status: AC

## 2022-03-15 MED ORDER — PHENAZOPYRIDINE HCL 200 MG PO TABS: 200.0000 mg | ORAL_TABLET | Freq: Three times a day (TID) | ORAL | 0 refills | Status: DC

## 2022-03-15 MED ORDER — ALBUTEROL SULFATE HFA 108 (90 BASE) MCG/ACT IN AERS: 2.0000 | INHALATION_SPRAY | Freq: Four times a day (QID) | RESPIRATORY_TRACT | 2 refills | Status: DC | PRN

## 2022-03-15 NOTE — ED Provider Notes (Signed)
[facility]    CSN: [id number] Arrival date & time: 03/15/22  0801    HISTORY   Chief Complaint  Patient presents with   Urinary Frequency    possible UTI - Entered by patient   HPI Kimberly Delgado is a pleasant, 36 y.o. female who presents to urgent care today. The patient c/o bladder discomfort that began yesterday. Patient endorses burning with urination, increased frequency of urination, increased urge to urinate, and sensation of incomplete emptying.  Patient denies abnormal odor of urine, blood in urine, suprapubic pain, perineal pain, incontinence of urine, altered mental status, flank pain, fever, chills, malaise, rigors, significant fatigue, abnormal vaginal discharge, vaginal itching, vaginal irritation, dyspareunia, genital lesion(s), and possible exposure to STD.    She states she recently finished course of antibiotics for bilateral lower lobe pneumonia as seen on chest x-ray. The patient reports she is still having congestion also and is continue to use albuterol inhaler, Atrovent nasal spray, Mucinex and nighttime medicine.  Patient states she does not feel any worse but does not feel any better.  The history is provided by the patient.   Past Medical History:  Diagnosis Date   AMA (advanced maternal age) multigravida 35+    Anxiety    Depression    Exercise-induced asthma    per pt no inhaler   Hernia, umbilical    History of pyelonephritis    Incompetent cervix    12-23-2018  first trimester   PCOS (polycystic ovarian syndrome)    Seasonal allergies    Wears contact lenses    Patient Active Problem List   Diagnosis Date Noted   Request for sterilization 07/09/2019   Postpartum care following cesarean delivery 07/09/2019   SVD (spontaneous vaginal delivery) 08/12/2015   Postpartum care following vaginal delivery (3/18) 08/12/2015   Active labor at term 08/11/2015   Pregnancy 05/14/2015   Cervical incompetence @ 23 weeks 02/26/2013    Threatened preterm labor, antepartum 02/26/2013   Past Surgical History:  Procedure Laterality Date   ABDOMINAL CERCLAGE N/A 12/25/2018   Procedure: TRANS CERCLAGE ABDOMINAL;  Surgeon: Governor Specking, MD;  Location: [facility];  Service: Gynecology;  Laterality: N/A;   CERVICAL CERCLAGE N/A 02/17/2015   Procedure: CERCLAGE CERVICAL;  Surgeon: Servando Salina, MD;  Location: [facility];  Service: Gynecology;  Laterality: N/A;  EDD: 08/16/15   CESAREAN SECTION WITH BILATERAL TUBAL LIGATION Bilateral 07/09/2019   Procedure: Primary CESAREAN SECTION WITH BILATERAL TUBAL LIGATION;  Surgeon: Servando Salina, MD;  Location: [facility];  Service: Obstetrics;  Laterality: Bilateral;  EDD: 07/14/19 Tracey RNFA   WISDOM TOOTH EXTRACTION     OB History     Gravida  4   Para  3   Term  2   Preterm  1   AB  1   Living  2      SAB  1   IAB  0   Ectopic  0   Multiple  0   Live Births  3          Home Medications    Prior to Admission medications   Medication Sig Start Date End Date Taking? Authorizing Provider  albuterol (VENTOLIN HFA) 108 (90 Base) MCG/ACT inhaler Inhale into the lungs. 03/05/22  Yes [provider]    Family History Family History  Problem Relation Age of Onset   Diabetes Other    Diabetes Mother    Diabetes Maternal Grandfather    Asthma Maternal Grandfather  Social History Social History   Tobacco Use   Smoking status: Never   Smokeless tobacco: Never  Vaping Use   Vaping Use: Never used  Substance Use Topics   Alcohol use: Not Currently   Drug use: Never   Allergies   Latex  Review of Systems Review of Systems Pertinent findings revealed after performing a 14 point review of systems has been noted in the history of present illness.  Physical Exam Triage Vital Signs ED Triage Vitals  Enc Vitals Group     BP 03/23/21 0827 (!) 147/82     Pulse Rate 03/23/21 0827 72     Resp 03/23/21 0827 18     Temp  03/23/21 0827 98.3 F (36.8 C)     Temp Source 03/23/21 0827 Oral     SpO2 03/23/21 0827 98 %     Weight --      Height --      Head Circumference --      Peak Flow --      Pain Score 03/23/21 0826 5     Pain Loc --      Pain Edu? --      Excl. in [place]? --   No data found.  Updated Vital Signs BP 120/85 (BP Location: Right Arm)   Pulse 86   Temp 97.7 F (36.5 C) (Oral)   Resp 16   LMP 03/13/2022 (Approximate)   SpO2 96%   Physical Exam Vitals and nursing note reviewed.  Constitutional:      General: She is not in acute distress.    Appearance: Normal appearance. She is not ill-appearing.  HENT:     Head: Normocephalic and atraumatic.  Eyes:     General: Lids are normal.        Right eye: No discharge.        Left eye: No discharge.     Extraocular Movements: Extraocular movements intact.     Conjunctiva/sclera: Conjunctivae normal.     Right eye: Right conjunctiva is not injected.     Left eye: Left conjunctiva is not injected.  Neck:     Trachea: Trachea and phonation normal.  Cardiovascular:     Rate and Rhythm: Normal rate and regular rhythm.     Pulses: Normal pulses.     Heart sounds: Normal heart sounds. No murmur heard.    No friction rub. No gallop.  Pulmonary:     Effort: Pulmonary effort is normal. No tachypnea, bradypnea, accessory muscle usage, prolonged expiration, respiratory distress or retractions.     Breath sounds: No stridor, decreased air movement or transmitted upper airway sounds. Examination of the right-lower field reveals rales. Examination of the left-lower field reveals rales. Rales present. No decreased breath sounds, wheezing or rhonchi.  Chest:     Chest wall: No tenderness.  Musculoskeletal:        General: Normal range of motion.     Cervical back: Normal range of motion and neck supple. Normal range of motion.  Lymphadenopathy:     Cervical: No cervical adenopathy.  Skin:    General: Skin is warm and dry.     Findings: No  erythema or rash.  Neurological:     General: No focal deficit present.     Mental Status: She is alert and oriented to person, place, and time.  Psychiatric:        Mood and Affect: Mood normal.        Behavior: Behavior normal.  Visual Acuity Right Eye Distance:   Left Eye Distance:   Bilateral Distance:    Right Eye Near:   Left Eye Near:    Bilateral Near:     UC Couse / Diagnostics / Procedures:     Radiology No results found.  Procedures Procedures (including critical care time) EKG  Pending results:  Labs Reviewed  POCT URINALYSIS DIP (MANUAL ENTRY) - Abnormal; Notable for the following components:      Result Value   Spec Grav, UA <=1.005 (*)    Blood, UA moderate (*)    Leukocytes, UA Trace (*)    All other components within normal limits  URINE CULTURE  POCT URINE PREGNANCY    Medications Ordered in UC: Medications - No data to display  UC Diagnoses / Final Clinical Impressions(s)   I have reviewed the triage vital signs and the nursing notes.  Pertinent labs & imaging results that were available during my care of the patient were reviewed by me and considered in my medical decision making (see chart for details).    Final diagnoses:  Pneumonia of both lower lobes due to infectious organism  Acute cystitis with hematuria   Physical exam findings concerning for unresolving pneumonia today.  Patient provided with prescription for levofloxacin which will also treat her for acute cystitis given abnormal findings on urine dip today.  Because patient recently completed a course of antibiotics prior to developing irritative urinary outflow tract symptoms, we will perform urine culture and advised patient of results, adjust antibiotic as needed.  Pyridium provided for symptomatic relief patient advised to finish the full 5 days of levofloxacin for pneumonia regardless of results of urine culture.  Patient plans to have recommended repeat chest x-ray ordered  by her primary care provider but states that they are unable to see her in that period of time she will return here to have her x-ray done.  ED Prescriptions     Medication Sig Dispense Auth. Provider   levofloxacin (LEVAQUIN) 750 MG tablet Take 1 tablet (750 mg total) by mouth daily for 5 days. 5 tablet Lynden Oxford Scales, PA-C   phenazopyridine (PYRIDIUM) 200 MG tablet Take 1 tablet (200 mg total) by mouth 3 (three) times daily. 6 tablet Lynden Oxford Scales, PA-C   albuterol (VENTOLIN HFA) 108 (90 Base) MCG/ACT inhaler Inhale 2 puffs into the lungs every 6 (six) hours as needed for wheezing or shortness of breath. 18 g Lynden Oxford Scales, PA-C      PDMP not reviewed this encounter.  Disposition Upon Discharge:  Condition: stable for discharge home  Patient presented with concern for an acute illness with associated systemic symptoms and significant discomfort requiring urgent management. In my opinion, this is a condition that a prudent lay person (someone who possesses an average knowledge of health and medicine) may potentially expect to result in complications if not addressed urgently such as respiratory distress, impairment of bodily function or dysfunction of bodily organs.   As such, the patient has been evaluated and assessed, work-up was performed and treatment was provided in alignment with urgent care protocols and evidence based medicine.  Patient/parent/caregiver has been advised that the patient may require follow up for further testing and/or treatment if the symptoms continue in spite of treatment, as clinically indicated and appropriate.  Routine symptom specific, illness specific and/or disease specific instructions were discussed with the patient and/or caregiver at length.  Prevention strategies for avoiding STD exposure were also discussed.  The patient will  follow up with their current PCP if and as advised. If the patient does not currently have a PCP we will  assist them in obtaining one.   The patient may need specialty follow up if the symptoms continue, in spite of conservative treatment and management, for further workup, evaluation, consultation and treatment as clinically indicated and appropriate.  Patient/parent/caregiver verbalized understanding and agreement of plan as discussed.  All questions were addressed during visit.  Please see discharge instructions below for further details of plan.  Discharge Instructions:   Discharge Instructions      To treat your unresolving pneumonia, please continue using albuterol, Mucinex and ipratropium nasal spray.  Please begin levofloxacin 750 mg, 1 tablet daily for the next 5 days.  This will treat any residual bacteria still in your lungs which is causing your pneumonia to linger as well as any bacteria in your bladder at this time which is certainly causing you to have a urinary tract infection.  I have also sent a prescription for Pyridium to your pharmacy that you can take for your comfort until the levofloxacin works its magic.  If you have not had relief of your urinary tract infection symptoms after taking levofloxacin for 5 days, I encourage you to return for repeat urinalysis so that we can perform a urine culture.  The urine culture is performed so that we can provide more specific antibiotic therapy, if this is needed.  We are happy to see you back in a few weeks for repeat chest x-ray but please know that if you prefer to have this performed by your primary care provider that is absolutely an option.      This office note has been dictated using Museum/gallery curator.  Unfortunately, this method of dictation can sometimes lead to typographical or grammatical errors.  I apologize for your inconvenience in advance if this occurs.  Please do not hesitate to reach out to me if clarification is needed.       Lynden Oxford Scales, PA-C 03/15/22 1311

## 2022-03-15 NOTE — ED Triage Notes (Signed)
The patient c/o bladder discomfort that began yesterday. She states she recently finished course of antibiotics. The patient reports she is still having congestion also.   Home interventions: inhaler, mucinex, decongestant, cough suppressant

## 2022-03-15 NOTE — Discharge Instructions (Signed)
To treat your unresolving pneumonia, please continue using albuterol, Mucinex and ipratropium nasal spray.  Please begin levofloxacin 750 mg, 1 tablet daily for the next 5 days.  This will treat any residual bacteria still in your lungs which is causing your pneumonia to linger as well as any bacteria in your bladder at this time which is certainly causing you to have a urinary tract infection.  I have also sent a prescription for Pyridium to your pharmacy that you can take for your comfort until the levofloxacin works its magic.  If you have not had relief of your urinary tract infection symptoms after taking levofloxacin for 5 days, I encourage you to return for repeat urinalysis so that we can perform a urine culture.  The urine culture is performed so that we can provide more specific antibiotic therapy, if this is needed.  We are happy to see you back in a few weeks for repeat chest x-ray but please know that if you prefer to have this performed by your primary care provider that is absolutely an option.

## 2022-03-18 LAB — URINE CULTURE: Culture: 40000 — AB

## 2022-04-01 ENCOUNTER — Ambulatory Visit (INDEPENDENT_AMBULATORY_CARE_PROVIDER_SITE_OTHER): Payer: Commercial Managed Care - PPO

## 2022-04-01 ENCOUNTER — Other Ambulatory Visit: Payer: Self-pay

## 2022-04-01 ENCOUNTER — Ambulatory Visit
Admission: EM | Admit: 2022-04-01 | Discharge: 2022-04-01 | Disposition: A | Discharge status: Home / Self Care | Payer: Commercial Managed Care - PPO | Attending: Urgent Care | Admitting: Urgent Care

## 2022-04-01 DIAGNOSIS — Z8709 Personal history of other diseases of the respiratory system: Secondary | ICD-10-CM | POA: Diagnosis present

## 2022-04-01 DIAGNOSIS — J019 Acute sinusitis, unspecified: Secondary | ICD-10-CM | POA: Insufficient documentation

## 2022-04-01 DIAGNOSIS — Z1152 Encounter for screening for COVID-19: Secondary | ICD-10-CM | POA: Diagnosis not present

## 2022-04-01 DIAGNOSIS — R059 Cough, unspecified: Secondary | ICD-10-CM

## 2022-04-01 DIAGNOSIS — J209 Acute bronchitis, unspecified: Secondary | ICD-10-CM | POA: Diagnosis present

## 2022-04-01 LAB — RESP PANEL BY RT-PCR (FLU A&B, COVID) ARPGX2
Influenza A by PCR: NEGATIVE
Influenza B by PCR: NEGATIVE
SARS Coronavirus 2 by RT PCR: NEGATIVE

## 2022-04-01 MED ORDER — PREDNISONE 50 MG PO TABS: 50.0000 mg | ORAL_TABLET | Freq: Every day | ORAL | 0 refills | Status: DC

## 2022-04-01 MED ORDER — PROMETHAZINE-DM 6.25-15 MG/5ML PO SYRP: 5.0000 mL | ORAL_SOLUTION | Freq: Every evening | ORAL | 0 refills | Status: DC | PRN

## 2022-04-01 MED ORDER — LEVOCETIRIZINE DIHYDROCHLORIDE 5 MG PO TABS: 5.0000 mg | ORAL_TABLET | Freq: Every evening | ORAL | 0 refills | Status: AC

## 2022-04-01 NOTE — ED Provider Notes (Signed)
[facility]  Note:  This document was prepared using Systems analyst and may include unintentional dictation errors.  MRN: [id number] DOB: 06/06/1985  Subjective:   Kimberly Delgado is a 36 y.o. female presenting for 4-day history of acute onset sinus congestion, sinus pressure, sinus headaches, hoarseness and a recurrent cough.  The cough is eliciting right-sided lower rib pain.  Patient was seen and treated 4 weeks ago for pneumonia.  She initially underwent a course of Augmentin and IM triamcinolone.  This treatment failed and therefore she underwent a course of levofloxacin.  She was supposed to have a repeat chest x-ray this week.  She does have a history of exercise-induced asthma but has not bothered her for years.  She is not a smoker.  No vaping.  No current facility-administered medications for this encounter.  Current Outpatient Medications:    albuterol (VENTOLIN HFA) 108 (90 Base) MCG/ACT inhaler, Inhale 2 puffs into the lungs every 6 (six) hours as needed for wheezing or shortness of breath., Disp: 18 g, Rfl: 2   phenazopyridine (PYRIDIUM) 200 MG tablet, Take 1 tablet (200 mg total) by mouth 3 (three) times daily., Disp: 6 tablet, Rfl: 0   Allergies  Allergen Reactions   Latex Rash    Past Medical History:  Diagnosis Date   AMA (advanced maternal age) multigravida 35+    Anxiety    Depression    Exercise-induced asthma    per pt no inhaler   Hernia, umbilical    History of pyelonephritis    Incompetent cervix    12-23-2018  first trimester   PCOS (polycystic ovarian syndrome)    Seasonal allergies    Wears contact lenses      Past Surgical History:  Procedure Laterality Date   ABDOMINAL CERCLAGE N/A 12/25/2018   Procedure: TRANS CERCLAGE ABDOMINAL;  Surgeon: Governor Specking, MD;  Location: [facility];  Service: Gynecology;  Laterality: N/A;   CERVICAL CERCLAGE N/A 02/17/2015   Procedure:  CERCLAGE CERVICAL;  Surgeon: Servando Salina, MD;  Location: [facility];  Service: Gynecology;  Laterality: N/A;  EDD: 08/16/15   CESAREAN SECTION WITH BILATERAL TUBAL LIGATION Bilateral 07/09/2019   Procedure: Primary CESAREAN SECTION WITH BILATERAL TUBAL LIGATION;  Surgeon: Servando Salina, MD;  Location: [facility];  Service: Obstetrics;  Laterality: Bilateral;  EDD: 07/14/19 Tracey RNFA   WISDOM TOOTH EXTRACTION      Family History  Problem Relation Age of Onset   Diabetes Other    Diabetes Mother    Diabetes Maternal Grandfather    Asthma Maternal Grandfather     Social History   Tobacco Use   Smoking status: Never   Smokeless tobacco: Never  Vaping Use   Vaping Use: Never used  Substance Use Topics   Alcohol use: Not Currently   Drug use: Never    ROS   Objective:   Vitals: BP 117/87 (BP Location: Right Arm)   Pulse 89   Temp 98.2 F (36.8 C) (Oral)   Resp 18   Ht '5\' 6"'$  (1.676 m)   LMP 03/13/2022   SpO2 96%   BMI 28.49 kg/m   Physical Exam Constitutional:      General: She is not in acute distress.    Appearance: Normal appearance. She is well-developed. She is not ill-appearing, toxic-appearing or diaphoretic.  HENT:     Head: Normocephalic and atraumatic.     Right Ear: Tympanic membrane, ear canal and external ear normal. There  is no impacted cerumen.     Left Ear: Tympanic membrane, ear canal and external ear normal. There is no impacted cerumen.     Nose: Congestion present. No rhinorrhea.     Mouth/Throat:     Mouth: Mucous membranes are moist.     Pharynx: Posterior oropharyngeal erythema (with associated post-nasal drainage overlying pharynx) present. No oropharyngeal exudate.  Eyes:     General: No scleral icterus.       Right eye: No discharge.        Left eye: No discharge.     Extraocular Movements: Extraocular movements intact.  Cardiovascular:     Rate and Rhythm: Normal rate and regular rhythm.     Heart sounds: Normal heart sounds.  No murmur heard.    No friction rub. No gallop.  Pulmonary:     Effort: Pulmonary effort is normal. No respiratory distress.     Breath sounds: No stridor. No wheezing, rhonchi or rales.  Chest:     Chest wall: No tenderness.  Skin:    General: Skin is warm and dry.  Neurological:     General: No focal deficit present.     Mental Status: She is alert and oriented to person, place, and time.  Psychiatric:        Mood and Affect: Mood normal.        Behavior: Behavior normal.     DG Chest 2 View  Result Date: 04/01/2022 CLINICAL DATA:  Cough.  Follow-up pneumonia. EXAM: CHEST - 2 VIEW COMPARISON:  03/06/2022 FINDINGS: Normal heart size. No pleural effusion or edema. Mild diffuse central airway thickening. No airspace opacities. Visualized osseous structures are unremarkable. IMPRESSION: Mild diffuse central airway thickening. Interval clearing of left lower lobe pneumonia. Electronically Signed   By: Kerby Moors M.D.   On: 04/01/2022 16:05     Assessment and Plan :   PDMP not reviewed this encounter.  1. Acute bronchitis, unspecified organism   2. Acute sinusitis, recurrence not specified, unspecified location   3. History of asthma     Recommended holding off on antibiotic use for now.  She is in agreement.  We will be using an oral prednisone course.  Recommended supportive care.  Hold the nasal sprays.  Advised that if she continues to have symptoms that she should recheck this coming Friday. Counseled patient on potential for adverse effects with medications prescribed/recommended today, ER and return-to-clinic precautions discussed, patient verbalized understanding.    Jaynee Eagles, PA-C 04/01/22 1720

## 2022-04-01 NOTE — ED Triage Notes (Signed)
Pt states she was dx with PNA ~4 weeks ago-c/o cont'd cough and flu like sx-states she was advised to return for repeat CXR-NAD-steady gait

## 2022-04-03 ENCOUNTER — Other Ambulatory Visit: Payer: Self-pay | Admitting: Obstetrics and Gynecology

## 2022-04-26 ENCOUNTER — Other Ambulatory Visit: Payer: Self-pay

## 2022-04-26 MED ORDER — COVID-19 MRNA VAC-TRIS(PFIZER) 30 MCG/0.3ML IM SUSY
PREFILLED_SYRINGE | INTRAMUSCULAR | 0 refills | Status: DC
  Filled 2022-04-29: qty 0.3, 1d supply, fill #0

## 2022-04-29 ENCOUNTER — Other Ambulatory Visit: Payer: Self-pay

## 2022-04-30 ENCOUNTER — Encounter (HOSPITAL_BASED_OUTPATIENT_CLINIC_OR_DEPARTMENT_OTHER): Payer: Self-pay | Admitting: Obstetrics and Gynecology

## 2022-04-30 NOTE — Progress Notes (Signed)
Spoke w/ via phone for pre-op interview--- [name] Lab needs dos----  CBC and T&S per surgeon             Lab results------ COVID test -----patient states asymptomatic no test needed Arrive at -------1030 NPO after MN NO Solid Food.  Clear liquids from MN until---0930 Med rec completed Medications to take morning of surgery -----NONE Diabetic medication ----- Patient instructed no nail polish to be worn day of surgery Patient instructed to bring photo id and insurance card day of surgery Patient aware to have Driver (ride ) / caregiver  Husband Kimberly Delgado   for 24 hours after surgery  Patient Special Instructions ----- Pre-Op special Istructions ----- Patient verbalized understanding of instructions that were given at this phone interview. Patient denies shortness of breath, chest pain, fever, cough at this phone interview.

## 2022-05-03 ENCOUNTER — Other Ambulatory Visit: Payer: Self-pay

## 2022-05-03 ENCOUNTER — Encounter (HOSPITAL_BASED_OUTPATIENT_CLINIC_OR_DEPARTMENT_OTHER): Payer: Self-pay | Admitting: Obstetrics and Gynecology

## 2022-05-03 ENCOUNTER — Encounter (HOSPITAL_BASED_OUTPATIENT_CLINIC_OR_DEPARTMENT_OTHER)
Admission: RE | Disposition: A | Discharge status: Home / Self Care | Payer: Self-pay | Source: Ambulatory Visit | Attending: Obstetrics and Gynecology

## 2022-05-03 ENCOUNTER — Ambulatory Visit (HOSPITAL_BASED_OUTPATIENT_CLINIC_OR_DEPARTMENT_OTHER): Payer: Commercial Managed Care - PPO | Admitting: Certified Registered Nurse Anesthetist

## 2022-05-03 ENCOUNTER — Ambulatory Visit (HOSPITAL_BASED_OUTPATIENT_CLINIC_OR_DEPARTMENT_OTHER)
Admission: RE | Admit: 2022-05-03 | Discharge: 2022-05-03 | Disposition: A | Discharge status: Home / Self Care | Payer: Commercial Managed Care - PPO | Source: Ambulatory Visit | Attending: Obstetrics and Gynecology | Admitting: Obstetrics and Gynecology

## 2022-05-03 DIAGNOSIS — J45909 Unspecified asthma, uncomplicated: Secondary | ICD-10-CM | POA: Diagnosis not present

## 2022-05-03 DIAGNOSIS — E282 Polycystic ovarian syndrome: Secondary | ICD-10-CM | POA: Diagnosis not present

## 2022-05-03 DIAGNOSIS — N840 Polyp of corpus uteri: Secondary | ICD-10-CM | POA: Diagnosis not present

## 2022-05-03 DIAGNOSIS — N920 Excessive and frequent menstruation with regular cycle: Secondary | ICD-10-CM | POA: Diagnosis present

## 2022-05-03 HISTORY — PX: DILATATION & CURETTAGE/HYSTEROSCOPY WITH MYOSURE: SHX6511

## 2022-05-03 LAB — CBC
HCT: 39.2 % (ref 36.0–46.0)
Hemoglobin: 12.6 g/dL (ref 12.0–15.0)
MCH: 27.6 pg (ref 26.0–34.0)
MCHC: 32.1 g/dL (ref 30.0–36.0)
MCV: 86 fL (ref 80.0–100.0)
Platelets: 259 10*3/uL (ref 150–400)
RBC: 4.56 MIL/uL (ref 3.87–5.11)
RDW: 13.7 % (ref 11.5–15.5)
WBC: 9 10*3/uL (ref 4.0–10.5)
nRBC: 0 % (ref 0.0–0.2)

## 2022-05-03 LAB — TYPE AND SCREEN
ABO/RH(D): O NEG
Antibody Screen: NEGATIVE

## 2022-05-03 LAB — POCT PREGNANCY, URINE: Preg Test, Ur: NEGATIVE

## 2022-05-03 SURGERY — DILATATION & CURETTAGE/HYSTEROSCOPY WITH MYOSURE
Anesthesia: General | Site: Uterus

## 2022-05-03 MED ORDER — KETOROLAC TROMETHAMINE 30 MG/ML IJ SOLN
INTRAMUSCULAR | Status: AC
  Filled 2022-05-03: qty 1

## 2022-05-03 MED ORDER — LACTATED RINGERS IV SOLN: INTRAVENOUS | Status: DC

## 2022-05-03 MED ORDER — ACETAMINOPHEN 500 MG PO TABS
ORAL_TABLET | ORAL | Status: AC
  Filled 2022-05-03: qty 2

## 2022-05-03 MED ORDER — FENTANYL CITRATE (PF) 100 MCG/2ML IJ SOLN
INTRAMUSCULAR | Status: DC | PRN
  Administered 2022-05-03: 25 ug via INTRAVENOUS
  Administered 2022-05-03: 50 ug via INTRAVENOUS
  Administered 2022-05-03: 25 ug via INTRAVENOUS

## 2022-05-03 MED ORDER — KETOROLAC TROMETHAMINE 30 MG/ML IJ SOLN
INTRAMUSCULAR | Status: DC | PRN
  Administered 2022-05-03: 30 mg via INTRAVENOUS

## 2022-05-03 MED ORDER — AMISULPRIDE (ANTIEMETIC) 5 MG/2ML IV SOLN: 10.0000 mg | Freq: Once | INTRAVENOUS | Status: DC | PRN

## 2022-05-03 MED ORDER — LIDOCAINE HCL (PF) 2 % IJ SOLN
INTRAMUSCULAR | Status: AC
  Filled 2022-05-03: qty 5

## 2022-05-03 MED ORDER — EPHEDRINE 5 MG/ML INJ
INTRAVENOUS | Status: AC
  Filled 2022-05-03: qty 5

## 2022-05-03 MED ORDER — ONDANSETRON HCL 4 MG/2ML IJ SOLN
INTRAMUSCULAR | Status: AC
  Filled 2022-05-03: qty 2

## 2022-05-03 MED ORDER — SCOPOLAMINE 1 MG/3DAYS TD PT72
1.0000 | MEDICATED_PATCH | Freq: Once | TRANSDERMAL | Status: DC
  Administered 2022-05-03: 1.5 mg via TRANSDERMAL

## 2022-05-03 MED ORDER — PROPOFOL 10 MG/ML IV BOLUS
INTRAVENOUS | Status: DC | PRN
  Administered 2022-05-03: 150 mg via INTRAVENOUS

## 2022-05-03 MED ORDER — ACETAMINOPHEN 500 MG PO TABS
1000.0000 mg | ORAL_TABLET | Freq: Once | ORAL | Status: AC
  Administered 2022-05-03: 1000 mg via ORAL

## 2022-05-03 MED ORDER — MIDAZOLAM HCL 2 MG/2ML IJ SOLN
INTRAMUSCULAR | Status: AC
  Filled 2022-05-03: qty 2

## 2022-05-03 MED ORDER — PROPOFOL 10 MG/ML IV BOLUS
INTRAVENOUS | Status: AC
  Filled 2022-05-03: qty 20

## 2022-05-03 MED ORDER — SCOPOLAMINE 1 MG/3DAYS TD PT72
MEDICATED_PATCH | TRANSDERMAL | Status: AC
  Filled 2022-05-03: qty 1

## 2022-05-03 MED ORDER — SODIUM CHLORIDE 0.9 % IR SOLN
Status: DC | PRN
  Administered 2022-05-03: 3000 mL

## 2022-05-03 MED ORDER — DEXAMETHASONE SODIUM PHOSPHATE 4 MG/ML IJ SOLN
INTRAMUSCULAR | Status: DC | PRN
  Administered 2022-05-03: 5 mg via INTRAVENOUS

## 2022-05-03 MED ORDER — LIDOCAINE HCL (CARDIAC) PF 100 MG/5ML IV SOSY
PREFILLED_SYRINGE | INTRAVENOUS | Status: DC | PRN
  Administered 2022-05-03: 100 mg via INTRAVENOUS

## 2022-05-03 MED ORDER — MIDAZOLAM HCL 5 MG/5ML IJ SOLN
INTRAMUSCULAR | Status: DC | PRN
  Administered 2022-05-03: 1 mg via INTRAVENOUS

## 2022-05-03 MED ORDER — ONDANSETRON HCL 4 MG/2ML IJ SOLN
INTRAMUSCULAR | Status: DC | PRN
  Administered 2022-05-03: 4 mg via INTRAVENOUS

## 2022-05-03 MED ORDER — FENTANYL CITRATE (PF) 100 MCG/2ML IJ SOLN: 25.0000 ug | INTRAMUSCULAR | Status: DC | PRN

## 2022-05-03 MED ORDER — IBUPROFEN 800 MG PO TABS: 800.0000 mg | ORAL_TABLET | Freq: Three times a day (TID) | ORAL | 11 refills | Status: DC | PRN

## 2022-05-03 MED ORDER — DEXAMETHASONE SODIUM PHOSPHATE 10 MG/ML IJ SOLN
INTRAMUSCULAR | Status: AC
  Filled 2022-05-03: qty 1

## 2022-05-03 MED ORDER — FENTANYL CITRATE (PF) 100 MCG/2ML IJ SOLN
INTRAMUSCULAR | Status: AC
  Filled 2022-05-03: qty 2

## 2022-05-03 MED ORDER — OXYCODONE HCL 5 MG PO TABS: 5.0000 mg | ORAL_TABLET | Freq: Once | ORAL | Status: DC | PRN

## 2022-05-03 MED ORDER — EPHEDRINE SULFATE (PRESSORS) 50 MG/ML IJ SOLN
INTRAMUSCULAR | Status: DC | PRN
  Administered 2022-05-03 (×2): 5 mg via INTRAVENOUS

## 2022-05-03 MED ORDER — POVIDONE-IODINE 10 % EX SWAB: 2.0000 | Freq: Once | CUTANEOUS | Status: DC

## 2022-05-03 MED ORDER — OXYCODONE HCL 5 MG/5ML PO SOLN: 5.0000 mg | Freq: Once | ORAL | Status: DC | PRN

## 2022-05-03 SURGICAL SUPPLY — 22 items
ABLATOR SURESOUND NOVASURE (ABLATOR) IMPLANT
CATH ROBINSON RED A/P 16FR (CATHETERS) IMPLANT
DEVICE MYOSURE LITE (MISCELLANEOUS) IMPLANT
DEVICE MYOSURE REACH (MISCELLANEOUS) IMPLANT
DILATOR CANAL MILEX (MISCELLANEOUS) IMPLANT
DRSG TELFA 3X8 NADH STRL (GAUZE/BANDAGES/DRESSINGS) ×1 IMPLANT
GAUZE 4X4 16PLY ~~LOC~~+RFID DBL (SPONGE) ×1 IMPLANT
GLOVE BIOGEL PI IND STRL 7.0 (GLOVE) ×1 IMPLANT
GLOVE ECLIPSE 6.5 STRL STRAW (GLOVE) ×1 IMPLANT
GOWN STRL REUS W/TWL LRG LVL3 (GOWN DISPOSABLE) ×1 IMPLANT
IV NS IRRIG 3000ML ARTHROMATIC (IV SOLUTION) ×1 IMPLANT
KIT PROCEDURE FLUENT (KITS) ×1 IMPLANT
KIT TURNOVER CYSTO (KITS) ×1 IMPLANT
MYOSURE XL FIBROID (MISCELLANEOUS)
PACK VAGINAL MINOR WOMEN LF (CUSTOM PROCEDURE TRAY) ×1 IMPLANT
PAD OB MATERNITY 4.3X12.25 (PERSONAL CARE ITEMS) ×1 IMPLANT
PAD PREP 24X48 CUFFED NSTRL (MISCELLANEOUS) ×1 IMPLANT
SEAL CERVICAL OMNI LOK (ABLATOR) IMPLANT
SEAL ROD LENS SCOPE MYOSURE (ABLATOR) ×1 IMPLANT
SYSTEM TISS REMOVAL MYOSURE XL (MISCELLANEOUS) IMPLANT
TOWEL OR 17X26 10 PK STRL BLUE (TOWEL DISPOSABLE) ×1 IMPLANT
WATER STERILE IRR 500ML POUR (IV SOLUTION) ×1 IMPLANT

## 2022-05-03 NOTE — Discharge Instructions (Addendum)
CALL  IF TEMP>100.4, NOTHING PER VAGINA X 2 WK, CALL IF SOAKING A MAXI  PAD EVERY HOUR OR MORE FREQUENTLY    No acetaminophen/Tylenol until after 7:00 pm today if needed.  No ibuprofen, Advil, Aleve, Motrin, ketorolac, meloxicam, naproxen, or other NSAIDS until after 8:30 pm today if needed.     Post Anesthesia Home Care Instructions  Activity: Get plenty of rest for the remainder of the day. A responsible individual must stay with you for 24 hours following the procedure.  For the next 24 hours, DO NOT: -Drive a car -Paediatric nurse -Drink alcoholic beverages -Take any medication unless instructed by your physician -Make any legal decisions or sign important papers.  Meals: Start with liquid foods such as gelatin or soup. Progress to regular foods as tolerated. Avoid greasy, spicy, heavy foods. If nausea and/or vomiting occur, drink only clear liquids until the nausea and/or vomiting subsides. Call your physician if vomiting continues.  Special Instructions/Symptoms: Your throat may feel dry or sore from the anesthesia or the breathing tube placed in your throat during surgery. If this causes discomfort, gargle with warm salt water. The discomfort should disappear within 24 hours.  If you had a scopolamine patch placed behind your ear for the management of post- operative nausea and/or vomiting:  1. The medication in the patch is effective for 72 hours, after which it should be removed.  Wrap patch in a tissue and discard in the trash. Wash hands thoroughly with soap and water. 2. You may remove the patch earlier than 72 hours if you experience unpleasant side effects which may include dry mouth, dizziness or visual disturbances. 3. Avoid touching the patch. Wash your hands with soap and water after contact with the patch.

## 2022-05-03 NOTE — Anesthesia Preprocedure Evaluation (Addendum)
Anesthesia Evaluation  Patient identified by MRN, date of birth, ID band Patient awake    Reviewed: Allergy & Precautions, NPO status , Patient's Chart, lab work & pertinent test results  History of Anesthesia Complications Negative for: history of anesthetic complications  Airway Mallampati: II  TM Distance: >3 FB Neck ROM: Full    Dental no notable dental hx.    Pulmonary asthma    Pulmonary exam normal        Cardiovascular Normal cardiovascular exam     Neuro/Psych   Anxiety Depression       GI/Hepatic   Endo/Other  PCOS  Renal/GU      Musculoskeletal   Abdominal   Peds  Hematology   Anesthesia Other Findings   Reproductive/Obstetrics endometrial polyp                              Anesthesia Physical Anesthesia Plan  ASA: 2  Anesthesia Plan: General   Post-op Pain Management: Tylenol PO (pre-op)* and Toradol IV (intra-op)*   Induction: Intravenous  PONV Risk Score and Plan: 3 and Midazolam, Scopolamine patch - Pre-op, Treatment may vary due to age or medical condition, Dexamethasone and Ondansetron  Airway Management Planned: LMA  Additional Equipment: None  Intra-op Plan:   Post-operative Plan: Extubation in OR  Informed Consent: I have reviewed the patients History and Physical, chart, labs and discussed the procedure including the risks, benefits and alternatives for the proposed anesthesia with the patient or authorized representative who has indicated his/her understanding and acceptance.     Dental advisory given  Plan Discussed with: CRNA  Anesthesia Plan Comments:         Anesthesia Quick Evaluation

## 2022-05-03 NOTE — Interval H&P Note (Signed)
History and Physical Interval Note:  05/03/2022 11:54 AM  Kimberly Delgado  has presented today for surgery, with the diagnosis of diagnostic novasure endometrial ablation, resection of endometrial  polyp using myosure.  The various methods of treatment have been discussed with the patient and family. After consideration of risks, benefits and other options for treatment, the patient has consented to  procedure: diagnostic hysteroscopy, hysteroscopic resection of endometrial polyp, novasure endometrial ablation as a surgical intervention.  The patient's history has been reviewed, patient examined, no change in status, stable for surgery.  I have reviewed the patient's chart and labs.  Questions were answered to the patient's satisfaction.     Enda Santo A Deitrich Steve

## 2022-05-03 NOTE — Transfer of Care (Signed)
Immediate Anesthesia Transfer of Care Note  Patient: Kimberly Delgado  Procedure(s) Performed: DILATATION & CURETTAGE/HYSTEROSCOPY WITH MYOSURE/NOVASURE ABLATION (Uterus)  Patient Location: PACU  Anesthesia Type:General  Level of Consciousness: awake, alert , and oriented  Airway & Oxygen Therapy: Patient Spontanous Breathing and Patient connected to face mask oxygen  Post-op Assessment: Report given to RN and Post -op Vital signs reviewed and stable  Post vital signs: Reviewed and stable  Last Vitals:  Vitals Value Taken Time  BP    Temp    Pulse 87 05/03/22 1250  Resp 20 05/03/22 1250  SpO2 100 % 05/03/22 1250  Vitals shown include unvalidated device data.  Last Pain:  Vitals:   05/03/22 1104  TempSrc: Oral  PainSc: 0-No pain      Patients Stated Pain Goal: 4 (14/27/67 0110)  Complications: No notable events documented.

## 2022-05-03 NOTE — H&P (Signed)
Kimberly Delgado is an 36 y.o. female. [id number] MWF hx TL present for surgical management of menorrhagia. Pt is scheduled for dx hysteroscopy, Novasure endometrial ablation. Ebx was benign path.  Pertinent Gynecological History: Menses:  menorrhagia Bleeding: menorrhagia Contraception: tubal ligation DES exposure: denies Blood transfusions: none Sexually transmitted diseases: no past history Previous GYN Procedures:  TL   Last mammogram:  n/a  Date: n/a Last pap: normal Date: 2023 OB History: G4, P3   Menstrual History: Menarche age: n/a Patient's last menstrual period was 04/21/2022 (approximate).    Past Medical History:  Diagnosis Date   AMA (advanced maternal age) multigravida 35+    Anxiety    Depression    Exercise-induced asthma    per pt no inhaler   Hernia, umbilical    History of pyelonephritis    Incompetent cervix    12-23-2018  first trimester   PCOS (polycystic ovarian syndrome)    Seasonal allergies    Wears contact lenses     Past Surgical History:  Procedure Laterality Date   ABDOMINAL CERCLAGE N/A 12/25/2018   Procedure: TRANS CERCLAGE ABDOMINAL;  Surgeon: Governor Specking, MD;  Location: [facility];  Service: Gynecology;  Laterality: N/A;   CERVICAL CERCLAGE N/A 02/17/2015   Procedure: CERCLAGE CERVICAL;  Surgeon: Servando Salina, MD;  Location: [facility];  Service: Gynecology;  Laterality: N/A;  EDD: 08/16/15   CESAREAN SECTION WITH BILATERAL TUBAL LIGATION Bilateral 07/09/2019   Procedure: Primary CESAREAN SECTION WITH BILATERAL TUBAL LIGATION;  Surgeon: Servando Salina, MD;  Location: [facility];  Service: Obstetrics;  Laterality: Bilateral;  EDD: 07/14/19 Tracey RNFA   WISDOM TOOTH EXTRACTION      Family History  Problem Relation Age of Onset   Diabetes Other    Diabetes Mother    Diabetes Maternal Grandfather    Asthma Maternal Grandfather     Social History:  reports that she has never smoked. She has never used  smokeless tobacco. She reports that she does not currently use alcohol. She reports that she does not use drugs.  Allergies:  Allergies  Allergen Reactions   Latex Rash    No medications prior to admission.    Review of Systems  All other systems reviewed and are negative.   Height '5\' 6"'$  (1.676 m), weight 65.8 kg, last menstrual period 04/21/2022, unknown if currently breastfeeding. Physical Exam Constitutional:      Appearance: Normal appearance.  HENT:     Head: Atraumatic.     Mouth/Throat:     Mouth: Mucous membranes are moist.  Eyes:     Extraocular Movements: Extraocular movements intact.  Cardiovascular:     Rate and Rhythm: Regular rhythm.     Heart sounds: Normal heart sounds.  Pulmonary:     Breath sounds: Normal breath sounds.  Abdominal:     General: Abdomen is flat.  Genitourinary:    General: Normal vulva.     Comments: Vagina nl  Cervix parous Uterus anterior sl enlarged And no palp mass Musculoskeletal:     Cervical back: Neck supple.  Neurological:     General: No focal deficit present.     Mental Status: She is alert and oriented to person, place, and time.  Psychiatric:        Mood and Affect: Mood normal.        Behavior: Behavior normal.     No results found for this or any previous visit (from the past 24 hour(s)).  No results found.  Assessment/Plan:  Menorrhagia with regular cycles P) dx hysteroscopy, Novasure endometrial ablation. Procedure explained. Risk of surgery reviewed including infection, bleeding, injury to surrounding organ structures, thermal injury, fluid overload and its mgmt, uterine perforation( 05/998) and its risk, 90% reduction in flow. All ? answered  Ezma Rehm A Eden Rho 05/03/2022, 4:10 AM

## 2022-05-03 NOTE — Op Note (Unsigned)
Kimberly Delgado, Kimberly Delgado MEDICAL RECORD NO: [id number] ACCOUNT NO: [id number] DATE OF BIRTH: 12-10-1985 FACILITY: [facility] LOCATION: [facility] PHYSICIAN: Glorie Dowlen A. Garwin Brothers, MD  Operative Report   DATE OF PROCEDURE: 05/03/2022  PREOPERATIVE DIAGNOSES:  Menorrhagia with regular cycles, endometrial polyp.  PROCEDURE:  Diagnostic hysteroscopy, hysteroscopic resection of endometrial polyp, NovaSure endometrial ablation.  POSTOPERATIVE DIAGNOSES:  Endometrial polyps, menorrhagia with regular cycles.  ANESTHESIA:  General.  SURGEON:  Afton Mikelson A. Garwin Brothers, MD.  ASSISTANT:  None.  DESCRIPTION OF PROCEDURE:  Under adequate general anesthesia, the patient was placed in the dorsal lithotomy position.  She was sterilely prepped and draped in the usual fashion.  The patient had voided prior to entering the room and therefore was not  catheterized.  Examination under anesthesia revealed anteverted uterus, no adnexal masses could be appreciated.  Bivalve speculum was placed in the vagina.  A single-tooth tenaculum was placed on the lip of the cervix.  Uterus sounded to 10 cm.  The  endocervical canal, sounded to 3.5 cm, making the uterine length of 6.5 cm.  The cervix easily accepted a #17 Pratt dilator.  A diagnostic hysteroscope was introduced into the uterine cavity.  Both tubal ostia were seen, polypoid lesions were noted in  the anterior and posterior wall of the uterus using a Lite resectoscope the endometrial polypoid lesions were resected.  The resectoscope was removed.  The NovaSure endometrial ablation apparatus was then inserted and width of 2.5 was noted. With a power of 89 watts,1 minute 55 seconds of endometrium ablation occurred.  The endometrial ablation apparatus was removed.  The resectoscope was reinserted.  The cavity showed good endometrial ablation throughout. The procedure was then terminated by removing  all instruments from the vagina.  Specimen labeled endometrial  curetting and polyps were sent to pathology.  Fluid deficit was 160 ml.  Complication was none.  ESTIMATED BLOOD LOSS:  Less than 5 mL.  COUNTS:  Sponge and instrument counts x2 was correct.  COMPLICATIONS:  None.  The patient tolerated the procedure well, was transferred to recovery room in stable condition.   PUS D: 05/03/2022 1:55:15 pm T: 05/03/2022 6:36:00 pm  JOB: [id number]/ [id number]

## 2022-05-03 NOTE — Anesthesia Postprocedure Evaluation (Signed)
Anesthesia Post Note  Patient: Kimberly Delgado  Procedure(s) Performed: DILATATION & CURETTAGE/HYSTEROSCOPY WITH MYOSURE/NOVASURE ABLATION (Uterus)     Patient location during evaluation: PACU Anesthesia Type: General Level of consciousness: awake and alert Pain management: pain level controlled Vital Signs Assessment: post-procedure vital signs reviewed and stable Respiratory status: spontaneous breathing, nonlabored ventilation and respiratory function stable Cardiovascular status: blood pressure returned to baseline Postop Assessment: no apparent nausea or vomiting Anesthetic complications: no   No notable events documented.  Last Vitals:  Vitals:   05/03/22 1300 05/03/22 1315  BP: 112/75 117/81  Pulse: 67 (!) 57  Resp: 18 19  Temp:    SpO2: 100% 100%    Last Pain:  Vitals:   05/03/22 1315  TempSrc:   PainSc: 0-No pain                 Marthenia Rolling

## 2022-05-03 NOTE — Anesthesia Procedure Notes (Signed)
Procedure Name: LMA Insertion Date/Time: 05/03/2022 12:10 PM  Performed by: Bufford Spikes, CRNAPre-anesthesia Checklist: Patient identified, Emergency Drugs available, Suction available and Patient being monitored Patient Re-evaluated:Patient Re-evaluated prior to induction Oxygen Delivery Method: Circle system utilized Preoxygenation: Pre-oxygenation with 100% oxygen Induction Type: IV induction Ventilation: Mask ventilation without difficulty LMA: LMA inserted LMA Size: 4.0 Number of attempts: 1 Placement Confirmation: positive ETCO2 Tube secured with: Tape Dental Injury: Teeth and Oropharynx as per pre-operative assessment

## 2022-05-06 ENCOUNTER — Encounter (HOSPITAL_BASED_OUTPATIENT_CLINIC_OR_DEPARTMENT_OTHER): Payer: Self-pay | Admitting: Obstetrics and Gynecology

## 2022-05-06 LAB — SURGICAL PATHOLOGY

## 2022-05-28 ENCOUNTER — Telehealth: Payer: Commercial Managed Care - PPO | Admitting: Family Medicine

## 2022-05-28 ENCOUNTER — Ambulatory Visit: Payer: Self-pay

## 2022-05-28 DIAGNOSIS — R3989 Other symptoms and signs involving the genitourinary system: Secondary | ICD-10-CM | POA: Diagnosis not present

## 2022-05-28 MED ORDER — NITROFURANTOIN MONOHYD MACRO 100 MG PO CAPS: 100.0000 mg | ORAL_CAPSULE | Freq: Two times a day (BID) | ORAL | 0 refills | Status: AC

## 2022-05-28 NOTE — Progress Notes (Signed)

## 2022-08-01 ENCOUNTER — Other Ambulatory Visit: Payer: Self-pay | Admitting: Internal Medicine

## 2022-08-01 DIAGNOSIS — N63 Unspecified lump in unspecified breast: Secondary | ICD-10-CM

## 2022-08-15 ENCOUNTER — Other Ambulatory Visit: Payer: Self-pay | Admitting: Internal Medicine

## 2022-08-15 ENCOUNTER — Ambulatory Visit
Admission: RE | Admit: 2022-08-15 | Discharge: 2022-08-15 | Disposition: A | Discharge status: Home / Self Care | Payer: Commercial Managed Care - PPO | Source: Ambulatory Visit | Attending: Internal Medicine | Admitting: Internal Medicine

## 2022-08-15 DIAGNOSIS — N63 Unspecified lump in unspecified breast: Secondary | ICD-10-CM

## 2022-08-15 DIAGNOSIS — N6310 Unspecified lump in the right breast, unspecified quadrant: Secondary | ICD-10-CM

## 2023-02-13 ENCOUNTER — Other Ambulatory Visit: Payer: Commercial Managed Care - PPO

## 2023-02-19 ENCOUNTER — Other Ambulatory Visit: Payer: Commercial Managed Care - PPO

## 2023-02-21 ENCOUNTER — Other Ambulatory Visit: Payer: Commercial Managed Care - PPO

## 2023-03-11 ENCOUNTER — Other Ambulatory Visit: Payer: Self-pay | Admitting: Internal Medicine

## 2023-03-11 ENCOUNTER — Ambulatory Visit
Admission: RE | Admit: 2023-03-11 | Discharge: 2023-03-11 | Disposition: A | Discharge status: Home / Self Care | Payer: Commercial Managed Care - PPO | Source: Ambulatory Visit | Attending: Internal Medicine | Admitting: Internal Medicine

## 2023-03-11 DIAGNOSIS — N63 Unspecified lump in unspecified breast: Secondary | ICD-10-CM

## 2023-03-11 DIAGNOSIS — N6310 Unspecified lump in the right breast, unspecified quadrant: Secondary | ICD-10-CM

## 2023-03-13 ENCOUNTER — Encounter: Payer: Self-pay | Admitting: Internal Medicine

## 2023-03-24 ENCOUNTER — Ambulatory Visit: Payer: Self-pay

## 2023-06-09 ENCOUNTER — Ambulatory Visit: Payer: Self-pay | Admitting: Surgery

## 2023-06-09 NOTE — H&P (Signed)
 Subjective   Chief Complaint: Umbilical Hernia     History of Present Illness: Kimberly Delgado is a 38 y.o. female who is seen today as an office consultation at the request of Dr. Varadarajan for evaluation of Umbilical Hernia .   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  In 2021, she had a C-section.  Shortly after that she began noticing a small bulge at her umbilicus.  This has become larger and is causing more discomfort.  It remains reducible.  She denies any obstructive symptoms.  She presents now to discuss hernia repair.   Review of Systems: A complete review of systems was obtained from the patient.  I have reviewed this information and discussed as appropriate with the patient.  See HPI as well for other ROS.  Review of Systems  Constitutional: Negative.   HENT: Negative.    Eyes: Negative.   Respiratory: Negative.    Cardiovascular: Negative.   Gastrointestinal: Negative.   Genitourinary: Negative.   Musculoskeletal: Negative.   Skin: Negative.   Neurological: Negative.   Endo/Heme/Allergies: Negative.   Psychiatric/Behavioral: Negative.        Medical History: Past Medical History:  Diagnosis Date   Anxiety     Patient Active Problem List  Diagnosis   Umbilical hernia without obstruction or gangrene    History reviewed. No pertinent surgical history.   No Known Allergies  Current Outpatient Medications on File Prior to Visit  Medication Sig Dispense Refill   FLUoxetine (PROZAC) 20 MG capsule      ibuprofen  (MOTRIN ) 800 MG tablet Take 800 mg by mouth     levocetirizine (XYZAL ) 5 MG tablet 1 tablet in the evening Orally Once a day     cholecalciferol (VITAMIN D3) 2,000 unit capsule Take 1 capsule by mouth once daily     ferrous sulfate  325 (65 FE) MG tablet 1 tablet Orally once daily OTC     magnesium  oxide 200 mg magnesium  Chew 2 tablets with a meal Orally Once a day for 30 day(s)      multivitamin with minerals tablet Take 1 tablet by mouth once daily     zinc sulfate 50 mg zinc (220 mg) Tab 1 tablet Orally Once a day     No current facility-administered medications on file prior to visit.    Family History  Problem Relation Age of Onset   Obesity Mother    Diabetes Mother    Obesity Father      Social History   Tobacco Use  Smoking Status Never  Smokeless Tobacco Never     Social History   Socioeconomic History   Marital status: Married  Tobacco Use   Smoking status: Never   Smokeless tobacco: Never  Vaping Use   Vaping status: Never Used  Substance and Sexual Activity   Alcohol use: Yes   Drug use: Never   Social Drivers of Health   Housing Stability: Unknown (06/09/2023)   Housing Stability Vital Sign    Homeless in the Last Year: No    Objective:    Vitals:   06/09/23 0857 06/09/23 0858  BP: (!) 118/22   Pulse: 68   Temp: 37.6 C (99.6 F)   TempSrc: Temporal   SpO2: 98%   Weight: 70.2 kg (154 lb 12.8 oz)   Height: 167.6 cm (5' 6)   PainSc:  0-No pain    Body mass index is 24.99 kg/m.  Physical Exam  Constitutional:  WDWN in NAD, conversant, no obvious deformities; lying in bed comfortably Eyes:  Pupils equal, round; sclera anicteric; moist conjunctiva; no lid lag HENT:  Oral mucosa moist; good dentition  Neck:  No masses palpated, trachea midline; no thyromegaly Lungs:  CTA bilaterally; normal respiratory effort CV:  Regular rate and rhythm; no murmurs; extremities well-perfused with no edema Abd:  +bowel sounds, soft, non-tender, no palpable organomegaly; palpable reducible umbilical hernia with a 2 cm fascial defect.  The patient has some loose redundant skin as well as the old scar from a piercing at the upper edge of the umbilicus. Musc: Normal gait; no apparent clubbing or cyanosis in extremities Lymphatic:  No palpable cervical or axillary lymphadenopathy Skin:  Warm, dry; no sign of jaundice Psychiatric - alert  and oriented x 4; calm mood and affect    Assessment and Plan:  Diagnoses and all orders for this visit:  Umbilical hernia without obstruction or gangrene    Recommend umbilical hernia repair with mesh.  We will also excise part of her old scar and some excess skin.The surgical procedure has been discussed with the patient.  Potential risks, benefits, alternative treatments, and expected outcomes have been explained.  All of the patient's questions at this time have been answered.  The likelihood of reaching the patient's treatment goal is good.  The patient understands the proposed surgical procedure and wishes to proceed.   Brittney Caraway DEWAYNE LIMA, MD  06/09/2023 11:19 AM

## 2023-07-24 ENCOUNTER — Encounter: Payer: Self-pay | Admitting: Emergency Medicine

## 2023-07-24 ENCOUNTER — Other Ambulatory Visit: Payer: Self-pay

## 2023-07-24 ENCOUNTER — Ambulatory Visit
Admission: EM | Admit: 2023-07-24 | Discharge: 2023-07-24 | Disposition: A | Discharge status: Home / Self Care | Payer: Commercial Managed Care - PPO | Attending: Family Medicine | Admitting: Family Medicine

## 2023-07-24 DIAGNOSIS — J101 Influenza due to other identified influenza virus with other respiratory manifestations: Secondary | ICD-10-CM | POA: Insufficient documentation

## 2023-07-24 DIAGNOSIS — Z20818 Contact with and (suspected) exposure to other bacterial communicable diseases: Secondary | ICD-10-CM | POA: Diagnosis present

## 2023-07-24 LAB — POC COVID19/FLU A&B COMBO
Covid Antigen, POC: NEGATIVE
Influenza A Antigen, POC: POSITIVE — AB
Influenza B Antigen, POC: NEGATIVE

## 2023-07-24 LAB — POCT RAPID STREP A (OFFICE): Rapid Strep A Screen: NEGATIVE

## 2023-07-24 MED ORDER — OSELTAMIVIR PHOSPHATE 75 MG PO CAPS: 75.0000 mg | ORAL_CAPSULE | Freq: Two times a day (BID) | ORAL | 0 refills | Status: DC

## 2023-07-24 NOTE — Discharge Instructions (Signed)
 You may start Tamiflu twice daily for 5 days.  Continue over-the-counter cough medicine/Tylenol or ibuprofen as needed.  Lots of rest and fluids.  Please follow-up with your PCP if your symptoms do not improve.  Please go to the ER for any worsening symptoms.  Hope you feel better soon!

## 2023-07-24 NOTE — ED Provider Notes (Signed)
 [facility]    CSN: [id number] Arrival date & time: 07/24/23  0802      History   Chief Complaint Chief Complaint  Patient presents with   Sore Throat    HPI Kimberly Delgado is a 38 y.o. female  presents for evaluation of URI symptoms for 1 days. Patient reports associated symptoms of, congestion, sore throat. Denies N/V/D, fevers, ear pain, body aches, shortness of breath. Patient does not have a hx of asthma.  Reports exposure to strep throat via her friend. Pt has no other concerns at this time.    Sore Throat    Past Medical History:  Diagnosis Date   AMA (advanced maternal age) multigravida 35+    Anxiety    Depression    Exercise-induced asthma    per pt no inhaler   Hernia, umbilical    History of pyelonephritis    Incompetent cervix    12-23-2018  first trimester   PCOS (polycystic ovarian syndrome)    Seasonal allergies    Wears contact lenses     Patient Active Problem List   Diagnosis Date Noted   Request for sterilization 07/09/2019   Postpartum care following cesarean delivery 07/09/2019   SVD (spontaneous vaginal delivery) 08/12/2015   Postpartum care following vaginal delivery (3/18) 08/12/2015   Active labor at term 08/11/2015   Pregnancy 05/14/2015   Cervical incompetence @ 23 weeks 02/26/2013   Threatened preterm labor, antepartum 02/26/2013    Past Surgical History:  Procedure Laterality Date   ABDOMINAL CERCLAGE N/A 12/25/2018   Procedure: TRANS CERCLAGE ABDOMINAL;  Surgeon: Fermin Schwab, MD;  [facility]: [facility];  Service: Gynecology;  Laterality: N/A;   CERVICAL CERCLAGE N/A 02/17/2015   Procedure: CERCLAGE CERVICAL;  Surgeon: Maxie Better, MD;  Location: [facility];  Service: Gynecology;  Laterality: N/A;  EDD: 08/16/15   CESAREAN SECTION WITH BILATERAL TUBAL LIGATION Bilateral 07/09/2019   Procedure: Primary CESAREAN SECTION WITH BILATERAL TUBAL LIGATION;  Surgeon: Maxie Better, MD;   Location: [facility];  Service: Obstetrics;  Laterality: Bilateral;  EDD: 07/14/19 Tracey RNFA   DILATATION & CURETTAGE/HYSTEROSCOPY WITH MYOSURE N/A 05/03/2022   Procedure: DILATATION & CURETTAGE/HYSTEROSCOPY WITH MYOSURE/NOVASURE ABLATION;  Surgeon: Maxie Better, MD;  Location: [facility];  Service: Gynecology;  Laterality: N/A;   WISDOM TOOTH EXTRACTION      OB History     Gravida  4   Para  3   Term  2   Preterm  1   AB  1   Living  2      SAB  1   IAB  0   Ectopic  0   Multiple  0   Live Births  3            Home Medications    Prior to Admission medications   Medication Sig Start Date End Date Taking? Authorizing Provider  oseltamivir (TAMIFLU) 75 MG capsule Take 1 capsule (75 mg total) by mouth every 12 (twelve) hours. 07/24/23  Yes Radford Pax, NP  amoxicillin-clavulanate (AUGMENTIN) 875-125 MG tablet Take 1 tablet by mouth 2 (two) times daily.    [provider]  fluticasone (FLONASE) 50 MCG/ACT nasal spray Place 1 spray into both nostrils daily.    [provider]  ibuprofen (ADVIL) 800 MG tablet Take 1 tablet (800 mg total) by mouth every 8 (eight) hours as needed for moderate pain or mild pain. 05/03/22   Maxie Better, MD  levocetirizine Elita Boone)  5 MG tablet Take 1 tablet (5 mg total) by mouth every evening. 04/01/22   Wallis Bamberg, PA-C  Multiple Vitamin (MULTIVITAMIN WITH MINERALS) TABS tablet Take 1 tablet by mouth daily.    [provider]    Family History Family History  Problem Relation Age of Onset   Diabetes Other    Diabetes Mother    Diabetes Maternal Grandfather    Asthma Maternal Grandfather     Social History Social History   Tobacco Use   Smoking status: Never   Smokeless tobacco: Never  Vaping Use   Vaping status: Never Used  Substance Use Topics   Alcohol use: Not Currently   Drug use: Never     Allergies   Latex   Review of Systems Review of Systems  HENT:   Positive for congestion and sore throat.   Respiratory:  Positive for cough.      Physical Exam Triage Vital Signs ED Triage Vitals  Encounter Vitals Group     BP 07/24/23 0817 131/85     Systolic BP Percentile --      Diastolic BP Percentile --      Pulse Rate 07/24/23 0817 83     Resp 07/24/23 0817 18     Temp 07/24/23 0817 98.9 F (37.2 C)     Temp Source 07/24/23 0817 Oral     SpO2 07/24/23 0817 98 %     Weight --      Height --      Head Circumference --      Peak Flow --      Pain Score 07/24/23 0818 5     Pain Loc --      Pain Education --      Exclude from Growth Chart --    No data found.  Updated Vital Signs BP 131/85 (BP Location: Right Arm)   Pulse 83   Temp 98.9 F (37.2 C) (Oral)   Resp 18   SpO2 98%   Visual Acuity Right Eye Distance:   Left Eye Distance:   Bilateral Distance:    Right Eye Near:   Left Eye Near:    Bilateral Near:     Physical Exam Vitals and nursing note reviewed.  Constitutional:      General: She is not in acute distress.    Appearance: She is well-developed. She is not ill-appearing.  HENT:     Head: Normocephalic and atraumatic.     Right Ear: Tympanic membrane and ear canal normal.     Left Ear: Tympanic membrane and ear canal normal.     Nose: Congestion present.     Mouth/Throat:     Mouth: Mucous membranes are moist.     Pharynx: Oropharynx is clear. Uvula midline. Posterior oropharyngeal erythema present.     Tonsils: No tonsillar exudate or tonsillar abscesses.  Eyes:     Conjunctiva/sclera: Conjunctivae normal.     Pupils: Pupils are equal, round, and reactive to light.  Cardiovascular:     Rate and Rhythm: Normal rate and regular rhythm.     Heart sounds: Normal heart sounds.  Pulmonary:     Effort: Pulmonary effort is normal.     Breath sounds: Normal breath sounds.  Musculoskeletal:     Cervical back: Normal range of motion and neck supple.  Lymphadenopathy:     Cervical: No cervical adenopathy.   Skin:    General: Skin is warm and dry.  Neurological:     General: No focal  deficit present.     Mental Status: She is alert and oriented to person, place, and time.  Psychiatric:        Mood and Affect: Mood normal.        Behavior: Behavior normal.      UC Treatments / Results  Labs (all labs ordered are listed, but only abnormal results are displayed) Labs Reviewed  POC COVID19/FLU A&B COMBO - Abnormal; Notable for the following components:      Result Value   Influenza A Antigen, POC Positive (*)    All other components within normal limits  CULTURE, GROUP A STREP [facility])  POCT RAPID STREP A (OFFICE)    EKG   Radiology No results found.  Procedures Procedures (including critical care time)  Medications Ordered in UC Medications - No data to display  Initial Impression / Assessment and Plan / UC Course  I have reviewed the triage vital signs and the nursing notes.  Pertinent labs & imaging results that were available during my care of the patient were reviewed by me and considered in my medical decision making (see chart for details).     Reviewed exam and symptoms with patient.  No red flags.  Negative rapid strep, will culture given close exposure.  Positive influenza A, start Tamiflu.  Patient will continue OTC cough medicine/fever reducing medications as needed.  Reviewed viral illness and symptomatic treatment.  PCP follow-up if symptoms do not improve.  ER precautions reviewed and patient verbalized understanding. Final Clinical Impressions(s) / UC Diagnoses   Final diagnoses:  Influenza A  Exposure to strep throat     Discharge Instructions      You may start Tamiflu twice daily for 5 days.  Continue over-the-counter cough medicine/Tylenol or ibuprofen as needed.  Lots of rest and fluids.  Please follow-up with your PCP if your symptoms do not improve.  Please go to the ER for any worsening symptoms.  Hope you feel better soon!     ED  Prescriptions     Medication Sig Dispense Auth. Provider   oseltamivir (TAMIFLU) 75 MG capsule Take 1 capsule (75 mg total) by mouth every 12 (twelve) hours. 10 capsule Radford Pax, NP      PDMP not reviewed this encounter.   Radford Pax, NP 07/24/23 [phone]

## 2023-07-24 NOTE — ED Triage Notes (Signed)
 Pt c/o persistent cough and sore throat for the past 24 hrs, states she is been expose to a person positive for strep. Requesting to be check for that.

## 2023-07-27 LAB — CULTURE, GROUP A STREP (THRC)

## 2023-08-04 ENCOUNTER — Ambulatory Visit
Admission: RE | Admit: 2023-08-04 | Discharge: 2023-08-04 | Disposition: A | Discharge status: Home / Self Care | Source: Ambulatory Visit | Attending: Urgent Care | Admitting: Urgent Care

## 2023-08-04 ENCOUNTER — Ambulatory Visit
Admission: RE | Admit: 2023-08-04 | Discharge: 2023-08-04 | Disposition: A | Discharge status: Home / Self Care | Source: Ambulatory Visit | Attending: *Deleted | Admitting: *Deleted

## 2023-08-04 ENCOUNTER — Ambulatory Visit
Admission: EM | Admit: 2023-08-04 | Discharge: 2023-08-04 | Disposition: A | Discharge status: Home / Self Care | Attending: Family Medicine | Admitting: Family Medicine

## 2023-08-04 DIAGNOSIS — J208 Acute bronchitis due to other specified organisms: Secondary | ICD-10-CM

## 2023-08-04 DIAGNOSIS — R079 Chest pain, unspecified: Secondary | ICD-10-CM | POA: Insufficient documentation

## 2023-08-04 MED ORDER — PROMETHAZINE-DM 6.25-15 MG/5ML PO SYRP: 5.0000 mL | ORAL_SOLUTION | Freq: Every evening | ORAL | 0 refills | Status: DC | PRN

## 2023-08-04 MED ORDER — PREDNISONE 20 MG PO TABS: ORAL_TABLET | ORAL | 0 refills | Status: DC

## 2023-08-04 MED ORDER — ALBUTEROL SULFATE HFA 108 (90 BASE) MCG/ACT IN AERS: 1.0000 | INHALATION_SPRAY | Freq: Four times a day (QID) | RESPIRATORY_TRACT | 0 refills | Status: DC | PRN

## 2023-08-04 NOTE — ED Triage Notes (Signed)
 Pt states she was dx with the flu ~2 weeks ago-states she is still having cough, chest congestion, right rib pain-denies known fever-NAD-steady gait

## 2023-08-04 NOTE — Discharge Instructions (Addendum)
 Start prednisone for viral bronchitis. Use albuterol and cough syrup as needed. Please present to any [facility] or outpatient imaging facility to complete your chest x-ray. I will update you later with these results.

## 2023-08-04 NOTE — ED Provider Notes (Signed)
 [facility]  Note:  This document was prepared using Conservation officer, historic buildings and may include unintentional dictation errors.  MRN: [id number] DOB: Oct 18, 1985  Subjective:   Kimberly Delgado is a 38 y.o. female presenting for 2-week history of acute onset persistent and worsening cough in the setting chest pain of the right lower side.  Patient was initially diagnosed with influenza and has been coughing this entire time.  No active shortness of breath or wheezing.  However, patient does have a history of reactive airway.  She has also had pneumonia diagnosed in the past.  Would like to make sure that this is not the case for her.  Patient has been training for a running event for the past year.  Would like to know if she is able to carry on with this coming weekend.  No smoking of any kind including cigarettes, cigars, vaping, marijuana use.    No current facility-administered medications for this encounter.  Current Outpatient Medications:    amoxicillin-clavulanate (AUGMENTIN) 875-125 MG tablet, Take 1 tablet by mouth 2 (two) times daily., Disp: , Rfl:    fluticasone (FLONASE) 50 MCG/ACT nasal spray, Place 1 spray into both nostrils daily., Disp: , Rfl:    ibuprofen (ADVIL) 800 MG tablet, Take 1 tablet (800 mg total) by mouth every 8 (eight) hours as needed for moderate pain or mild pain., Disp: 30 tablet, Rfl: 11   ipratropium (ATROVENT) 0.03 % nasal spray, Place into both nostrils., Disp: , Rfl:    levocetirizine (XYZAL) 5 MG tablet, Take 1 tablet (5 mg total) by mouth every evening., Disp: 90 tablet, Rfl: 0   Multiple Vitamin (MULTIVITAMIN WITH MINERALS) TABS tablet, Take 1 tablet by mouth daily., Disp: , Rfl:    oseltamivir (TAMIFLU) 75 MG capsule, Take 1 capsule (75 mg total) by mouth every 12 (twelve) hours., Disp: 10 capsule, Rfl: 0   Allergies  Allergen Reactions   Latex Rash    Past Medical History:  Diagnosis Date   AMA (advanced  maternal age) multigravida 35+    Anxiety    Depression    Exercise-induced asthma    per pt no inhaler   Hernia, umbilical    History of pyelonephritis    Incompetent cervix    12-23-2018  first trimester   PCOS (polycystic ovarian syndrome)    Seasonal allergies    Wears contact lenses      Past Surgical History:  Procedure Laterality Date   ABDOMINAL CERCLAGE N/A 12/25/2018   Procedure: TRANS CERCLAGE ABDOMINAL;  Surgeon: Fermin Schwab, MD;  Location: [facility];  Service: Gynecology;  Laterality: N/A;   CERVICAL CERCLAGE N/A 02/17/2015   Procedure: CERCLAGE CERVICAL;  Surgeon: Maxie Better, MD;  Location: [facility];  Service: Gynecology;  Laterality: N/A;  EDD: 08/16/15   CESAREAN SECTION WITH BILATERAL TUBAL LIGATION Bilateral 07/09/2019   Procedure: Primary CESAREAN SECTION WITH BILATERAL TUBAL LIGATION;  Surgeon: Maxie Better, MD;  Location: [facility];  Service: Obstetrics;  Laterality: Bilateral;  EDD: 07/14/19 Tracey RNFA   DILATATION & CURETTAGE/HYSTEROSCOPY WITH MYOSURE N/A 05/03/2022   Procedure: DILATATION & CURETTAGE/HYSTEROSCOPY WITH MYOSURE/NOVASURE ABLATION;  Surgeon: Maxie Better, MD;  Location: [facility];  Service: Gynecology;  Laterality: N/A;   WISDOM TOOTH EXTRACTION      Family History  Problem Relation Age of Onset   Diabetes Other    Diabetes Mother    Diabetes Maternal Grandfather    Asthma Maternal Grandfather  Social History   Tobacco Use   Smoking status: Never   Smokeless tobacco: Never  Vaping Use   Vaping status: Never Used  Substance Use Topics   Alcohol use: Not Currently   Drug use: Never    ROS   Objective:   Vitals: BP 117/75 (BP Location: Right Arm)   Pulse (!) 59   Temp 97.9 F (36.6 C) (Oral)   Resp 16   LMP 08/01/2023   SpO2 97%   Physical Exam Constitutional:      General: She is not in acute distress.    Appearance: Normal appearance. She is well-developed.  She is not ill-appearing, toxic-appearing or diaphoretic.  HENT:     Head: Normocephalic and atraumatic.     Right Ear: External ear normal.     Left Ear: External ear normal.     Nose: Nose normal.     Mouth/Throat:     Mouth: Mucous membranes are moist.  Eyes:     General: No scleral icterus.       Right eye: No discharge.        Left eye: No discharge.     Extraocular Movements: Extraocular movements intact.  Cardiovascular:     Rate and Rhythm: Normal rate and regular rhythm.     Heart sounds: Normal heart sounds. No murmur heard.    No friction rub. No gallop.  Pulmonary:     Effort: Pulmonary effort is normal. No respiratory distress.     Breath sounds: No stridor. No wheezing, rhonchi or rales.  Chest:     Chest wall: No tenderness.  Skin:    General: Skin is warm and dry.  Neurological:     General: No focal deficit present.     Mental Status: She is alert and oriented to person, place, and time.  Psychiatric:        Mood and Affect: Mood normal.        Behavior: Behavior normal.     Assessment and Plan :   PDMP not reviewed this encounter.  1. Acute viral bronchitis   2. Right-sided chest pain    Recommended managing for viral bronchitis as a working diagnosis given her recent history of influenza and current symptom set.  Will be using a prednisone course, albuterol and cough syrup.  Recommend supportive care otherwise.  I am pursuing an outpatient x-ray as a double check to rule out pneumonia.  Counseled patient on potential for adverse effects with medications prescribed/recommended today, ER and return-to-clinic precautions discussed, patient verbalized understanding.    Wallis Bamberg, [place] 08/04/23 [id number]

## 2023-08-11 ENCOUNTER — Other Ambulatory Visit: Payer: Commercial Managed Care - PPO

## 2023-08-21 ENCOUNTER — Other Ambulatory Visit: Payer: Self-pay | Admitting: Surgery

## 2023-08-25 LAB — SURGICAL PATHOLOGY

## 2023-08-26 ENCOUNTER — Ambulatory Visit

## 2023-08-29 ENCOUNTER — Ambulatory Visit: Attending: Internal Medicine | Admitting: Physical Therapy

## 2023-08-29 DIAGNOSIS — M25511 Pain in right shoulder: Secondary | ICD-10-CM | POA: Diagnosis present

## 2023-08-29 DIAGNOSIS — G8929 Other chronic pain: Secondary | ICD-10-CM | POA: Diagnosis present

## 2023-08-29 DIAGNOSIS — R29898 Other symptoms and signs involving the musculoskeletal system: Secondary | ICD-10-CM | POA: Insufficient documentation

## 2023-08-29 DIAGNOSIS — M546 Pain in thoracic spine: Secondary | ICD-10-CM | POA: Diagnosis present

## 2023-08-29 NOTE — Therapy (Signed)
 OUTPATIENT PHYSICAL THERAPY THORACOLUMBAR EVALUATION   Patient Name: Kimberly Delgado MRN: [id number] DOB:May 06, 1986, 38 y.o., female Today's Date: 08/29/2023  END OF SESSION:  PT End of Session - 08/29/23 0849     Visit Number 1    Number of Visits 16    Date for PT Re-Evaluation 10/24/23    PT Start Time 0850    PT Stop Time 0930    PT Time Calculation (min) 40 min    Activity Tolerance Patient tolerated treatment well    Behavior During Therapy [facility] for tasks assessed/performed             Past Medical History:  Diagnosis Date   AMA (advanced maternal age) multigravida 35+    Anxiety    Depression    Exercise-induced asthma    per pt no inhaler   Hernia, umbilical    History of pyelonephritis    Incompetent cervix    12-23-2018  first trimester   PCOS (polycystic ovarian syndrome)    Seasonal allergies    Wears contact lenses    Past Surgical History:  Procedure Laterality Date   ABDOMINAL CERCLAGE N/A 12/25/2018   Procedure: TRANS CERCLAGE ABDOMINAL;  Surgeon: Fermin Schwab, MD;  [facility];  Service: Gynecology;  Laterality: N/A;   CERVICAL CERCLAGE N/A 02/17/2015   Procedure: CERCLAGE CERVICAL;  Surgeon: Maxie Better, MD;  Location: [facility];  Service: Gynecology;  Laterality: N/A;  EDD: 08/16/15   CESAREAN SECTION WITH BILATERAL TUBAL LIGATION Bilateral 07/09/2019   Procedure: Primary CESAREAN SECTION WITH BILATERAL TUBAL LIGATION;  Surgeon: Maxie Better, MD;  Location: [facility];  Service: Obstetrics;  Laterality: Bilateral;  EDD: 07/14/19 Tracey RNFA   DILATATION & CURETTAGE/HYSTEROSCOPY WITH MYOSURE N/A 05/03/2022   Procedure: DILATATION & CURETTAGE/HYSTEROSCOPY WITH MYOSURE/NOVASURE ABLATION;  Surgeon: Maxie Better, MD;  Location: [facility];  Service: Gynecology;  Laterality: N/A;   WISDOM TOOTH EXTRACTION     Patient Active Problem List   Diagnosis Date Noted   Request for  sterilization 07/09/2019   Postpartum care following cesarean delivery 07/09/2019   SVD (spontaneous vaginal delivery) 08/12/2015   Postpartum care following vaginal delivery (3/18) 08/12/2015   Active labor at term 08/11/2015   Pregnancy 05/14/2015   Cervical incompetence @ 23 weeks 02/26/2013   Threatened preterm labor, antepartum 02/26/2013    PCP: Lorenda Ishihara, MD   REFERRING PROVIDER: Lorenda Ishihara, MD   REFERRING DIAG:  Diagnosis  M62.830 (ICD-10-CM) - Paraspinal muscle spasm    Rationale for Evaluation and Treatment: Rehabilitation  THERAPY DIAG:  Pain in thoracic spine  Chronic right shoulder pain  ONSET DATE: >3 years   SUBJECTIVE:  SUBJECTIVE STATEMENT: Pt states that 3 years ago, she reports lifting car seat out of car. Felt like she pulled a muscle and has been hurting off and on since injury. Reports that core strengthening will help pain, but if she doesn't run or perform exercises for a couple days the pain becomes significant at night. Pain is worse after work and after washing dishes at sink in flexed posture.  Pt is an avid runner. Next 1/2 marathon will be in September in [place]  PERTINENT HISTORY:  Recent Umbilical hernia repair surgery. Will be on precautions until at least 4/15.    PAIN:  Are you having pain? Yes: NPRS scale: 1/10  Pain location: under the R shoulder blade   Pain description: at worst. Pain is aching/throbbing  Aggravating factors: bending over for long period of time at work Relieving factors: core exercise.   PRECAUTIONS: Other: hernia surgery 8 days ago   RED FLAGS: None   WEIGHT BEARING RESTRICTIONS:  no lifting >20lbs until 4/18   FALLS:  Has patient fallen in last 6 months? No  LIVING ENVIRONMENT: Lives with:  lives with their family and lives with their spouse Lives in: House/apartment Stairs: Yes: Internal: 15 steps; on right going up and External: 1 steps; on right going up Has following equipment at home: None  OCCUPATION: Medical physists. In [employer] [employer]   PLOF: Independent  PATIENT GOALS: decrease back pain   NEXT MD VISIT: 4/15  OBJECTIVE:  Note: Objective measures were completed at Evaluation unless otherwise noted.  DIAGNOSTIC FINDINGS:  DG x ray:  IMPRESSION: Subtle levoscoliosis lumbar spine at the L2-L3 apex with minimal narrowing of the L5-S1 disc space.   PATIENT SURVEYS:  Modified Oswestry to be completed    COGNITION: Overall cognitive status: Within functional limits for tasks assessed     SENSATION: WFL  MUSCLE LENGTH:  POSTURE:  mild rounded shoulders.  PALPATION: Noted TP in the R side middle and lower traps as well as distal Lats and teres minor/major. Diffiultying palpating subscapularis due to lat tightness on the RLE  R scapula winging Asymetrical scapula movement with shoulder flexion and abduction   LUMBAR ROM:  Limited assessment due to recent umbilical hernia surgery  AROM eval  Flexion   Extension   Right lateral flexion   Left lateral flexion   Right rotation 25 deg   Left rotation 45 deg   (Blank rows = not tested)  LOWER EXTREMITY ROM:  WFL   Upper  EXTREMITY ROM:  Grossly WFL but noted mild asymmetry in R scapula .    Upper extremity  EXTREMITY MMT:    MMT Right eval Left eval  Shoulder extension *4 *4+  shoulder flexion *4+ *4+  shoulder abduction 4+ 4+  Shoulder adduction    Hip internal rotation 4+ 4+  Hip external rotation 4+ 4+  elbow flexion    Elbow extension    * Limited from pain abdomin  (Blank rows = not tested)  LUMBAR SPECIAL TESTS:  Recent Umbilical Hernia repair limits full assessment   GAIT: Distance walked: 60 Assistive device utilized: None Level of assistance: Complete  Independence Comments: noted mild shoulder elevation bil decreased trunkal rotation    TREATMENT DATE: 08/29/2023  Access Code: [email or web address] URL: [email or web address] Date: 08/29/2023 Prepared by: Grier Rocher  Exercises - Supine Shoulder External Rotation with Resistance  - 1 x daily - 5 x weekly - 3 sets - 10 reps - 3 hold - Hooklying Shoulder Y  - 1  x daily - 5 x weekly - 3 sets - 10 reps - 2 hold - Supine Isometric Shoulder Extension with Towel  - 1 x daily - 5 x weekly - 3 sets - 10 reps - 3 hold                                                                                                                                 PATIENT EDUCATION:  Education details: POC. TDN.  Person educated: Patient Education method: Explanation Education comprehension: verbalized understanding  HOME EXERCISE PROGRAM: Access Code: [email or web address] URL: [email or web address] Date: 08/29/2023 Prepared by: Grier Rocher  Exercises - Supine Shoulder External Rotation with Resistance  - 1 x daily - 5 x weekly - 3 sets - 10 reps - 3 hold - Hooklying Shoulder Y  - 1 x daily - 5 x weekly - 3 sets - 10 reps - 2 hold - Supine Isometric Shoulder Extension with Towel  - 1 x daily - 5 x weekly - 3 sets - 10 reps - 3 hold  ASSESSMENT:  CLINICAL IMPRESSION: Patient is a 38 y.o. female who was seen today for physical therapy evaluation and treatment for R side back and shoulder pain. Pt demonstrates mild asymmetry with scapula movement, increased winging with IR on the R shoulder, TP noted in R side lower and mid traps as well as R teres major/minor. Mild decreased vertebra mobility from T3-T8 and decreased rib/vertebra mobility at T8 on the R side.  Pt will benefit from skilled PT to address Tspine and R shoulder pain to allow improved QoL and return to PLOF.    OBJECTIVE IMPAIRMENTS: decreased strength, hypomobility, impaired perceived functional ability, improper body mechanics, postural  dysfunction, and pain.   ACTIVITY LIMITATIONS: carrying, lifting, squatting, bed mobility, reach over head, hygiene/grooming, and caring for others  PARTICIPATION LIMITATIONS: meal prep, cleaning, laundry, community activity, occupation, and yard work  PERSONAL FACTORS: Fitness and Time since onset of injury/illness/exacerbation are also affecting patient's functional outcome.   REHAB POTENTIAL: Excellent  CLINICAL DECISION MAKING: Stable/uncomplicated  EVALUATION COMPLEXITY: Low   GOALS: Goals reviewed with patient? Yes  SHORT TERM GOALS: Target date: 09/26/2023   Patient will be independent in home exercise program to improve strength/mobility for better functional independence with ADLs. Baseline: initiated 4/4 Goal status: INITIAL   LONG TERM GOALS: Target date: 10/24/2023    Patient will increase mod ODI  score to equal to or greater than 5 points   to demonstrate statistically significant improvement in mobility and quality of life.  Baseline: to be completed Goal status: INITIAL  2.  Patient will tolerate sitting at work for >2 hours without pain and improved posture  Baseline: pain after 1 hour, with severe pain at 4 hours.  Goal status: INITIAL  3.  Patient will demonstrate ability to wash dishes without pain to improve function at home  Baseline: moderate pain while standing at sink Goal status: INITIAL  4.  Patient will return to running without pain to return to PLOF. Baseline: Pt unable to run since recent surgery   Goal status: INITIAL  5.  Patient will increase BUE strength to grossly 4+/5 to 5/5 to allow lifting up child at home without pain  Baseline: see UE MMT, with pain in abdomin  Goal status: INITIAL   PLAN:  PT FREQUENCY: 1-2x/week  PT DURATION: 8 weeks  PLANNED INTERVENTIONS: 97110-Therapeutic exercises, 97530- Therapeutic activity, 97112- Neuromuscular re-education, 97535- Self Care, 82956- Manual therapy, [id number]- Aquatic Therapy, [id number]-  Electrical stimulation (unattended), [id number]- Electrical stimulation (manual), [id number]- Vasopneumatic device, [id number]- Ultrasound, [id number]- Traction (mechanical), [id number]- Ionotophoresis 4mg /ml Dexamethasone, Patient/Family education, Balance training, Stair training, Taping, Dry Needling, Joint mobilization, Joint manipulation, Spinal manipulation, Spinal mobilization, Cryotherapy, and Moist heat.  PLAN FOR NEXT SESSION:  Manual for pain management in Tspine and R scapula TDN Review/expand HEP.    Golden Pop, PT 08/29/2023, 12:19 PM

## 2023-09-01 ENCOUNTER — Ambulatory Visit

## 2023-09-01 DIAGNOSIS — M546 Pain in thoracic spine: Secondary | ICD-10-CM | POA: Diagnosis not present

## 2023-09-01 DIAGNOSIS — G8929 Other chronic pain: Secondary | ICD-10-CM

## 2023-09-01 DIAGNOSIS — R29898 Other symptoms and signs involving the musculoskeletal system: Secondary | ICD-10-CM

## 2023-09-01 NOTE — Therapy (Signed)
 OUTPATIENT PHYSICAL THERAPY THORACOLUMBAR TREATMENT   Patient Name: Kimberly Delgado MRN: [id number] DOB:January 14, 1986, 38 y.o., female Today's Date: 09/01/2023  END OF SESSION:  PT End of Session - 09/01/23 0715     Visit Number 2    Number of Visits 16    Date for PT Re-Evaluation 10/24/23    PT Start Time 0715    PT Stop Time 0758    PT Time Calculation (min) 43 min    Activity Tolerance Patient tolerated treatment well    Behavior During Therapy [facility] for tasks assessed/performed              Past Medical History:  Diagnosis Date   AMA (advanced maternal age) multigravida 35+    Anxiety    Depression    Exercise-induced asthma    per pt no inhaler   Hernia, umbilical    History of pyelonephritis    Incompetent cervix    12-23-2018  first trimester   PCOS (polycystic ovarian syndrome)    Seasonal allergies    Wears contact lenses    Past Surgical History:  Procedure Laterality Date   ABDOMINAL CERCLAGE N/A 12/25/2018   Procedure: TRANS CERCLAGE ABDOMINAL;  Surgeon: Fermin Schwab, MD;  Location: [facility];  Service: Gynecology;  Laterality: N/A;   CERVICAL CERCLAGE N/A 02/17/2015   Procedure: CERCLAGE CERVICAL;  Surgeon: Maxie Better, MD;  Location: [facility];  Service: Gynecology;  Laterality: N/A;  EDD: 08/16/15   CESAREAN SECTION WITH BILATERAL TUBAL LIGATION Bilateral 07/09/2019   Procedure: Primary CESAREAN SECTION WITH BILATERAL TUBAL LIGATION;  Surgeon: Maxie Better, MD;  Location: [facility];  Service: Obstetrics;  Laterality: Bilateral;  EDD: 07/14/19 Tracey RNFA   DILATATION & CURETTAGE/HYSTEROSCOPY WITH MYOSURE N/A 05/03/2022   Procedure: DILATATION & CURETTAGE/HYSTEROSCOPY WITH MYOSURE/NOVASURE ABLATION;  Surgeon: Maxie Better, MD;  Location: [facility];  Service: Gynecology;  Laterality: N/A;   WISDOM TOOTH EXTRACTION     Patient Active Problem List   Diagnosis Date Noted   Request for  sterilization 07/09/2019   Postpartum care following cesarean delivery 07/09/2019   SVD (spontaneous vaginal delivery) 08/12/2015   Postpartum care following vaginal delivery (3/18) 08/12/2015   Active labor at term 08/11/2015   Pregnancy 05/14/2015   Cervical incompetence @ 23 weeks 02/26/2013   Threatened preterm labor, antepartum 02/26/2013    PCP: Lorenda Ishihara, MD   REFERRING PROVIDER: Lorenda Ishihara, MD   REFERRING DIAG:  Diagnosis  M62.830 (ICD-10-CM) - Paraspinal muscle spasm    Rationale for Evaluation and Treatment: Rehabilitation  THERAPY DIAG:  Pain in thoracic spine  Chronic right shoulder pain  Shoulder weakness  ONSET DATE: >3 years   SUBJECTIVE:  SUBJECTIVE STATEMENT: Patient is awaiting release of precautions for surgery.   PERTINENT HISTORY:  Recent Umbilical hernia repair surgery. Will be on precautions until at least 4/15. Pt states that 3 years ago, she reports lifting car seat out of car. Felt like she pulled a muscle and has been hurting off and on since injury. Reports that core strengthening will help pain, but if she doesn't run or perform exercises for a couple days the pain becomes significant at night. Pain is worse after work and after washing dishes at sink in flexed posture.  Pt is an avid runner. Next 1/2 marathon will be in September in [place]   PAIN:  Are you having pain? Yes: NPRS scale: 1/10  Pain location: under the R shoulder blade   Pain description: at worst. Pain is aching/throbbing  Aggravating factors: bending over for long period of time at work Relieving factors: core exercise.   PRECAUTIONS: Other: hernia surgery 8 days ago   RED FLAGS: None   WEIGHT BEARING RESTRICTIONS:  no lifting >20lbs until 4/18   FALLS:   Has patient fallen in last 6 months? No  LIVING ENVIRONMENT: Lives with: lives with their family and lives with their spouse Lives in: House/apartment Stairs: Yes: Internal: 15 steps; on right going up and External: 1 steps; on right going up Has following equipment at home: None  OCCUPATION: Medical physists. In [employer]   PLOF: Independent  PATIENT GOALS: decrease back pain   NEXT MD VISIT: 4/15  OBJECTIVE:  Note: Objective measures were completed at Evaluation unless otherwise noted.  DIAGNOSTIC FINDINGS:  DG x ray:  IMPRESSION: Subtle levoscoliosis lumbar spine at the L2-L3 apex with minimal narrowing of the L5-S1 disc space.   PATIENT SURVEYS:  Modified Oswestry to be completed    COGNITION: Overall cognitive status: Within functional limits for tasks assessed     SENSATION: WFL  MUSCLE LENGTH:  POSTURE:  mild rounded shoulders.  PALPATION: Noted TP in the R side middle and lower traps as well as distal Lats and teres minor/major. Diffiultying palpating subscapularis due to lat tightness on the RLE  R scapula winging Asymetrical scapula movement with shoulder flexion and abduction   LUMBAR ROM:  Limited assessment due to recent umbilical hernia surgery  AROM eval  Flexion   Extension   Right lateral flexion   Left lateral flexion   Right rotation 25 deg   Left rotation 45 deg   (Blank rows = not tested)  LOWER EXTREMITY ROM:  WFL   Upper  EXTREMITY ROM:  Grossly WFL but noted mild asymmetry in R scapula .    Upper extremity  EXTREMITY MMT:    MMT Right eval Left eval  Shoulder extension *4 *4+  shoulder flexion *4+ *4+  shoulder abduction 4+ 4+  Shoulder adduction    Hip internal rotation 4+ 4+  Hip external rotation 4+ 4+  elbow flexion    Elbow extension    * Limited from pain abdomin  (Blank rows = not tested)  LUMBAR SPECIAL TESTS:  Recent Umbilical Hernia repair limits full assessment   GAIT: Distance walked:  60 Assistive device utilized: None Level of assistance: Complete Independence Comments: noted mild shoulder elevation bil decreased trunkal rotation    TREATMENT DATE: 09/01/2023 Manual:  Mobilizations thoracic spine; grade II- defer due to hernia referrals  ER/IR PROM at full ROM x multiple minutes Cross body adduction AROM with distraction at [facility] joint 10x Cervical side bend with overpressure at [facility]  joint and occiput 3x30 seconds  TherEx Scap stabilization with overpressure: sidelying abduction 10x  Wall ABC's with ball x2 sets RTB overhead Y 10x Scapular punches 10x; focus on scapular control  Trigger Point Dry Needling  Initial Treatment: Pt instructed on Dry Needling rational, procedures, and possible side effects. Pt instructed to expect mild to moderate muscle soreness later in the day and/or into the next day.  Pt instructed in methods to reduce muscle soreness. Pt instructed to continue prescribed HEP. Because Dry Needling was performed over or adjacent to a lung field, pt was educated on S/S of pneumothorax and to seek immediate medical attention should they occur.  Patient was educated on signs and symptoms of infection and other risk factors and advised to seek medical attention should they occur.  Patient verbalized understanding of these instructions and education.   Patient Verbal Consent Given: Yes Education Handout Provided: Yes Muscles Treated: R upper trap and subscap Electrical Stimulation Performed: No Treatment Response/Outcome: multiple twitch responses     PATIENT EDUCATION:  Education details: POC. TDN.  Person educated: Patient Education method: Explanation Education comprehension: verbalized understanding  HOME EXERCISE PROGRAM: Access Code: [email or web address] URL: [email or web address] Date: 08/29/2023 Prepared by: Grier Rocher  Exercises - Supine Shoulder External Rotation with Resistance  - 1 x daily - 5 x weekly - 3 sets - 10 reps - 3  hold - Hooklying Shoulder Y  - 1 x daily - 5 x weekly - 3 sets - 10 reps - 2 hold - Supine Isometric Shoulder Extension with Towel  - 1 x daily - 5 x weekly - 3 sets - 10 reps - 3 hold  Trigger Point Dry Needling  What is Trigger Point Dry Needling (DN)? DN is a physical therapy technique used to treat muscle pain and dysfunction. Specifically, DN helps deactivate muscle trigger points (muscle knots).  A thin filiform needle is used to penetrate the skin and stimulate the underlying trigger point. The goal is for a local twitch response (LTR) to occur and for the trigger point to relax. No medication of any kind is injected during the procedure.   What Does Trigger Point Dry Needling Feel Like?  The procedure feels different for each individual patient. Some patients report that they do not actually feel the needle enter the skin and overall the process is not painful. Very mild bleeding may occur. However, many patients feel a deep cramping in the muscle in which the needle was inserted. This is the local twitch response.   How Will I feel after the treatment? Soreness is normal, and the onset of soreness may not occur for a few hours. Typically this soreness does not last longer than two days.  Bruising is uncommon, however; ice can be used to decrease any possible bruising.  In rare cases feeling tired or nauseous after the treatment is normal. In addition, your symptoms may get worse before they get better, this period will typically not last longer than 24 hours.   What Can I do After My Treatment? Increase your hydration by drinking more water for the next 24 hours.  You may place ice or heat on the areas treated that have become sore, however, do not use heat on inflamed or bruised areas. Heat often brings more relief post needling. You can continue your regular activities, but vigorous activity is not recommended initially after the treatment for 24 hours. DN is best combined with other  physical therapy such as strengthening, stretching, and other therapies.  What are the complications? While your therapist has had extensive training in minimizing the risks of trigger point dry needling, it is important to understand the risks of any procedure.  Risks include bleeding, pain, fatigue, hematoma, infection, vertigo, nausea or nerve involvement. Monitor for any changes to your skin or sensation. Contact your therapist or MD with concerns.  A rare but serious complication is a pneumothorax over or near your middle and upper chest and back If you have dry needling in this area, monitor for the following symptoms: Shortness of breath on exertion and/or Difficulty taking a deep breath and/or Chest Pain and/or A dry cough If any of the above symptoms develop, please go to the nearest emergency room or call 911. Tell them you had dry needling over your thorax and report any symptoms you are having. Please follow-up with your treating therapist after you complete the medical evaluation.   ASSESSMENT:  CLINICAL IMPRESSION: Patient presents with good motivation. Some interventions contra-indicated due to patients restrictions s/p umbilical surgery. Scapular stabilization and release of periscapular musculature tolerated well with patient verbalizing understanding of precautions.  Pt will benefit from skilled PT to address Tspine and R shoulder pain to allow improved QoL and return to PLOF.    OBJECTIVE IMPAIRMENTS: decreased strength, hypomobility, impaired perceived functional ability, improper body mechanics, postural dysfunction, and pain.   ACTIVITY LIMITATIONS: carrying, lifting, squatting, bed mobility, reach over head, hygiene/grooming, and caring for others  PARTICIPATION LIMITATIONS: meal prep, cleaning, laundry, community activity, occupation, and yard work  PERSONAL FACTORS: Fitness and Time since onset of injury/illness/exacerbation are also affecting patient's functional  outcome.   REHAB POTENTIAL: Excellent  CLINICAL DECISION MAKING: Stable/uncomplicated  EVALUATION COMPLEXITY: Low   GOALS: Goals reviewed with patient? Yes  SHORT TERM GOALS: Target date: 09/26/2023   Patient will be independent in home exercise program to improve strength/mobility for better functional independence with ADLs. Baseline: initiated 4/4 Goal status: INITIAL   LONG TERM GOALS: Target date: 10/24/2023    Patient will increase mod ODI  score to equal to or greater than 5 points   to demonstrate statistically significant improvement in mobility and quality of life.  Baseline: to be completed Goal status: INITIAL  2.  Patient will tolerate sitting at work for >2 hours without pain and improved posture  Baseline: pain after 1 hour, with severe pain at 4 hours.  Goal status: INITIAL  3.  Patient will demonstrate ability to wash dishes without pain to improve function at home  Baseline: moderate pain while standing at sink Goal status: INITIAL  4.  Patient will return to running without pain to return to PLOF. Baseline: Pt unable to run since recent surgery   Goal status: INITIAL  5.  Patient will increase BUE strength to grossly 4+/5 to 5/5 to allow lifting up child at home without pain  Baseline: see UE MMT, with pain in abdomin  Goal status: INITIAL   PLAN:  PT FREQUENCY: 1-2x/week  PT DURATION: 8 weeks  PLANNED INTERVENTIONS: 97110-Therapeutic exercises, 97530- Therapeutic activity, 97112- Neuromuscular re-education, 97535- Self Care, [id number]- Manual therapy, [id number]- Aquatic Therapy, [id number]- Electrical stimulation (unattended), [id number]- Electrical stimulation (manual), [id number]- Vasopneumatic device, [id number]- Ultrasound, [id number]- Traction (mechanical), [id number]- Ionotophoresis 4mg /ml Dexamethasone, Patient/Family education, Balance training, Stair training, Taping, Dry Needling, Joint mobilization, Joint manipulation, Spinal manipulation, Spinal mobilization, Cryotherapy,  and Moist heat.  PLAN FOR NEXT SESSION:  Manual for pain management in Tspine and R scapula TDN Review/expand HEP.    Precious Bard, PT  09/01/2023, 9:45 AM

## 2023-09-02 NOTE — Therapy (Signed)
 OUTPATIENT PHYSICAL THERAPY THORACOLUMBAR TREATMENT   Patient Name: Kimberly Delgado MRN: [id number] DOB:Jan 23, 1986, 38 y.o., female Today's Date: 09/03/2023  END OF SESSION:  PT End of Session - 09/03/23 0718     Visit Number 3    Number of Visits 16    Date for PT Re-Evaluation 10/24/23    PT Start Time 0716    PT Stop Time 0759    PT Time Calculation (min) 43 min    Activity Tolerance Patient tolerated treatment well    Behavior During Therapy [facility] for tasks assessed/performed               Past Medical History:  Diagnosis Date   AMA (advanced maternal age) multigravida 35+    Anxiety    Depression    Exercise-induced asthma    per pt no inhaler   Hernia, umbilical    History of pyelonephritis    Incompetent cervix    12-23-2018  first trimester   PCOS (polycystic ovarian syndrome)    Seasonal allergies    Wears contact lenses    Past Surgical History:  Procedure Laterality Date   ABDOMINAL CERCLAGE N/A 12/25/2018   Procedure: TRANS CERCLAGE ABDOMINAL;  Surgeon: Fermin Schwab, MD;  Location: [facility];  Service: Gynecology;  Laterality: N/A;   CERVICAL CERCLAGE N/A 02/17/2015   Procedure: CERCLAGE CERVICAL;  Surgeon: Maxie Better, MD;  Location: [facility];  Service: Gynecology;  Laterality: N/A;  EDD: 08/16/15   CESAREAN SECTION WITH BILATERAL TUBAL LIGATION Bilateral 07/09/2019   Procedure: Primary CESAREAN SECTION WITH BILATERAL TUBAL LIGATION;  Surgeon: Maxie Better, MD;  Location: [facility];  Service: Obstetrics;  Laterality: Bilateral;  EDD: 07/14/19 Tracey RNFA   DILATATION & CURETTAGE/HYSTEROSCOPY WITH MYOSURE N/A 05/03/2022   Procedure: DILATATION & CURETTAGE/HYSTEROSCOPY WITH MYOSURE/NOVASURE ABLATION;  Surgeon: Maxie Better, MD;  Location: [facility];  Service: Gynecology;  Laterality: N/A;   WISDOM TOOTH EXTRACTION     Patient Active Problem List   Diagnosis Date Noted   Request for  sterilization 07/09/2019   Postpartum care following cesarean delivery 07/09/2019   SVD (spontaneous vaginal delivery) 08/12/2015   Postpartum care following vaginal delivery (3/18) 08/12/2015   Active labor at term 08/11/2015   Pregnancy 05/14/2015   Cervical incompetence @ 23 weeks 02/26/2013   Threatened preterm labor, antepartum 02/26/2013    PCP: Lorenda Ishihara, MD   REFERRING PROVIDER: Lorenda Ishihara, MD   REFERRING DIAG:  Diagnosis  M62.830 (ICD-10-CM) - Paraspinal muscle spasm    Rationale for Evaluation and Treatment: Rehabilitation  THERAPY DIAG:  Pain in thoracic spine  Chronic right shoulder pain  Shoulder weakness  ONSET DATE: >3 years   SUBJECTIVE:  SUBJECTIVE STATEMENT: Patient wearing ice pack over stitches. Responded well to needling  PERTINENT HISTORY:  Recent Umbilical hernia repair surgery. Will be on precautions until at least 4/15. Pt states that 3 years ago, she reports lifting car seat out of car. Felt like she pulled a muscle and has been hurting off and on since injury. Reports that core strengthening will help pain, but if she doesn't run or perform exercises for a couple days the pain becomes significant at night. Pain is worse after work and after washing dishes at sink in flexed posture.  Pt is an avid runner. Next 1/2 marathon will be in September in [place]   PAIN:  Are you having pain? Yes: NPRS scale: 1/10  Pain location: under the R shoulder blade   Pain description: at worst. Pain is aching/throbbing  Aggravating factors: bending over for long period of time at work Relieving factors: core exercise.   PRECAUTIONS: Other: hernia surgery 8 days ago   RED FLAGS: None   WEIGHT BEARING RESTRICTIONS:  no lifting >20lbs until 4/18    FALLS:  Has patient fallen in last 6 months? No  LIVING ENVIRONMENT: Lives with: lives with their family and lives with their spouse Lives in: House/apartment Stairs: Yes: Internal: 15 steps; on right going up and External: 1 steps; on right going up Has following equipment at home: None  OCCUPATION: Medical physists. In [employer]   PLOF: Independent  PATIENT GOALS: decrease back pain   NEXT MD VISIT: 4/15  OBJECTIVE:  Note: Objective measures were completed at Evaluation unless otherwise noted.  DIAGNOSTIC FINDINGS:  DG x ray:  IMPRESSION: Subtle levoscoliosis lumbar spine at the L2-L3 apex with minimal narrowing of the L5-S1 disc space.   PATIENT SURVEYS:  Modified Oswestry to be completed    COGNITION: Overall cognitive status: Within functional limits for tasks assessed     SENSATION: WFL  MUSCLE LENGTH:  POSTURE:  mild rounded shoulders.  PALPATION: Noted TP in the R side middle and lower traps as well as distal Lats and teres minor/major. Diffiultying palpating subscapularis due to lat tightness on the RLE  R scapula winging Asymetrical scapula movement with shoulder flexion and abduction   LUMBAR ROM:  Limited assessment due to recent umbilical hernia surgery  AROM eval  Flexion   Extension   Right lateral flexion   Left lateral flexion   Right rotation 25 deg   Left rotation 45 deg   (Blank rows = not tested)  LOWER EXTREMITY ROM:  WFL   Upper  EXTREMITY ROM:  Grossly WFL but noted mild asymmetry in R scapula .    Upper extremity  EXTREMITY MMT:    MMT Right eval Left eval  Shoulder extension *4 *4+  shoulder flexion *4+ *4+  shoulder abduction 4+ 4+  Shoulder adduction    Hip internal rotation 4+ 4+  Hip external rotation 4+ 4+  elbow flexion    Elbow extension    * Limited from pain abdomin  (Blank rows = not tested)  LUMBAR SPECIAL TESTS:  Recent Umbilical Hernia repair limits full assessment   GAIT: Distance  walked: 60 Assistive device utilized: None Level of assistance: Complete Independence Comments: noted mild shoulder elevation bil decreased trunkal rotation    TREATMENT DATE: 09/03/2023 Manual:  ER/IR PROM at full ROM x multiple minutes Cervical side bend with overpressure at [facility] joint and occiput 3x30 seconds  TherEx Cervical rotation with scapular retraction 10x each side GTB ER 15x  Scapular  retraction with Scap stabilization with overpressure: sidelying abduction 10x  Robber stretch 30 seconds TrA activating with hip rotation to 45 degrees and return to neutral  Scapular punches 10x; focus on scapular control  Trigger Point Dry Needling  Initial Treatment: Pt instructed on Dry Needling rational, procedures, and possible side effects. Pt instructed to expect mild to moderate muscle soreness later in the day and/or into the next day.  Pt instructed in methods to reduce muscle soreness. Pt instructed to continue prescribed HEP. Because Dry Needling was performed over or adjacent to a lung field, pt was educated on S/S of pneumothorax and to seek immediate medical attention should they occur.  Patient was educated on signs and symptoms of infection and other risk factors and advised to seek medical attention should they occur.  Patient verbalized understanding of these instructions and education.   Patient Verbal Consent Given: Yes Education Handout Provided: Yes Muscles Treated: R upper trap and subscap Electrical Stimulation Performed: No Treatment Response/Outcome: multiple twitch responses     PATIENT EDUCATION:  Education details: POC. TDN.  Person educated: Patient Education method: Explanation Education comprehension: verbalized understanding  HOME EXERCISE PROGRAM: Access Code: [email or web address] URL: [email or web address] Date: 08/29/2023 Prepared by: Grier Rocher  Exercises - Supine Shoulder External Rotation with Resistance  - 1 x daily - 5 x weekly - 3  sets - 10 reps - 3 hold - Hooklying Shoulder Y  - 1 x daily - 5 x weekly - 3 sets - 10 reps - 2 hold - Supine Isometric Shoulder Extension with Towel  - 1 x daily - 5 x weekly - 3 sets - 10 reps - 3 hold  Trigger Point Dry Needling  What is Trigger Point Dry Needling (DN)? DN is a physical therapy technique used to treat muscle pain and dysfunction. Specifically, DN helps deactivate muscle trigger points (muscle knots).  A thin filiform needle is used to penetrate the skin and stimulate the underlying trigger point. The goal is for a local twitch response (LTR) to occur and for the trigger point to relax. No medication of any kind is injected during the procedure.   What Does Trigger Point Dry Needling Feel Like?  The procedure feels different for each individual patient. Some patients report that they do not actually feel the needle enter the skin and overall the process is not painful. Very mild bleeding may occur. However, many patients feel a deep cramping in the muscle in which the needle was inserted. This is the local twitch response.   How Will I feel after the treatment? Soreness is normal, and the onset of soreness may not occur for a few hours. Typically this soreness does not last longer than two days.  Bruising is uncommon, however; ice can be used to decrease any possible bruising.  In rare cases feeling tired or nauseous after the treatment is normal. In addition, your symptoms may get worse before they get better, this period will typically not last longer than 24 hours.   What Can I do After My Treatment? Increase your hydration by drinking more water for the next 24 hours.  You may place ice or heat on the areas treated that have become sore, however, do not use heat on inflamed or bruised areas. Heat often brings more relief post needling. You can continue your regular activities, but vigorous activity is not recommended initially after the treatment for 24 hours. DN is best  combined with other physical therapy such as strengthening, stretching, and  other therapies.   What are the complications? While your therapist has had extensive training in minimizing the risks of trigger point dry needling, it is important to understand the risks of any procedure.  Risks include bleeding, pain, fatigue, hematoma, infection, vertigo, nausea or nerve involvement. Monitor for any changes to your skin or sensation. Contact your therapist or MD with concerns.  A rare but serious complication is a pneumothorax over or near your middle and upper chest and back If you have dry needling in this area, monitor for the following symptoms: Shortness of breath on exertion and/or Difficulty taking a deep breath and/or Chest Pain and/or A dry cough If any of the above symptoms develop, please go to the nearest emergency room or call 911. Tell them you had dry needling over your thorax and report any symptoms you are having. Please follow-up with your treating therapist after you complete the medical evaluation.   ASSESSMENT:  CLINICAL IMPRESSION: Patient tolerates progressive TDN with significant trigger points. Postural correction in cervical and lumbar spine tolerated well with excellent body control and spatial awareness. Patient is highly motivated throughout session, positions continue to be limited by surgery protocol.  Pt will benefit from skilled PT to address Tspine and R shoulder pain to allow improved QoL and return to PLOF.    OBJECTIVE IMPAIRMENTS: decreased strength, hypomobility, impaired perceived functional ability, improper body mechanics, postural dysfunction, and pain.   ACTIVITY LIMITATIONS: carrying, lifting, squatting, bed mobility, reach over head, hygiene/grooming, and caring for others  PARTICIPATION LIMITATIONS: meal prep, cleaning, laundry, community activity, occupation, and yard work  PERSONAL FACTORS: Fitness and Time since onset of  injury/illness/exacerbation are also affecting patient's functional outcome.   REHAB POTENTIAL: Excellent  CLINICAL DECISION MAKING: Stable/uncomplicated  EVALUATION COMPLEXITY: Low   GOALS: Goals reviewed with patient? Yes  SHORT TERM GOALS: Target date: 09/26/2023   Patient will be independent in home exercise program to improve strength/mobility for better functional independence with ADLs. Baseline: initiated 4/4 Goal status: INITIAL   LONG TERM GOALS: Target date: 10/24/2023    Patient will increase mod ODI  score to equal to or greater than 5 points   to demonstrate statistically significant improvement in mobility and quality of life.  Baseline: to be completed Goal status: INITIAL  2.  Patient will tolerate sitting at work for >2 hours without pain and improved posture  Baseline: pain after 1 hour, with severe pain at 4 hours.  Goal status: INITIAL  3.  Patient will demonstrate ability to wash dishes without pain to improve function at home  Baseline: moderate pain while standing at sink Goal status: INITIAL  4.  Patient will return to running without pain to return to PLOF. Baseline: Pt unable to run since recent surgery   Goal status: INITIAL  5.  Patient will increase BUE strength to grossly 4+/5 to 5/5 to allow lifting up child at home without pain  Baseline: see UE MMT, with pain in abdomin  Goal status: INITIAL   PLAN:  PT FREQUENCY: 1-2x/week  PT DURATION: 8 weeks  PLANNED INTERVENTIONS: 97110-Therapeutic exercises, 97530- Therapeutic activity, 97112- Neuromuscular re-education, 97535- Self Care, 62952- Manual therapy, [id number]- Aquatic Therapy, W4132- Electrical stimulation (unattended), [id number]- Electrical stimulation (manual), [id number]- Vasopneumatic device, [id number]- Ultrasound, [id number]- Traction (mechanical), [id number]- Ionotophoresis 4mg /ml Dexamethasone, Patient/Family education, Balance training, Stair training, Taping, Dry Needling, Joint mobilization, Joint  manipulation, Spinal manipulation, Spinal mobilization, Cryotherapy, and Moist heat.  PLAN FOR NEXT SESSION:  Manual for pain management in Tspine  and R scapula TDN Review/expand HEP.    Precious Bard, PT 09/03/2023, 8:06 AM

## 2023-09-03 ENCOUNTER — Ambulatory Visit

## 2023-09-03 DIAGNOSIS — R29898 Other symptoms and signs involving the musculoskeletal system: Secondary | ICD-10-CM

## 2023-09-03 DIAGNOSIS — M25511 Pain in right shoulder: Secondary | ICD-10-CM

## 2023-09-03 DIAGNOSIS — M546 Pain in thoracic spine: Secondary | ICD-10-CM

## 2023-09-08 ENCOUNTER — Ambulatory Visit

## 2023-09-08 NOTE — Therapy (Signed)
 OUTPATIENT PHYSICAL THERAPY THORACOLUMBAR TREATMENT   Patient Name: Kimberly Delgado MRN: [id number] DOB:12-09-1985, 38 y.o., female Today's Date: 09/09/2023  END OF SESSION:  PT End of Session - 09/09/23 0716     Visit Number 4    Number of Visits 16    Date for PT Re-Evaluation 10/24/23    PT Start Time 0715    PT Stop Time 0758    PT Time Calculation (min) 43 min    Activity Tolerance Patient tolerated treatment well    Behavior During Therapy [facility] for tasks assessed/performed                Past Medical History:  Diagnosis Date   AMA (advanced maternal age) multigravida 35+    Anxiety    Depression    Exercise-induced asthma    per pt no inhaler   Hernia, umbilical    History of pyelonephritis    Incompetent cervix    12-23-2018  first trimester   PCOS (polycystic ovarian syndrome)    Seasonal allergies    Wears contact lenses    Past Surgical History:  Procedure Laterality Date   ABDOMINAL CERCLAGE N/A 12/25/2018   Procedure: TRANS CERCLAGE ABDOMINAL;  Surgeon: Yalcinkaya, Tamer, MD;  Location: [facility];  Service: Gynecology;  Laterality: N/A;   CERVICAL CERCLAGE N/A 02/17/2015   Procedure: CERCLAGE CERVICAL;  Surgeon: Ivery Marking, MD;  Location: [facility];  Service: Gynecology;  Laterality: N/A;  EDD: 08/16/15   CESAREAN SECTION WITH BILATERAL TUBAL LIGATION Bilateral 07/09/2019   Procedure: Primary CESAREAN SECTION WITH BILATERAL TUBAL LIGATION;  Surgeon: Ivery Marking, MD;  Location: [facility];  Service: Obstetrics;  Laterality: Bilateral;  EDD: 07/14/19 Tracey RNFA   DILATATION & CURETTAGE/HYSTEROSCOPY WITH MYOSURE N/A 05/03/2022   Procedure: DILATATION & CURETTAGE/HYSTEROSCOPY WITH MYOSURE/NOVASURE ABLATION;  Surgeon: Ivery Marking, MD;  Location: [facility];  Service: Gynecology;  Laterality: N/A;   WISDOM TOOTH EXTRACTION     Patient Active Problem List   Diagnosis Date Noted   Request for  sterilization 07/09/2019   Postpartum care following cesarean delivery 07/09/2019   SVD (spontaneous vaginal delivery) 08/12/2015   Postpartum care following vaginal delivery (3/18) 08/12/2015   Active labor at term 08/11/2015   Pregnancy 05/14/2015   Cervical incompetence @ 23 weeks 02/26/2013   Threatened preterm labor, antepartum 02/26/2013    PCP: Arva Lathe, MD   REFERRING PROVIDER: Arva Lathe, MD   REFERRING DIAG:  Diagnosis  M62.830 (ICD-10-CM) - Paraspinal muscle spasm    Rationale for Evaluation and Treatment: Rehabilitation  THERAPY DIAG:  Pain in thoracic spine  Shoulder weakness  Chronic right shoulder pain  ONSET DATE: >3 years   SUBJECTIVE:  SUBJECTIVE STATEMENT: Patient reports her stomach feels better, can lay on her stomach now.   PERTINENT HISTORY:  Recent Umbilical hernia repair surgery. Will be on precautions until at least 4/15. Pt states that 3 years ago, she reports lifting car seat out of car. Felt like she pulled a muscle and has been hurting off and on since injury. Reports that core strengthening will help pain, but if she doesn't run or perform exercises for a couple days the pain becomes significant at night. Pain is worse after work and after washing dishes at sink in flexed posture.  Pt is an avid runner. Next 1/2 marathon will be in September in [place]   PAIN:  Are you having pain? Yes: NPRS scale: 1/10  Pain location: under the R shoulder blade   Pain description: at worst. Pain is aching/throbbing  Aggravating factors: bending over for long period of time at work Relieving factors: core exercise.   PRECAUTIONS: Other: hernia surgery 8 days ago   RED FLAGS: None   WEIGHT BEARING RESTRICTIONS:  no lifting >20lbs until 4/18    FALLS:  Has patient fallen in last 6 months? No  LIVING ENVIRONMENT: Lives with: lives with their family and lives with their spouse Lives in: House/apartment Stairs: Yes: Internal: 15 steps; on right going up and External: 1 steps; on right going up Has following equipment at home: None  OCCUPATION: Medical physists. In [employer]   PLOF: Independent  PATIENT GOALS: decrease back pain   NEXT MD VISIT: 4/15  OBJECTIVE:  Note: Objective measures were completed at Evaluation unless otherwise noted.  DIAGNOSTIC FINDINGS:  DG x ray:  IMPRESSION: Subtle levoscoliosis lumbar spine at the L2-L3 apex with minimal narrowing of the L5-S1 disc space.   PATIENT SURVEYS:  Modified Oswestry to be completed    COGNITION: Overall cognitive status: Within functional limits for tasks assessed     SENSATION: WFL  MUSCLE LENGTH:  POSTURE:  mild rounded shoulders.  PALPATION: Noted TP in the R side middle and lower traps as well as distal Lats and teres minor/major. Diffiultying palpating subscapularis due to lat tightness on the RLE  R scapula winging Asymetrical scapula movement with shoulder flexion and abduction   LUMBAR ROM:  Limited assessment due to recent umbilical hernia surgery  AROM eval  Flexion   Extension   Right lateral flexion   Left lateral flexion   Right rotation 25 deg   Left rotation 45 deg   (Blank rows = not tested)  LOWER EXTREMITY ROM:  WFL   Upper  EXTREMITY ROM:  Grossly WFL but noted mild asymmetry in R scapula .    Upper extremity  EXTREMITY MMT:    MMT Right eval Left eval  Shoulder extension *4 *4+  shoulder flexion *4+ *4+  shoulder abduction 4+ 4+  Shoulder adduction    Hip internal rotation 4+ 4+  Hip external rotation 4+ 4+  elbow flexion    Elbow extension    * Limited from pain abdomin  (Blank rows = not tested)  LUMBAR SPECIAL TESTS:  Recent Umbilical Hernia repair limits full assessment   GAIT: Distance  walked: 60 Assistive device utilized: None Level of assistance: Complete Independence Comments: noted mild shoulder elevation bil decreased trunkal rotation    TREATMENT DATE: 09/09/2023 Manual:  Grade II mobilizations thoracic spine x multiple reps J mobilization 3x30 seconds ER/IR PROM at full ROM x multiple minutes Cervical side bend with overpressure at [facility] joint and occiput 3x30 seconds  Suboccipital release 3x30 seconds  TherAct Open book stretch sidelying 10x each side, last rep 30 second hold  Half kneeling: thoracic rotation 10x    Trigger Point Dry Needling  Subsequent Treatment: Instructions provided previously at initial dry needling treatment.   Patient Verbal Consent Given: Yes Education Handout Provided: Previously Provided Muscles Treated: upper trap, R thoracic paraspinal  Electrical Stimulation Performed: No Treatment Response/Outcome: large twitch response, no adverse reaction     PATIENT EDUCATION:  Education details: POC. TDN.  Person educated: Patient Education method: Explanation Education comprehension: verbalized understanding  HOME EXERCISE PROGRAM: Access Code: [email or web address] URL: [email or web address] Date: 08/29/2023 Prepared by: Aurora Lees  Exercises - Supine Shoulder External Rotation with Resistance  - 1 x daily - 5 x weekly - 3 sets - 10 reps - 3 hold - Hooklying Shoulder Y  - 1 x daily - 5 x weekly - 3 sets - 10 reps - 2 hold - Supine Isometric Shoulder Extension with Towel  - 1 x daily - 5 x weekly - 3 sets - 10 reps - 3 hold  Trigger Point Dry Needling  What is Trigger Point Dry Needling (DN)? DN is a physical therapy technique used to treat muscle pain and dysfunction. Specifically, DN helps deactivate muscle trigger points (muscle knots).  A thin filiform needle is used to penetrate the skin and stimulate the underlying trigger point. The goal is for a local twitch response (LTR) to occur and for the trigger point to  relax. No medication of any kind is injected during the procedure.   What Does Trigger Point Dry Needling Feel Like?  The procedure feels different for each individual patient. Some patients report that they do not actually feel the needle enter the skin and overall the process is not painful. Very mild bleeding may occur. However, many patients feel a deep cramping in the muscle in which the needle was inserted. This is the local twitch response.   How Will I feel after the treatment? Soreness is normal, and the onset of soreness may not occur for a few hours. Typically this soreness does not last longer than two days.  Bruising is uncommon, however; ice can be used to decrease any possible bruising.  In rare cases feeling tired or nauseous after the treatment is normal. In addition, your symptoms may get worse before they get better, this period will typically not last longer than 24 hours.   What Can I do After My Treatment? Increase your hydration by drinking more water for the next 24 hours.  You may place ice or heat on the areas treated that have become sore, however, do not use heat on inflamed or bruised areas. Heat often brings more relief post needling. You can continue your regular activities, but vigorous activity is not recommended initially after the treatment for 24 hours. DN is best combined with other physical therapy such as strengthening, stretching, and other therapies.   What are the complications? While your therapist has had extensive training in minimizing the risks of trigger point dry needling, it is important to understand the risks of any procedure.  Risks include bleeding, pain, fatigue, hematoma, infection, vertigo, nausea or nerve involvement. Monitor for any changes to your skin or sensation. Contact your therapist or MD with concerns.  A rare but serious complication is a pneumothorax over or near your middle and upper chest and back If you have dry needling in  this area, monitor for the following symptoms: Shortness of breath on  exertion and/or Difficulty taking a deep breath and/or Chest Pain and/or A dry cough If any of the above symptoms develop, please go to the nearest emergency room or call 911. Tell them you had dry needling over your thorax and report any symptoms you are having. Please follow-up with your treating therapist after you complete the medical evaluation.   ASSESSMENT:  CLINICAL IMPRESSION: Patient able to tolerate prone position for first time allowing for thoracic mobilizations and needling of thoracic paraspinal . Patient reports she can take a full breath and move without thoracic pain for first time at end of session.  Pt will benefit from skilled PT to address Tspine and R shoulder pain to allow improved QoL and return to PLOF.    OBJECTIVE IMPAIRMENTS: decreased strength, hypomobility, impaired perceived functional ability, improper body mechanics, postural dysfunction, and pain.   ACTIVITY LIMITATIONS: carrying, lifting, squatting, bed mobility, reach over head, hygiene/grooming, and caring for others  PARTICIPATION LIMITATIONS: meal prep, cleaning, laundry, community activity, occupation, and yard work  PERSONAL FACTORS: Fitness and Time since onset of injury/illness/exacerbation are also affecting patient's functional outcome.   REHAB POTENTIAL: Excellent  CLINICAL DECISION MAKING: Stable/uncomplicated  EVALUATION COMPLEXITY: Low   GOALS: Goals reviewed with patient? Yes  SHORT TERM GOALS: Target date: 09/26/2023   Patient will be independent in home exercise program to improve strength/mobility for better functional independence with ADLs. Baseline: initiated 4/4 Goal status: INITIAL   LONG TERM GOALS: Target date: 10/24/2023    Patient will increase mod ODI  score to equal to or greater than 5 points   to demonstrate statistically significant improvement in mobility and quality of life.  Baseline: to  be completed Goal status: INITIAL  2.  Patient will tolerate sitting at work for >2 hours without pain and improved posture  Baseline: pain after 1 hour, with severe pain at 4 hours.  Goal status: INITIAL  3.  Patient will demonstrate ability to wash dishes without pain to improve function at home  Baseline: moderate pain while standing at sink Goal status: INITIAL  4.  Patient will return to running without pain to return to PLOF. Baseline: Pt unable to run since recent surgery   Goal status: INITIAL  5.  Patient will increase BUE strength to grossly 4+/5 to 5/5 to allow lifting up child at home without pain  Baseline: see UE MMT, with pain in abdomin  Goal status: INITIAL   PLAN:  PT FREQUENCY: 1-2x/week  PT DURATION: 8 weeks  PLANNED INTERVENTIONS: 97110-Therapeutic exercises, 97530- Therapeutic activity, 97112- Neuromuscular re-education, 97535- Self Care, 29528- Manual therapy, [id number]- Aquatic Therapy, [id number]- Electrical stimulation (unattended), [id number]- Electrical stimulation (manual), [id number]- Vasopneumatic device, [id number]- Ultrasound, [id number]- Traction (mechanical), [id number]- Ionotophoresis 4mg /ml Dexamethasone, Patient/Family education, Balance training, Stair training, Taping, Dry Needling, Joint mobilization, Joint manipulation, Spinal manipulation, Spinal mobilization, Cryotherapy, and Moist heat.  PLAN FOR NEXT SESSION:  Manual for pain management in Tspine and R scapula TDN Thoracic mobility    Jiovany Scheffel, PT 09/09/2023, 8:56 AM

## 2023-09-09 ENCOUNTER — Ambulatory Visit

## 2023-09-09 DIAGNOSIS — M546 Pain in thoracic spine: Secondary | ICD-10-CM | POA: Diagnosis not present

## 2023-09-09 DIAGNOSIS — G8929 Other chronic pain: Secondary | ICD-10-CM

## 2023-09-09 DIAGNOSIS — R29898 Other symptoms and signs involving the musculoskeletal system: Secondary | ICD-10-CM

## 2023-09-12 ENCOUNTER — Ambulatory Visit: Admitting: Physical Therapy

## 2023-09-12 DIAGNOSIS — G8929 Other chronic pain: Secondary | ICD-10-CM

## 2023-09-12 DIAGNOSIS — R29898 Other symptoms and signs involving the musculoskeletal system: Secondary | ICD-10-CM

## 2023-09-12 DIAGNOSIS — M546 Pain in thoracic spine: Secondary | ICD-10-CM

## 2023-09-12 NOTE — Therapy (Signed)
 OUTPATIENT PHYSICAL THERAPY THORACOLUMBAR TREATMENT   Patient Name: Kimberly Delgado MRN: [id number] DOB:1985/08/30, 38 y.o., female Today's Date: 09/12/2023  END OF SESSION:  PT End of Session - 09/12/23 0803     Visit Number 5    Number of Visits 16    Date for PT Re-Evaluation 10/24/23    PT Start Time 0805    PT Stop Time 0845    PT Time Calculation (min) 40 min    Activity Tolerance Patient tolerated treatment well    Behavior During Therapy [facility] for tasks assessed/performed                Past Medical History:  Diagnosis Date   AMA (advanced maternal age) multigravida 35+    Anxiety    Depression    Exercise-induced asthma    per pt no inhaler   Hernia, umbilical    History of pyelonephritis    Incompetent cervix    12-23-2018  first trimester   PCOS (polycystic ovarian syndrome)    Seasonal allergies    Wears contact lenses    Past Surgical History:  Procedure Laterality Date   ABDOMINAL CERCLAGE N/A 12/25/2018   Procedure: TRANS CERCLAGE ABDOMINAL;  Surgeon: Yalcinkaya, Tamer, MD;  Location: [facility];  Service: Gynecology;  Laterality: N/A;   CERVICAL CERCLAGE N/A 02/17/2015   Procedure: CERCLAGE CERVICAL;  Surgeon: Ivery Marking, MD;  Location: [facility];  Service: Gynecology;  Laterality: N/A;  EDD: 08/16/15   CESAREAN SECTION WITH BILATERAL TUBAL LIGATION Bilateral 07/09/2019   Procedure: Primary CESAREAN SECTION WITH BILATERAL TUBAL LIGATION;  Surgeon: Ivery Marking, MD;  Location: [facility];  Service: Obstetrics;  Laterality: Bilateral;  EDD: 07/14/19 Tracey RNFA   DILATATION & CURETTAGE/HYSTEROSCOPY WITH MYOSURE N/A 05/03/2022   Procedure: DILATATION & CURETTAGE/HYSTEROSCOPY WITH MYOSURE/NOVASURE ABLATION;  Surgeon: Ivery Marking, MD;  Location: [facility];  Service: Gynecology;  Laterality: N/A;   WISDOM TOOTH EXTRACTION     Patient Active Problem List   Diagnosis Date Noted   Request for  sterilization 07/09/2019   Postpartum care following cesarean delivery 07/09/2019   SVD (spontaneous vaginal delivery) 08/12/2015   Postpartum care following vaginal delivery (3/18) 08/12/2015   Active labor at term 08/11/2015   Pregnancy 05/14/2015   Cervical incompetence @ 23 weeks 02/26/2013   Threatened preterm labor, antepartum 02/26/2013    PCP: Arva Lathe, MD   REFERRING PROVIDER: Arva Lathe, MD   REFERRING DIAG:  Diagnosis  M62.830 (ICD-10-CM) - Paraspinal muscle spasm    Rationale for Evaluation and Treatment: Rehabilitation  THERAPY DIAG:  Pain in thoracic spine  Shoulder weakness  Chronic right shoulder pain  ONSET DATE: >3 years   SUBJECTIVE:  SUBJECTIVE STATEMENT: Patient continues to feel better overall, reports her stomach feels better, can lay on her stomach now. States that she is cleared for all mobility except straight crunch or running for at least a week.   PERTINENT HISTORY:  Recent Umbilical hernia repair surgery. Will be on precautions until at least 4/15. Pt states that 3 years ago, she reports lifting car seat out of car. Felt like she pulled a muscle and has been hurting off and on since injury. Reports that core strengthening will help pain, but if she doesn't run or perform exercises for a couple days the pain becomes significant at night. Pain is worse after work and after washing dishes at sink in flexed posture.  Pt is an avid runner. Next 1/2 marathon will be in September in [place]    PAIN:  Are you having pain? Yes: NPRS scale: 1/10  Pain location: under the R shoulder blade   Pain description: at worst. Pain is aching/throbbing  Aggravating factors: bending over for long period of time at work Relieving factors: core exercise.    PRECAUTIONS: Other: hernia surgery 8 days ago   RED FLAGS: None   WEIGHT BEARING RESTRICTIONS:  no lifting >20lbs until 4/18   FALLS:  Has patient fallen in last 6 months? No  LIVING ENVIRONMENT: Lives with: lives with their family and lives with their spouse Lives in: House/apartment Stairs: Yes: Internal: 15 steps; on right going up and External: 1 steps; on right going up Has following equipment at home: None  OCCUPATION: Medical physists. In [employer]   PLOF: Independent  PATIENT GOALS: decrease back pain   NEXT MD VISIT: 4/15  OBJECTIVE:  Note: Objective measures were completed at Evaluation unless otherwise noted.  DIAGNOSTIC FINDINGS:  DG x ray:  IMPRESSION: Subtle levoscoliosis lumbar spine at the L2-L3 apex with minimal narrowing of the L5-S1 disc space.   PATIENT SURVEYS:  Modified Oswestry to be completed    COGNITION: Overall cognitive status: Within functional limits for tasks assessed     SENSATION: WFL  MUSCLE LENGTH:  POSTURE:  mild rounded shoulders.  PALPATION: Noted TP in the R side middle and lower traps as well as distal Lats and teres minor/major. Diffiultying palpating subscapularis due to lat tightness on the RLE  R scapula winging Asymetrical scapula movement with shoulder flexion and abduction   LUMBAR ROM:  Limited assessment due to recent umbilical hernia surgery  AROM eval  Flexion   Extension   Right lateral flexion   Left lateral flexion   Right rotation 25 deg   Left rotation 45 deg   (Blank rows = not tested)  LOWER EXTREMITY ROM:  WFL   Upper  EXTREMITY ROM:  Grossly WFL but noted mild asymmetry in R scapula .    Upper extremity  EXTREMITY MMT:    MMT Right eval Left eval  Shoulder extension *4 *4+  shoulder flexion *4+ *4+  shoulder abduction 4+ 4+  Shoulder adduction    Hip internal rotation 4+ 4+  Hip external rotation 4+ 4+  elbow flexion    Elbow extension    * Limited from pain  abdomin  (Blank rows = not tested)  LUMBAR SPECIAL TESTS:  Recent Umbilical Hernia repair limits full assessment   GAIT: Distance walked: 60 Assistive device utilized: None Level of assistance: Complete Independence Comments: noted mild shoulder elevation bil decreased trunkal rotation    TREATMENT DATE: 09/12/2023 Manual:  STM to R distal Lat and teres major. With  TP release. TP release in Bil thoracic paraspinals and R mid trap  Suboccipital release 3x30 seconds Bil Cervical paraspinal and superior UT STM with TP release on the L side x 45 sec    TherEx  Open book stretch sidelying 12x each side, 5-10 sec hold Qped shoulder flexion with slight thoracic rotation x 8 bil  Supine rhythmic stabilization for shoulder ER/IR x x 1 min  RUE AROM shoulder ER/IR without compensations x 15  BUE Shoulder flexion x 12 with tactile cues for serratus activation and decreased UT activation  Supine shoulder adduction and scapular depression x 15 bil    Trigger Point Dry Needling  Subsequent Treatment: Instructions provided previously at initial dry needling treatment.   Patient Verbal Consent Given: Yes  Education Handout Provided: Previously Provided Muscles Treated: R distal Latisimus and teres major.  Electrical Stimulation Performed: No Treatment Response/Outcome: large twitch response, no adverse reaction     PATIENT EDUCATION:  Education details: POC. TDN.  Person educated: Patient Education method: Explanation Education comprehension: verbalized understanding  HOME EXERCISE PROGRAM: Access Code: [email or web address] URL: [email or web address] Date: 08/29/2023 Prepared by: Aurora Lees  Exercises - Supine Shoulder External Rotation with Resistance  - 1 x daily - 5 x weekly - 3 sets - 10 reps - 3 hold - Hooklying Shoulder Y  - 1 x daily - 5 x weekly - 3 sets - 10 reps - 2 hold - Supine Isometric Shoulder Extension with Towel  - 1 x daily - 5 x weekly - 3 sets - 10 reps -  3 hold  Trigger Point Dry Needling  What is Trigger Point Dry Needling (DN)? DN is a physical therapy technique used to treat muscle pain and dysfunction. Specifically, DN helps deactivate muscle trigger points (muscle knots).  A thin filiform needle is used to penetrate the skin and stimulate the underlying trigger point. The goal is for a local twitch response (LTR) to occur and for the trigger point to relax. No medication of any kind is injected during the procedure.   What Does Trigger Point Dry Needling Feel Like?  The procedure feels different for each individual patient. Some patients report that they do not actually feel the needle enter the skin and overall the process is not painful. Very mild bleeding may occur. However, many patients feel a deep cramping in the muscle in which the needle was inserted. This is the local twitch response.   How Will I feel after the treatment? Soreness is normal, and the onset of soreness may not occur for a few hours. Typically this soreness does not last longer than two days.  Bruising is uncommon, however; ice can be used to decrease any possible bruising.  In rare cases feeling tired or nauseous after the treatment is normal. In addition, your symptoms may get worse before they get better, this period will typically not last longer than 24 hours.   What Can I do After My Treatment? Increase your hydration by drinking more water  for the next 24 hours.  You may place ice or heat on the areas treated that have become sore, however, do not use heat on inflamed or bruised areas. Heat often brings more relief post needling. You can continue your regular activities, but vigorous activity is not recommended initially after the treatment for 24 hours. DN is best combined with other physical therapy such as strengthening, stretching, and other therapies.   What are the complications? While your therapist has had extensive training in minimizing  the risks  of trigger point dry needling, it is important to understand the risks of any procedure.  Risks include bleeding, pain, fatigue, hematoma, infection, vertigo, nausea or nerve involvement. Monitor for any changes to your skin or sensation. Contact your therapist or MD with concerns.  A rare but serious complication is a pneumothorax over or near your middle and upper chest and back If you have dry needling in this area, monitor for the following symptoms: Shortness of breath on exertion and/or Difficulty taking a deep breath and/or Chest Pain and/or A dry cough If any of the above symptoms develop, please go to the nearest emergency room or call 911. Tell them you had dry needling over your thorax and report any symptoms you are having. Please follow-up with your treating therapist after you complete the medical evaluation.   ASSESSMENT:  CLINICAL IMPRESSION: Patient able to tolerate qped for low trap and core stability training on this day. Continued POC to address reduced thoracic mobility and increased parascapular tension. TDN performed to L teres major/lats with large twitch response. Reports decreased tension upon completion of PT treatment. Pt will benefit from skilled PT to address Tspine and R shoulder pain to allow improved QoL and return to PLOF.    OBJECTIVE IMPAIRMENTS: decreased strength, hypomobility, impaired perceived functional ability, improper body mechanics, postural dysfunction, and pain.   ACTIVITY LIMITATIONS: carrying, lifting, squatting, bed mobility, reach over head, hygiene/grooming, and caring for others  PARTICIPATION LIMITATIONS: meal prep, cleaning, laundry, community activity, occupation, and yard work  PERSONAL FACTORS: Fitness and Time since onset of injury/illness/exacerbation are also affecting patient's functional outcome.   REHAB POTENTIAL: Excellent  CLINICAL DECISION MAKING: Stable/uncomplicated  EVALUATION COMPLEXITY: Low   GOALS: Goals  reviewed with patient? Yes  SHORT TERM GOALS: Target date: 09/26/2023   Patient will be independent in home exercise program to improve strength/mobility for better functional independence with ADLs. Baseline: initiated 4/4 Goal status: INITIAL   LONG TERM GOALS: Target date: 10/24/2023    Patient will increase mod ODI  score to equal to or greater than 5 points   to demonstrate statistically significant improvement in mobility and quality of life.  Baseline: to be completed Goal status: INITIAL  2.  Patient will tolerate sitting at work for >2 hours without pain and improved posture  Baseline: pain after 1 hour, with severe pain at 4 hours.  Goal status: INITIAL  3.  Patient will demonstrate ability to wash dishes without pain to improve function at home  Baseline: moderate pain while standing at sink Goal status: INITIAL  4.  Patient will return to running without pain to return to PLOF. Baseline: Pt unable to run since recent surgery   Goal status: INITIAL  5.  Patient will increase BUE strength to grossly 4+/5 to 5/5 to allow lifting up child at home without pain  Baseline: see UE MMT, with pain in abdomin  Goal status: INITIAL   PLAN:  PT FREQUENCY: 1-2x/week  PT DURATION: 8 weeks  PLANNED INTERVENTIONS: 97110-Therapeutic exercises, 97530- Therapeutic activity, 97112- Neuromuscular re-education, 97535- Self Care, [id number]- Manual therapy, [id number]- Aquatic Therapy, [id number]- Electrical stimulation (unattended), [id number]- Electrical stimulation (manual), [id number]- Vasopneumatic device, [id number]- Ultrasound, [id number]- Traction (mechanical), [id number]- Ionotophoresis 4mg /ml Dexamethasone , Patient/Family education, Balance training, Stair training, Taping, Dry Needling, Joint mobilization, Joint manipulation, Spinal manipulation, Spinal mobilization, Cryotherapy, and Moist heat.  PLAN FOR NEXT SESSION:  Continue POC Manual for pain management in Tspine and R scapula TDN, Thoracic mobility  Barbara Book, PT 09/12/2023, 8:04 AM

## 2023-09-13 NOTE — Therapy (Signed)
 OUTPATIENT PHYSICAL THERAPY THORACOLUMBAR TREATMENT   Patient Name: Kimberly Delgado MRN: [id number] DOB:03-05-1986, 38 y.o., female Today's Date: 09/15/2023  END OF SESSION:  PT End of Session - 09/15/23 0715     Visit Number 6    Number of Visits 16    Date for PT Re-Evaluation 10/24/23    PT Start Time 0715    PT Stop Time 0759    PT Time Calculation (min) 44 min    Activity Tolerance Patient tolerated treatment well    Behavior During Therapy [facility] for tasks assessed/performed                 Past Medical History:  Diagnosis Date   AMA (advanced maternal age) multigravida 35+    Anxiety    Depression    Exercise-induced asthma    per pt no inhaler   Hernia, umbilical    History of pyelonephritis    Incompetent cervix    12-23-2018  first trimester   PCOS (polycystic ovarian syndrome)    Seasonal allergies    Wears contact lenses    Past Surgical History:  Procedure Laterality Date   ABDOMINAL CERCLAGE N/A 12/25/2018   Procedure: TRANS CERCLAGE ABDOMINAL;  Surgeon: Yalcinkaya, Tamer, MD;  Location: [facility];  Service: Gynecology;  Laterality: N/A;   CERVICAL CERCLAGE N/A 02/17/2015   Procedure: CERCLAGE CERVICAL;  Surgeon: Ivery Marking, MD;  Location: [facility];  Service: Gynecology;  Laterality: N/A;  EDD: 08/16/15   CESAREAN SECTION WITH BILATERAL TUBAL LIGATION Bilateral 07/09/2019   Procedure: Primary CESAREAN SECTION WITH BILATERAL TUBAL LIGATION;  Surgeon: Ivery Marking, MD;  Location: [facility];  Service: Obstetrics;  Laterality: Bilateral;  EDD: 07/14/19 Tracey RNFA   DILATATION & CURETTAGE/HYSTEROSCOPY WITH MYOSURE N/A 05/03/2022   Procedure: DILATATION & CURETTAGE/HYSTEROSCOPY WITH MYOSURE/NOVASURE ABLATION;  Surgeon: Ivery Marking, MD;  Location: [facility];  Service: Gynecology;  Laterality: N/A;   WISDOM TOOTH EXTRACTION     Patient Active Problem List   Diagnosis Date Noted   Request for  sterilization 07/09/2019   Postpartum care following cesarean delivery 07/09/2019   SVD (spontaneous vaginal delivery) 08/12/2015   Postpartum care following vaginal delivery (3/18) 08/12/2015   Active labor at term 08/11/2015   Pregnancy 05/14/2015   Cervical incompetence @ 23 weeks 02/26/2013   Threatened preterm labor, antepartum 02/26/2013    PCP: Arva Lathe, MD   REFERRING PROVIDER: Arva Lathe, MD   REFERRING DIAG:  Diagnosis  M62.830 (ICD-10-CM) - Paraspinal muscle spasm    Rationale for Evaluation and Treatment: Rehabilitation  THERAPY DIAG:  Pain in thoracic spine  Shoulder weakness  Chronic right shoulder pain  ONSET DATE: >3 years   SUBJECTIVE:  SUBJECTIVE STATEMENT: Patient reports feeling better after last session but continues to have deep back pain.   PERTINENT HISTORY:  Recent Umbilical hernia repair surgery. Will be on precautions until at least 4/15. Pt states that 3 years ago, she reports lifting car seat out of car. Felt like she pulled a muscle and has been hurting off and on since injury. Reports that core strengthening will help pain, but if she doesn't run or perform exercises for a couple days the pain becomes significant at night. Pain is worse after work and after washing dishes at sink in flexed posture.  Pt is an avid runner. Next 1/2 marathon will be in September in [place]    PAIN:  Are you having pain? Yes: NPRS scale: 1/10  Pain location: under the R shoulder blade   Pain description: at worst. Pain is aching/throbbing  Aggravating factors: bending over for long period of time at work Relieving factors: core exercise.   PRECAUTIONS: Other: hernia surgery 8 days ago   RED FLAGS: None   WEIGHT BEARING RESTRICTIONS:  no lifting  >20lbs until 4/18   FALLS:  Has patient fallen in last 6 months? No  LIVING ENVIRONMENT: Lives with: lives with their family and lives with their spouse Lives in: House/apartment Stairs: Yes: Internal: 15 steps; on right going up and External: 1 steps; on right going up Has following equipment at home: None  OCCUPATION: Medical physists. In [employer]   PLOF: Independent  PATIENT GOALS: decrease back pain   NEXT MD VISIT: 4/15  OBJECTIVE:  Note: Objective measures were completed at Evaluation unless otherwise noted.  DIAGNOSTIC FINDINGS:  DG x ray:  IMPRESSION: Subtle levoscoliosis lumbar spine at the L2-L3 apex with minimal narrowing of the L5-S1 disc space.   PATIENT SURVEYS:  Modified Oswestry to be completed    COGNITION: Overall cognitive status: Within functional limits for tasks assessed     SENSATION: WFL  MUSCLE LENGTH:  POSTURE:  mild rounded shoulders.  PALPATION: Noted TP in the R side middle and lower traps as well as distal Lats and teres minor/major. Diffiultying palpating subscapularis due to lat tightness on the RLE  R scapula winging Asymetrical scapula movement with shoulder flexion and abduction   LUMBAR ROM:  Limited assessment due to recent umbilical hernia surgery  AROM eval  Flexion   Extension   Right lateral flexion   Left lateral flexion   Right rotation 25 deg   Left rotation 45 deg   (Blank rows = not tested)  LOWER EXTREMITY ROM:  WFL   Upper  EXTREMITY ROM:  Grossly WFL but noted mild asymmetry in R scapula .    Upper extremity  EXTREMITY MMT:    MMT Right eval Left eval  Shoulder extension *4 *4+  shoulder flexion *4+ *4+  shoulder abduction 4+ 4+  Shoulder adduction    Hip internal rotation 4+ 4+  Hip external rotation 4+ 4+  elbow flexion    Elbow extension    * Limited from pain abdomin  (Blank rows = not tested)  LUMBAR SPECIAL TESTS:  Recent Umbilical Hernia repair limits full assessment    GAIT: Distance walked: 60 Assistive device utilized: None Level of assistance: Complete Independence Comments: noted mild shoulder elevation bil decreased trunkal rotation    TREATMENT DATE: 09/15/2023 Manual:  STM to intercostal region of L ribs and lat x 18 minutes    TherEx  Deep breathing technique for rib expansion 20x  ABC on wall each UE  Wall posture 3x positional hold   Trigger Point Dry Needling  Subsequent Treatment: Instructions provided previously at initial dry needling treatment.   Patient Verbal Consent Given: Yes  Education Handout Provided: Previously Provided Muscles Treated: R distal Latisimus and subscap,   Electrical Stimulation Performed: No Treatment Response/Outcome: large twitch response, no adverse reaction     PATIENT EDUCATION:  Education details: POC. TDN.  Person educated: Patient Education method: Explanation Education comprehension: verbalized understanding  HOME EXERCISE PROGRAM: Access Code: [email or web address] URL: [email or web address] Date: 08/29/2023 Prepared by: Aurora Lees  Exercises - Supine Shoulder External Rotation with Resistance  - 1 x daily - 5 x weekly - 3 sets - 10 reps - 3 hold - Hooklying Shoulder Y  - 1 x daily - 5 x weekly - 3 sets - 10 reps - 2 hold - Supine Isometric Shoulder Extension with Towel  - 1 x daily - 5 x weekly - 3 sets - 10 reps - 3 hold  Trigger Point Dry Needling  What is Trigger Point Dry Needling (DN)? DN is a physical therapy technique used to treat muscle pain and dysfunction. Specifically, DN helps deactivate muscle trigger points (muscle knots).  A thin filiform needle is used to penetrate the skin and stimulate the underlying trigger point. The goal is for a local twitch response (LTR) to occur and for the trigger point to relax. No medication of any kind is injected during the procedure.   What Does Trigger Point Dry Needling Feel Like?  The procedure feels different for each  individual patient. Some patients report that they do not actually feel the needle enter the skin and overall the process is not painful. Very mild bleeding may occur. However, many patients feel a deep cramping in the muscle in which the needle was inserted. This is the local twitch response.   How Will I feel after the treatment? Soreness is normal, and the onset of soreness may not occur for a few hours. Typically this soreness does not last longer than two days.  Bruising is uncommon, however; ice can be used to decrease any possible bruising.  In rare cases feeling tired or nauseous after the treatment is normal. In addition, your symptoms may get worse before they get better, this period will typically not last longer than 24 hours.   What Can I do After My Treatment? Increase your hydration by drinking more water  for the next 24 hours.  You may place ice or heat on the areas treated that have become sore, however, do not use heat on inflamed or bruised areas. Heat often brings more relief post needling. You can continue your regular activities, but vigorous activity is not recommended initially after the treatment for 24 hours. DN is best combined with other physical therapy such as strengthening, stretching, and other therapies.   What are the complications? While your therapist has had extensive training in minimizing the risks of trigger point dry needling, it is important to understand the risks of any procedure.  Risks include bleeding, pain, fatigue, hematoma, infection, vertigo, nausea or nerve involvement. Monitor for any changes to your skin or sensation. Contact your therapist or MD with concerns.  A rare but serious complication is a pneumothorax over or near your middle and upper chest and back If you have dry needling in this area, monitor for the following symptoms: Shortness of breath on exertion and/or Difficulty taking a deep breath and/or Chest Pain and/or A dry  cough If any of the  above symptoms develop, please go to the nearest emergency room or call 911. Tell them you had dry needling over your thorax and report any symptoms you are having. Please follow-up with your treating therapist after you complete the medical evaluation.   ASSESSMENT:  CLINICAL IMPRESSION: Extensive soft tissue work between intercostal region released scar tissue and improved expansion with breathing. Breathing technique for full rib expansion and reduction of compensatory elevation of scapula tolerated well but is initially challenging for muscular contraction.  Pt will benefit from skilled PT to address Tspine and R shoulder pain to allow improved QoL and return to PLOF.    OBJECTIVE IMPAIRMENTS: decreased strength, hypomobility, impaired perceived functional ability, improper body mechanics, postural dysfunction, and pain.   ACTIVITY LIMITATIONS: carrying, lifting, squatting, bed mobility, reach over head, hygiene/grooming, and caring for others  PARTICIPATION LIMITATIONS: meal prep, cleaning, laundry, community activity, occupation, and yard work  PERSONAL FACTORS: Fitness and Time since onset of injury/illness/exacerbation are also affecting patient's functional outcome.   REHAB POTENTIAL: Excellent  CLINICAL DECISION MAKING: Stable/uncomplicated  EVALUATION COMPLEXITY: Low   GOALS: Goals reviewed with patient? Yes  SHORT TERM GOALS: Target date: 09/26/2023   Patient will be independent in home exercise program to improve strength/mobility for better functional independence with ADLs. Baseline: initiated 4/4 Goal status: INITIAL   LONG TERM GOALS: Target date: 10/24/2023    Patient will increase mod ODI  score to equal to or greater than 5 points   to demonstrate statistically significant improvement in mobility and quality of life.  Baseline: to be completed Goal status: INITIAL  2.  Patient will tolerate sitting at work for >2 hours without pain and  improved posture  Baseline: pain after 1 hour, with severe pain at 4 hours.  Goal status: INITIAL  3.  Patient will demonstrate ability to wash dishes without pain to improve function at home  Baseline: moderate pain while standing at sink Goal status: INITIAL  4.  Patient will return to running without pain to return to PLOF. Baseline: Pt unable to run since recent surgery   Goal status: INITIAL  5.  Patient will increase BUE strength to grossly 4+/5 to 5/5 to allow lifting up child at home without pain  Baseline: see UE MMT, with pain in abdomin  Goal status: INITIAL   PLAN:  PT FREQUENCY: 1-2x/week  PT DURATION: 8 weeks  PLANNED INTERVENTIONS: 97110-Therapeutic exercises, 97530- Therapeutic activity, 97112- Neuromuscular re-education, 97535- Self Care, [id number]- Manual therapy, [id number]- Aquatic Therapy, [id number]- Electrical stimulation (unattended), [id number]- Electrical stimulation (manual), [id number]- Vasopneumatic device, [id number]- Ultrasound, [id number]- Traction (mechanical), [id number]- Ionotophoresis 4mg /ml Dexamethasone , Patient/Family education, Balance training, Stair training, Taping, Dry Needling, Joint mobilization, Joint manipulation, Spinal manipulation, Spinal mobilization, Cryotherapy, and Moist heat.  PLAN FOR NEXT SESSION:  Continue POC Manual for pain management in Tspine and R scapula TDN, Thoracic mobility    Nyshawn Gowdy, PT 09/15/2023, 8:00 AM

## 2023-09-15 ENCOUNTER — Ambulatory Visit
Admission: RE | Admit: 2023-09-15 | Discharge: 2023-09-15 | Disposition: A | Discharge status: Home / Self Care | Payer: Commercial Managed Care - PPO | Source: Ambulatory Visit | Attending: Internal Medicine | Admitting: Internal Medicine

## 2023-09-15 ENCOUNTER — Ambulatory Visit

## 2023-09-15 DIAGNOSIS — M546 Pain in thoracic spine: Secondary | ICD-10-CM

## 2023-09-15 DIAGNOSIS — N6310 Unspecified lump in the right breast, unspecified quadrant: Secondary | ICD-10-CM

## 2023-09-15 DIAGNOSIS — G8929 Other chronic pain: Secondary | ICD-10-CM

## 2023-09-15 DIAGNOSIS — R29898 Other symptoms and signs involving the musculoskeletal system: Secondary | ICD-10-CM

## 2023-09-16 ENCOUNTER — Ambulatory Visit

## 2023-09-17 NOTE — Therapy (Signed)
 OUTPATIENT PHYSICAL THERAPY THORACOLUMBAR TREATMENT   Patient Name: Kimberly Delgado MRN: [id number] DOB:November 29, 1985, 38 y.o., female Today's Date: 09/18/2023  END OF SESSION:  PT End of Session - 09/18/23 0716     Visit Number 7    Number of Visits 16    Date for PT Re-Evaluation 10/24/23    PT Start Time 0715    PT Stop Time 0758    PT Time Calculation (min) 43 min    Activity Tolerance Patient tolerated treatment well    Behavior During Therapy [facility] for tasks assessed/performed                  Past Medical History:  Diagnosis Date   AMA (advanced maternal age) multigravida 35+    Anxiety    Depression    Exercise-induced asthma    per pt no inhaler   Hernia, umbilical    History of pyelonephritis    Incompetent cervix    12-23-2018  first trimester   PCOS (polycystic ovarian syndrome)    Seasonal allergies    Wears contact lenses    Past Surgical History:  Procedure Laterality Date   ABDOMINAL CERCLAGE N/A 12/25/2018   Procedure: TRANS CERCLAGE ABDOMINAL;  Surgeon: Yalcinkaya, Tamer, MD;  Location: [facility];  Service: Gynecology;  Laterality: N/A;   CERVICAL CERCLAGE N/A 02/17/2015   Procedure: CERCLAGE CERVICAL;  Surgeon: Ivery Marking, MD;  Location: [facility];  Service: Gynecology;  Laterality: N/A;  EDD: 08/16/15   CESAREAN SECTION WITH BILATERAL TUBAL LIGATION Bilateral 07/09/2019   Procedure: Primary CESAREAN SECTION WITH BILATERAL TUBAL LIGATION;  Surgeon: Ivery Marking, MD;  Location: [facility];  Service: Obstetrics;  Laterality: Bilateral;  EDD: 07/14/19 Tracey RNFA   DILATATION & CURETTAGE/HYSTEROSCOPY WITH MYOSURE N/A 05/03/2022   Procedure: DILATATION & CURETTAGE/HYSTEROSCOPY WITH MYOSURE/NOVASURE ABLATION;  Surgeon: Ivery Marking, MD;  Location:  [facility];  Service: Gynecology;  Laterality: N/A;   WISDOM TOOTH EXTRACTION     Patient Active Problem List   Diagnosis Date Noted   Request for  sterilization 07/09/2019   Postpartum care following cesarean delivery 07/09/2019   SVD (spontaneous vaginal delivery) 08/12/2015   Postpartum care following vaginal delivery (3/18) 08/12/2015   Active labor at term 08/11/2015   Pregnancy 05/14/2015   Cervical incompetence @ 23 weeks 02/26/2013   Threatened preterm labor, antepartum 02/26/2013    PCP: Arva Lathe, MD   REFERRING PROVIDER: Arva Lathe, MD   REFERRING DIAG:  Diagnosis  M62.830 (ICD-10-CM) - Paraspinal muscle spasm    Rationale for Evaluation and Treatment: Rehabilitation  THERAPY DIAG:  Pain in thoracic spine  Shoulder weakness  Chronic right shoulder pain  ONSET DATE: >3 years   SUBJECTIVE:  SUBJECTIVE STATEMENT: Patient reports she feels more even  between sides but is now experiencing some recoil pain from her body resisting the change.   PERTINENT HISTORY:  Recent Umbilical hernia repair surgery. Will be on precautions until at least 4/15. Pt states that 3 years ago, she reports lifting car seat out of car. Felt like she pulled a muscle and has been hurting off and on since injury. Reports that core strengthening will help pain, but if she doesn't run or perform exercises for a couple days the pain becomes significant at night. Pain is worse after work and after washing dishes at sink in flexed posture.  Pt is an avid runner. Next 1/2 marathon will be in September in [place]    PAIN:  Are you having pain? Yes: NPRS scale: 1/10  Pain location: under the R shoulder blade   Pain description: at worst. Pain is aching/throbbing  Aggravating factors: bending over for long period of time at work Relieving factors: core exercise.   PRECAUTIONS: Other: hernia surgery 8 days ago   RED  FLAGS: None   WEIGHT BEARING RESTRICTIONS:  no lifting >20lbs until 4/18   FALLS:  Has patient fallen in last 6 months? No  LIVING ENVIRONMENT: Lives with: lives with their family and lives with their spouse Lives in: House/apartment Stairs: Yes: Internal: 15 steps; on right going up and External: 1 steps; on right going up Has following equipment at home: None  OCCUPATION: Medical physists. In [employer]   PLOF: Independent  PATIENT GOALS: decrease back pain   NEXT MD VISIT: 4/15  OBJECTIVE:  Note: Objective measures were completed at Evaluation unless otherwise noted.  DIAGNOSTIC FINDINGS:  DG x ray:  IMPRESSION: Subtle levoscoliosis lumbar spine at the L2-L3 apex with minimal narrowing of the L5-S1 disc space.   PATIENT SURVEYS:  Modified Oswestry to be completed    COGNITION: Overall cognitive status: Within functional limits for tasks assessed     SENSATION: WFL  MUSCLE LENGTH:  POSTURE:  mild rounded shoulders.  PALPATION: Noted TP in the R side middle and lower traps as well as distal Lats and teres minor/major. Diffiultying palpating subscapularis due to lat tightness on the RLE  R scapula winging Asymetrical scapula movement with shoulder flexion and abduction   LUMBAR ROM:  Limited assessment due to recent umbilical hernia surgery  AROM eval  Flexion   Extension   Right lateral flexion   Left lateral flexion   Right rotation 25 deg   Left rotation 45 deg   (Blank rows = not tested)  LOWER EXTREMITY ROM:  WFL   Upper  EXTREMITY ROM:  Grossly WFL but noted mild asymmetry in R scapula .    Upper extremity  EXTREMITY MMT:    MMT Right eval Left eval  Shoulder extension *4 *4+  shoulder flexion *4+ *4+  shoulder abduction 4+ 4+  Shoulder adduction    Hip internal rotation 4+ 4+  Hip external rotation 4+ 4+  elbow flexion    Elbow extension    * Limited from pain abdomin  (Blank rows = not tested)  LUMBAR SPECIAL TESTS:   Recent Umbilical Hernia repair limits full assessment   GAIT: Distance walked: 60 Assistive device utilized: None Level of assistance: Complete Independence Comments: noted mild shoulder elevation bil decreased trunkal rotation    TREATMENT DATE: 09/18/2023 Manual:  STM to intercostal region of L ribs and lat x 20 minutes; implementation of effleurage and pettrisage for lat musculature and trigger  point ischemic holds to intercostal region Grade II thoracic mobilizations x multiple minutes Suboccipital release 30 seconds x 3 trials   TherEx  2.5 weight matrix machine half kneeling rows 12x each UE  Trigger Point Dry Needling  Subsequent Treatment: Instructions provided previously at initial dry needling treatment.   Patient Verbal Consent Given: Yes  Education Handout Provided: Previously Provided Muscles Treated: bilateral upper trap, L lumbar paraspinal   Electrical Stimulation Performed: No Treatment Response/Outcome: large twitch response, no adverse reaction     PATIENT EDUCATION:  Education details: POC. TDN.  Person educated: Patient Education method: Explanation Education comprehension: verbalized understanding  HOME EXERCISE PROGRAM: Access Code: [email or web address] URL: [email or web address] Date: 08/29/2023 Prepared by: Aurora Lees  Exercises - Supine Shoulder External Rotation with Resistance  - 1 x daily - 5 x weekly - 3 sets - 10 reps - 3 hold - Hooklying Shoulder Y  - 1 x daily - 5 x weekly - 3 sets - 10 reps - 2 hold - Supine Isometric Shoulder Extension with Towel  - 1 x daily - 5 x weekly - 3 sets - 10 reps - 3 hold  Trigger Point Dry Needling  What is Trigger Point Dry Needling (DN)? DN is a physical therapy technique used to treat muscle pain and dysfunction. Specifically, DN helps deactivate muscle trigger points (muscle knots).  A thin filiform needle is used to penetrate the skin and stimulate the underlying trigger point. The goal is for  a local twitch response (LTR) to occur and for the trigger point to relax. No medication of any kind is injected during the procedure.   What Does Trigger Point Dry Needling Feel Like?  The procedure feels different for each individual patient. Some patients report that they do not actually feel the needle enter the skin and overall the process is not painful. Very mild bleeding may occur. However, many patients feel a deep cramping in the muscle in which the needle was inserted. This is the local twitch response.   How Will I feel after the treatment? Soreness is normal, and the onset of soreness may not occur for a few hours. Typically this soreness does not last longer than two days.  Bruising is uncommon, however; ice can be used to decrease any possible bruising.  In rare cases feeling tired or nauseous after the treatment is normal. In addition, your symptoms may get worse before they get better, this period will typically not last longer than 24 hours.   What Can I do After My Treatment? Increase your hydration by drinking more water  for the next 24 hours.  You may place ice or heat on the areas treated that have become sore, however, do not use heat on inflamed or bruised areas. Heat often brings more relief post needling. You can continue your regular activities, but vigorous activity is not recommended initially after the treatment for 24 hours. DN is best combined with other physical therapy such as strengthening, stretching, and other therapies.   What are the complications? While your therapist has had extensive training in minimizing the risks of trigger point dry needling, it is important to understand the risks of any procedure.  Risks include bleeding, pain, fatigue, hematoma, infection, vertigo, nausea or nerve involvement. Monitor for any changes to your skin or sensation. Contact your therapist or MD with concerns.  A rare but serious complication is a pneumothorax over or near  your middle and upper chest and back If you have dry needling in  this area, monitor for the following symptoms: Shortness of breath on exertion and/or Difficulty taking a deep breath and/or Chest Pain and/or A dry cough If any of the above symptoms develop, please go to the nearest emergency room or call 911. Tell them you had dry needling over your thorax and report any symptoms you are having. Please follow-up with your treating therapist after you complete the medical evaluation.   ASSESSMENT:  CLINICAL IMPRESSION: Patient to bring running shoes next session to allow for analysis of running form. Patient agreeable to plan. Extensive soft tissue release reduced tension allowing for a more neutral spinal alignment at this time.   Pt will benefit from skilled PT to address Tspine and R shoulder pain to allow improved QoL and return to PLOF.    OBJECTIVE IMPAIRMENTS: decreased strength, hypomobility, impaired perceived functional ability, improper body mechanics, postural dysfunction, and pain.   ACTIVITY LIMITATIONS: carrying, lifting, squatting, bed mobility, reach over head, hygiene/grooming, and caring for others  PARTICIPATION LIMITATIONS: meal prep, cleaning, laundry, community activity, occupation, and yard work  PERSONAL FACTORS: Fitness and Time since onset of injury/illness/exacerbation are also affecting patient's functional outcome.   REHAB POTENTIAL: Excellent  CLINICAL DECISION MAKING: Stable/uncomplicated  EVALUATION COMPLEXITY: Low   GOALS: Goals reviewed with patient? Yes  SHORT TERM GOALS: Target date: 09/26/2023   Patient will be independent in home exercise program to improve strength/mobility for better functional independence with ADLs. Baseline: initiated 4/4 Goal status: INITIAL   LONG TERM GOALS: Target date: 10/24/2023    Patient will increase mod ODI  score to equal to or greater than 5 points   to demonstrate statistically significant improvement in  mobility and quality of life.  Baseline: to be completed Goal status: INITIAL  2.  Patient will tolerate sitting at work for >2 hours without pain and improved posture  Baseline: pain after 1 hour, with severe pain at 4 hours.  Goal status: INITIAL  3.  Patient will demonstrate ability to wash dishes without pain to improve function at home  Baseline: moderate pain while standing at sink Goal status: INITIAL  4.  Patient will return to running without pain to return to PLOF. Baseline: Pt unable to run since recent surgery   Goal status: INITIAL  5.  Patient will increase BUE strength to grossly 4+/5 to 5/5 to allow lifting up child at home without pain  Baseline: see UE MMT, with pain in abdomin  Goal status: INITIAL   PLAN:  PT FREQUENCY: 1-2x/week  PT DURATION: 8 weeks  PLANNED INTERVENTIONS: 97110-Therapeutic exercises, 97530- Therapeutic activity, 97112- Neuromuscular re-education, 97535- Self Care, 09811- Manual therapy, [id number]- Aquatic Therapy, B1478- Electrical stimulation (unattended), [id number]- Electrical stimulation (manual), [id number]- Vasopneumatic device, [id number]- Ultrasound, [id number]- Traction (mechanical), [id number]- Ionotophoresis 4mg /ml Dexamethasone , Patient/Family education, Balance training, Stair training, Taping, Dry Needling, Joint mobilization, Joint manipulation, Spinal manipulation, Spinal mobilization, Cryotherapy, and Moist heat.  PLAN FOR NEXT SESSION:   Manual for pain management in Tspine and R scapula TDN, Thoracic mobility  Running analysis    Kirbi Farrugia, PT 09/18/2023, 9:31 AM

## 2023-09-18 ENCOUNTER — Ambulatory Visit

## 2023-09-18 DIAGNOSIS — R29898 Other symptoms and signs involving the musculoskeletal system: Secondary | ICD-10-CM

## 2023-09-18 DIAGNOSIS — G8929 Other chronic pain: Secondary | ICD-10-CM

## 2023-09-18 DIAGNOSIS — M546 Pain in thoracic spine: Secondary | ICD-10-CM | POA: Diagnosis not present

## 2023-09-23 ENCOUNTER — Ambulatory Visit

## 2023-09-24 NOTE — Therapy (Signed)
 OUTPATIENT PHYSICAL THERAPY THORACOLUMBAR TREATMENT   Patient Name: Kimberly Delgado MRN: [id number] DOB:[birth year], 38 y.o., female Today's Date: 09/25/2023  END OF SESSION:  PT End of Session - 09/25/23 0716     Visit Number 8    Number of Visits 16    Date for PT Re-Evaluation 10/24/23    PT Start Time 0715    PT Stop Time 0759    PT Time Calculation (min) 44 min    Activity Tolerance Patient tolerated treatment well    Behavior During Therapy [facility] for tasks assessed/performed                   Past Medical History:  Diagnosis Date   AMA (advanced maternal age) multigravida 35+    Anxiety    Depression    Exercise-induced asthma    per pt no inhaler   Hernia, umbilical    History of pyelonephritis    Incompetent cervix    12-23-2018  first trimester   PCOS (polycystic ovarian syndrome)    Seasonal allergies    Wears contact lenses    Past Surgical History:  Procedure Laterality Date   ABDOMINAL CERCLAGE N/A 12/25/2018   Procedure: TRANS CERCLAGE ABDOMINAL;  Surgeon: Yalcinkaya, Tamer, MD;  Location: [facility];  Service: Gynecology;  Laterality: N/A;   CERVICAL CERCLAGE N/A 02/17/2015   Procedure: CERCLAGE CERVICAL;  Surgeon: Ivery Marking, MD;  Location: [facility];  Service: Gynecology;  Laterality: N/A;  EDD: 08/16/15   CESAREAN SECTION WITH BILATERAL TUBAL LIGATION Bilateral 07/09/2019   Procedure: Primary CESAREAN SECTION WITH BILATERAL TUBAL LIGATION;  Surgeon: Ivery Marking, MD;  Location: [facility];  Service: Obstetrics;  Laterality: Bilateral;  EDD: 07/14/19 Tracey RNFA   DILATATION & CURETTAGE/HYSTEROSCOPY WITH MYOSURE N/A 05/03/2022   Procedure: DILATATION & CURETTAGE/HYSTEROSCOPY WITH MYOSURE/NOVASURE ABLATION;  Surgeon: Ivery Marking, MD;  Location: [facility];  Service: Gynecology;  Laterality: N/A;   WISDOM TOOTH EXTRACTION     Patient Active Problem List   Diagnosis Date Noted   Request for  sterilization 07/09/2019   Postpartum care following cesarean delivery 07/09/2019   SVD (spontaneous vaginal delivery) 08/12/2015   Postpartum care following vaginal delivery (3/18) 08/12/2015   Active labor at term 08/11/2015   Pregnancy 05/14/2015   Cervical incompetence @ 23 weeks 02/26/2013   Threatened preterm labor, antepartum 02/26/2013    PCP: Arva Lathe, MD   REFERRING PROVIDER: Arva Lathe, MD   REFERRING DIAG:  Diagnosis  M62.830 (ICD-10-CM) - Paraspinal muscle spasm    Rationale for Evaluation and Treatment: Rehabilitation  THERAPY DIAG:  Pain in thoracic spine  Shoulder weakness  Chronic right shoulder pain  ONSET DATE: >3 years   SUBJECTIVE:  SUBJECTIVE STATEMENT: Patient reports her back pain is still present and "angry", the rest of her body is feeling better. Is getting worse throughout the day.    PERTINENT HISTORY:  Recent Umbilical hernia repair surgery. Will be on precautions until at least 4/15. Pt states that 3 years ago, she reports lifting car seat out of car. Felt like she pulled a muscle and has been hurting off and on since injury. Reports that core strengthening will help pain, but if she doesn't run or perform exercises for a couple days the pain becomes significant at night. Pain is worse after work and after washing dishes at sink in flexed posture.  Pt is an avid runner. Next 1/2 marathon will be in September in [place]    PAIN:  Are you having pain? Yes: NPRS scale: 1/10  Pain location: under the R shoulder blade   Pain description: at worst. Pain is aching/throbbing  Aggravating factors: bending over for long period of time at work Relieving factors: core exercise.   PRECAUTIONS: Other: hernia surgery 8 days ago   RED  FLAGS: None   WEIGHT BEARING RESTRICTIONS:  no lifting >20lbs until 4/18   FALLS:  Has patient fallen in last 6 months? No  LIVING ENVIRONMENT: Lives with: lives with their family and lives with their spouse Lives in: House/apartment Stairs: Yes: Internal: 15 steps; on right going up and External: 1 steps; on right going up Has following equipment at home: None  OCCUPATION: Medical physists. In [employer]   PLOF: Independent  PATIENT GOALS: decrease back pain   NEXT MD VISIT: 4/15  OBJECTIVE:  Note: Objective measures were completed at Evaluation unless otherwise noted.  DIAGNOSTIC FINDINGS:  DG x ray:  IMPRESSION: Subtle levoscoliosis lumbar spine at the L2-L3 apex with minimal narrowing of the L5-S1 disc space.   PATIENT SURVEYS:  Modified Oswestry to be completed    COGNITION: Overall cognitive status: Within functional limits for tasks assessed     SENSATION: WFL  MUSCLE LENGTH:  POSTURE:  mild rounded shoulders.  PALPATION: Noted TP in the R side middle and lower traps as well as distal Lats and teres minor/major. Diffiultying palpating subscapularis due to lat tightness on the RLE  R scapula winging Asymetrical scapula movement with shoulder flexion and abduction   LUMBAR ROM:  Limited assessment due to recent umbilical hernia surgery  AROM eval  Flexion   Extension   Right lateral flexion   Left lateral flexion   Right rotation 25 deg   Left rotation 45 deg   (Blank rows = not tested)  LOWER EXTREMITY ROM:  WFL   Upper  EXTREMITY ROM:  Grossly WFL but noted mild asymmetry in R scapula .    Upper extremity  EXTREMITY MMT:    MMT Right eval Left eval  Shoulder extension *4 *4+  shoulder flexion *4+ *4+  shoulder abduction 4+ 4+  Shoulder adduction    Hip internal rotation 4+ 4+  Hip external rotation 4+ 4+  elbow flexion    Elbow extension    * Limited from pain abdomin  (Blank rows = not tested)  LUMBAR SPECIAL TESTS:   Recent Umbilical Hernia repair limits full assessment   GAIT: Distance walked: 60 Assistive device utilized: None Level of assistance: Complete Independence Comments: noted mild shoulder elevation bil decreased trunkal rotation    TREATMENT DATE: 09/25/2023 Manual:  STM to intercostal region of L ribs and lat x 20 minutes; implementation of effleurage and pettrisage for lat  musculature and trigger point ischemic holds to intercostal region Grade II thoracic mobilizations x multiple minutes   TherAct: Running analysis: -walk run intervals with patient dictating speed per her normal activity:  Analysis: L foot inversion causing internal rotation of hip and excessive rotation of trunk. L arm decreased swing and increased shoulder elevation.   Orange theraband lateral stepping 3 sets of 10x   Trigger Point Dry Needling  Subsequent Treatment: Instructions provided previously at initial dry needling treatment.   Patient Verbal Consent Given: Yes  Education Handout Provided: Previously Provided Muscles Treated: bilateral upper trap, L lumbar paraspinal   Electrical Stimulation Performed: No Treatment Response/Outcome: large twitch response, no adverse reaction     PATIENT EDUCATION:  Education details: POC. TDN.  Person educated: Patient Education method: Explanation Education comprehension: verbalized understanding  HOME EXERCISE PROGRAM: Access Code: [email or web address] URL: [email or web address] Date: 08/29/2023 Prepared by: Aurora Lees  Exercises - Supine Shoulder External Rotation with Resistance  - 1 x daily - 5 x weekly - 3 sets - 10 reps - 3 hold - Hooklying Shoulder Y  - 1 x daily - 5 x weekly - 3 sets - 10 reps - 2 hold - Supine Isometric Shoulder Extension with Towel  - 1 x daily - 5 x weekly - 3 sets - 10 reps - 3 hold  Trigger Point Dry Needling  What is Trigger Point Dry Needling (DN)? DN is a physical therapy technique used to treat muscle pain and  dysfunction. Specifically, DN helps deactivate muscle trigger points (muscle knots).  A thin filiform needle is used to penetrate the skin and stimulate the underlying trigger point. The goal is for a local twitch response (LTR) to occur and for the trigger point to relax. No medication of any kind is injected during the procedure.   What Does Trigger Point Dry Needling Feel Like?  The procedure feels different for each individual patient. Some patients report that they do not actually feel the needle enter the skin and overall the process is not painful. Very mild bleeding may occur. However, many patients feel a deep cramping in the muscle in which the needle was inserted. This is the local twitch response.   How Will I feel after the treatment? Soreness is normal, and the onset of soreness may not occur for a few hours. Typically this soreness does not last longer than two days.  Bruising is uncommon, however; ice can be used to decrease any possible bruising.  In rare cases feeling tired or nauseous after the treatment is normal. In addition, your symptoms may get worse before they get better, this period will typically not last longer than 24 hours.   What Can I do After My Treatment? Increase your hydration by drinking more water  for the next 24 hours.  You may place ice or heat on the areas treated that have become sore, however, do not use heat on inflamed or bruised areas. Heat often brings more relief post needling. You can continue your regular activities, but vigorous activity is not recommended initially after the treatment for 24 hours. DN is best combined with other physical therapy such as strengthening, stretching, and other therapies.   What are the complications? While your therapist has had extensive training in minimizing the risks of trigger point dry needling, it is important to understand the risks of any procedure.  Risks include bleeding, pain, fatigue, hematoma,  infection, vertigo, nausea or nerve involvement. Monitor for any changes to your skin or sensation.  Contact your therapist or MD with concerns.  A rare but serious complication is a pneumothorax over or near your middle and upper chest and back If you have dry needling in this area, monitor for the following symptoms: Shortness of breath on exertion and/or Difficulty taking a deep breath and/or Chest Pain and/or A dry cough If any of the above symptoms develop, please go to the nearest emergency room or call 911. Tell them you had dry needling over your thorax and report any symptoms you are having. Please follow-up with your treating therapist after you complete the medical evaluation.   ASSESSMENT:  CLINICAL IMPRESSION: Patient's jogging analysis performed with noticeable inversion of L foot causing internal rotation of hip and excessive rotation of trunk resulting in decreased R rotation and increased R upper trap elevation.  Patient educated on strengthening techniques for body and has extensive release of soft issue for pain reduction.  Pt will benefit from skilled PT to address Tspine and R shoulder pain to allow improved QoL and return to PLOF.    OBJECTIVE IMPAIRMENTS: decreased strength, hypomobility, impaired perceived functional ability, improper body mechanics, postural dysfunction, and pain.   ACTIVITY LIMITATIONS: carrying, lifting, squatting, bed mobility, reach over head, hygiene/grooming, and caring for others  PARTICIPATION LIMITATIONS: meal prep, cleaning, laundry, community activity, occupation, and yard work  PERSONAL FACTORS: Fitness and Time since onset of injury/illness/exacerbation are also affecting patient's functional outcome.   REHAB POTENTIAL: Excellent  CLINICAL DECISION MAKING: Stable/uncomplicated  EVALUATION COMPLEXITY: Low   GOALS: Goals reviewed with patient? Yes  SHORT TERM GOALS: Target date: 09/26/2023   Patient will be independent in home  exercise program to improve strength/mobility for better functional independence with ADLs. Baseline: initiated 4/4 Goal status: INITIAL   LONG TERM GOALS: Target date: 10/24/2023    Patient will increase mod ODI  score to equal to or greater than 5 points   to demonstrate statistically significant improvement in mobility and quality of life.  Baseline: to be completed Goal status: INITIAL  2.  Patient will tolerate sitting at work for >2 hours without pain and improved posture  Baseline: pain after 1 hour, with severe pain at 4 hours.  Goal status: INITIAL  3.  Patient will demonstrate ability to wash dishes without pain to improve function at home  Baseline: moderate pain while standing at sink Goal status: INITIAL  4.  Patient will return to running without pain to return to PLOF. Baseline: Pt unable to run since recent surgery   Goal status: INITIAL  5.  Patient will increase BUE strength to grossly 4+/5 to 5/5 to allow lifting up child at home without pain  Baseline: see UE MMT, with pain in abdomin  Goal status: INITIAL   PLAN:  PT FREQUENCY: 1-2x/week  PT DURATION: 8 weeks  PLANNED INTERVENTIONS: 97110-Therapeutic exercises, 97530- Therapeutic activity, 97112- Neuromuscular re-education, 97535- Self Care, [id number]- Manual therapy, [id number]- Aquatic Therapy, [id number]- Electrical stimulation (unattended), [id number]- Electrical stimulation (manual), [id number]- Vasopneumatic device, [id number]- Ultrasound, [id number]- Traction (mechanical), [id number]- Ionotophoresis 4mg /ml Dexamethasone , Patient/Family education, Balance training, Stair training, Taping, Dry Needling, Joint mobilization, Joint manipulation, Spinal manipulation, Spinal mobilization, Cryotherapy, and Moist heat.  PLAN FOR NEXT SESSION:   Manual for pain management in Tspine and R scapula TDN, Thoracic mobility  Running analysis    Shaunae Sieloff, PT 09/25/2023, 8:45 AM

## 2023-09-25 ENCOUNTER — Ambulatory Visit: Attending: Internal Medicine

## 2023-09-25 DIAGNOSIS — R29898 Other symptoms and signs involving the musculoskeletal system: Secondary | ICD-10-CM | POA: Insufficient documentation

## 2023-09-25 DIAGNOSIS — M546 Pain in thoracic spine: Secondary | ICD-10-CM | POA: Diagnosis present

## 2023-09-25 DIAGNOSIS — M25511 Pain in right shoulder: Secondary | ICD-10-CM | POA: Diagnosis present

## 2023-09-25 DIAGNOSIS — G8929 Other chronic pain: Secondary | ICD-10-CM | POA: Insufficient documentation

## 2023-09-25 NOTE — Therapy (Signed)
 OUTPATIENT PHYSICAL THERAPY THORACOLUMBAR TREATMENT   Patient Name: Kimberly Delgado MRN: [id number] DOB:26-Sep-1985, 38 y.o., female Today's Date: 09/29/2023  END OF SESSION:  PT End of Session - 09/29/23 0715     Visit Number 9    Number of Visits 16    Date for PT Re-Evaluation 10/24/23    PT Start Time 0715    PT Stop Time 0759    PT Time Calculation (min) 44 min    Activity Tolerance Patient tolerated treatment well    Behavior During Therapy [facility] for tasks assessed/performed                    Past Medical History:  Diagnosis Date   AMA (advanced maternal age) multigravida 35+    Anxiety    Depression    Exercise-induced asthma    per pt no inhaler   Hernia, umbilical    History of pyelonephritis    Incompetent cervix    12-23-2018  first trimester   PCOS (polycystic ovarian syndrome)    Seasonal allergies    Wears contact lenses    Past Surgical History:  Procedure Laterality Date   ABDOMINAL CERCLAGE N/A 12/25/2018   Procedure: TRANS CERCLAGE ABDOMINAL;  Surgeon: Yalcinkaya, Tamer, MD;  Location: [facility];  Service: Gynecology;  Laterality: N/A;   CERVICAL CERCLAGE N/A 02/17/2015   Procedure: CERCLAGE CERVICAL;  Surgeon: Ivery Marking, MD;  Location: [facility];  Service: Gynecology;  Laterality: N/A;  EDD: 08/16/15   CESAREAN SECTION WITH BILATERAL TUBAL LIGATION Bilateral 07/09/2019   Procedure: Primary CESAREAN SECTION WITH BILATERAL TUBAL LIGATION;  Surgeon: Ivery Marking, MD;  Location: [facility];  Service: Obstetrics;  Laterality: Bilateral;  EDD: 07/14/19 Tracey RNFA   DILATATION & CURETTAGE/HYSTEROSCOPY WITH MYOSURE N/A 05/03/2022   Procedure: DILATATION & CURETTAGE/HYSTEROSCOPY WITH MYOSURE/NOVASURE ABLATION;  Surgeon: Ivery Marking, MD;  Location: [facility];  Service: Gynecology;  Laterality: N/A;   WISDOM TOOTH EXTRACTION     Patient Active Problem List   Diagnosis Date Noted   Request for  sterilization 07/09/2019   Postpartum care following cesarean delivery 07/09/2019   SVD (spontaneous vaginal delivery) 08/12/2015   Postpartum care following vaginal delivery (3/18) 08/12/2015   Active labor at term 08/11/2015   Pregnancy 05/14/2015   Cervical incompetence @ 23 weeks 02/26/2013   Threatened preterm labor, antepartum 02/26/2013    PCP: Arva Lathe, MD   REFERRING PROVIDER: Arva Lathe, MD   REFERRING DIAG:  Diagnosis  M62.830 (ICD-10-CM) - Paraspinal muscle spasm    Rationale for Evaluation and Treatment: Rehabilitation  THERAPY DIAG:  Pain in thoracic spine  Shoulder weakness  Chronic right shoulder pain  ONSET DATE: >3 years   SUBJECTIVE:  SUBJECTIVE STATEMENT: Patient went for a long run, had L hip and R shoulder pain.    PERTINENT HISTORY:  Recent Umbilical hernia repair surgery. Will be on precautions until at least 4/15. Pt states that 3 years ago, she reports lifting car seat out of car. Felt like she pulled a muscle and has been hurting off and on since injury. Reports that core strengthening will help pain, but if she doesn't run or perform exercises for a couple days the pain becomes significant at night. Pain is worse after work and after washing dishes at sink in flexed posture.  Pt is an avid runner. Next 1/2 marathon will be in September in [place]    PAIN:  Are you having pain? Yes: NPRS scale: 1/10  Pain location: under the R shoulder blade   Pain description: at worst. Pain is aching/throbbing  Aggravating factors: bending over for long period of time at work Relieving factors: core exercise.   PRECAUTIONS: Other: hernia surgery 8 days ago   RED FLAGS: None   WEIGHT BEARING RESTRICTIONS:  no lifting >20lbs until 4/18    FALLS:  Has patient fallen in last 6 months? No  LIVING ENVIRONMENT: Lives with: lives with their family and lives with their spouse Lives in: House/apartment Stairs: Yes: Internal: 15 steps; on right going up and External: 1 steps; on right going up Has following equipment at home: None  OCCUPATION: Medical physists. In [employer]   PLOF: Independent  PATIENT GOALS: decrease back pain   NEXT MD VISIT: 4/15  OBJECTIVE:  Note: Objective measures were completed at Evaluation unless otherwise noted.  DIAGNOSTIC FINDINGS:  DG x ray:  IMPRESSION: Subtle levoscoliosis lumbar spine at the L2-L3 apex with minimal narrowing of the L5-S1 disc space.   PATIENT SURVEYS:  Modified Oswestry to be completed    COGNITION: Overall cognitive status: Within functional limits for tasks assessed     SENSATION: WFL  MUSCLE LENGTH:  POSTURE:  mild rounded shoulders.  PALPATION: Noted TP in the R side middle and lower traps as well as distal Lats and teres minor/major. Diffiultying palpating subscapularis due to lat tightness on the RLE  R scapula winging Asymetrical scapula movement with shoulder flexion and abduction   LUMBAR ROM:  Limited assessment due to recent umbilical hernia surgery  AROM eval  Flexion   Extension   Right lateral flexion   Left lateral flexion   Right rotation 25 deg   Left rotation 45 deg   (Blank rows = not tested)  LOWER EXTREMITY ROM:  WFL   Upper  EXTREMITY ROM:  Grossly WFL but noted mild asymmetry in R scapula .    Upper extremity  EXTREMITY MMT:    MMT Right eval Left eval  Shoulder extension *4 *4+  shoulder flexion *4+ *4+  shoulder abduction 4+ 4+  Shoulder adduction    Hip internal rotation 4+ 4+  Hip external rotation 4+ 4+  elbow flexion    Elbow extension    * Limited from pain abdomin  (Blank rows = not tested)  LUMBAR SPECIAL TESTS:  Recent Umbilical Hernia repair limits full assessment   GAIT: Distance  walked: 60 Assistive device utilized: None Level of assistance: Complete Independence Comments: noted mild shoulder elevation bil decreased trunkal rotation    TREATMENT DATE: 09/29/2023 Manual:  STM to intercostal region of L ribs and lat x  ; implementation of effleurage and pettrisage for lat musculature and trigger point ischemic holds to intercostal region  TherAct: Half kneeling rotation to bring elbow to mat 10x each side (discomfort in L hip) Sidelying : rotation 10x; on last hold 30 seconds each side  Piriformis assessment: decreased length L noted  2.5 matrix row in half kneeling   Trigger Point Dry Needling  Subsequent Treatment: Instructions provided previously at initial dry needling treatment.   Patient Verbal Consent Given: Yes  Education Handout Provided: Previously Provided Muscles Treated: bilateral upper trap, L piriformis  Electrical Stimulation Performed: No Treatment Response/Outcome: large twitch response, no adverse reaction     PATIENT EDUCATION:  Education details: POC. TDN.  Person educated: Patient Education method: Explanation Education comprehension: verbalized understanding  HOME EXERCISE PROGRAM: Access Code: [email or web address] URL: [email or web address] Date: 08/29/2023 Prepared by: Aurora Lees  Exercises - Supine Shoulder External Rotation with Resistance  - 1 x daily - 5 x weekly - 3 sets - 10 reps - 3 hold - Hooklying Shoulder Y  - 1 x daily - 5 x weekly - 3 sets - 10 reps - 2 hold - Supine Isometric Shoulder Extension with Towel  - 1 x daily - 5 x weekly - 3 sets - 10 reps - 3 hold  Trigger Point Dry Needling  What is Trigger Point Dry Needling (DN)? DN is a physical therapy technique used to treat muscle pain and dysfunction. Specifically, DN helps deactivate muscle trigger points (muscle knots).  A thin filiform needle is used to penetrate the skin and stimulate the underlying trigger point. The goal is for a local  twitch response (LTR) to occur and for the trigger point to relax. No medication of any kind is injected during the procedure.   What Does Trigger Point Dry Needling Feel Like?  The procedure feels different for each individual patient. Some patients report that they do not actually feel the needle enter the skin and overall the process is not painful. Very mild bleeding may occur. However, many patients feel a deep cramping in the muscle in which the needle was inserted. This is the local twitch response.   How Will I feel after the treatment? Soreness is normal, and the onset of soreness may not occur for a few hours. Typically this soreness does not last longer than two days.  Bruising is uncommon, however; ice can be used to decrease any possible bruising.  In rare cases feeling tired or nauseous after the treatment is normal. In addition, your symptoms may get worse before they get better, this period will typically not last longer than 24 hours.   What Can I do After My Treatment? Increase your hydration by drinking more water  for the next 24 hours.  You may place ice or heat on the areas treated that have become sore, however, do not use heat on inflamed or bruised areas. Heat often brings more relief post needling. You can continue your regular activities, but vigorous activity is not recommended initially after the treatment for 24 hours. DN is best combined with other physical therapy such as strengthening, stretching, and other therapies.   What are the complications? While your therapist has had extensive training in minimizing the risks of trigger point dry needling, it is important to understand the risks of any procedure.  Risks include bleeding, pain, fatigue, hematoma, infection, vertigo, nausea or nerve involvement. Monitor for any changes to your skin or sensation. Contact your therapist or MD with concerns.  A rare but serious complication is a pneumothorax over or near your  middle and upper chest and back  If you have dry needling in this area, monitor for the following symptoms: Shortness of breath on exertion and/or Difficulty taking a deep breath and/or Chest Pain and/or A dry cough If any of the above symptoms develop, please go to the nearest emergency room or call 911. Tell them you had dry needling over your thorax and report any symptoms you are having. Please follow-up with your treating therapist after you complete the medical evaluation.   ASSESSMENT:  CLINICAL IMPRESSION: Patient has decreased piriformis length on LLE resulting in impingement and rotation. Patient has increased rotation by end of session.  Education on strengthening and positioning for optimal carryover to running performed without pain.  Pt will benefit from skilled PT to address Tspine and R shoulder pain to allow improved QoL and return to PLOF.    OBJECTIVE IMPAIRMENTS: decreased strength, hypomobility, impaired perceived functional ability, improper body mechanics, postural dysfunction, and pain.   ACTIVITY LIMITATIONS: carrying, lifting, squatting, bed mobility, reach over head, hygiene/grooming, and caring for others  PARTICIPATION LIMITATIONS: meal prep, cleaning, laundry, community activity, occupation, and yard work  PERSONAL FACTORS: Fitness and Time since onset of injury/illness/exacerbation are also affecting patient's functional outcome.   REHAB POTENTIAL: Excellent  CLINICAL DECISION MAKING: Stable/uncomplicated  EVALUATION COMPLEXITY: Low   GOALS: Goals reviewed with patient? Yes  SHORT TERM GOALS: Target date: 09/26/2023   Patient will be independent in home exercise program to improve strength/mobility for better functional independence with ADLs. Baseline: initiated 4/4 Goal status: INITIAL   LONG TERM GOALS: Target date: 10/24/2023    Patient will increase mod ODI  score to equal to or greater than 5 points   to demonstrate statistically  significant improvement in mobility and quality of life.  Baseline: to be completed Goal status: INITIAL  2.  Patient will tolerate sitting at work for >2 hours without pain and improved posture  Baseline: pain after 1 hour, with severe pain at 4 hours.  Goal status: INITIAL  3.  Patient will demonstrate ability to wash dishes without pain to improve function at home  Baseline: moderate pain while standing at sink Goal status: INITIAL  4.  Patient will return to running without pain to return to PLOF. Baseline: Pt unable to run since recent surgery   Goal status: INITIAL  5.  Patient will increase BUE strength to grossly 4+/5 to 5/5 to allow lifting up child at home without pain  Baseline: see UE MMT, with pain in abdomin  Goal status: INITIAL   PLAN:  PT FREQUENCY: 1-2x/week  PT DURATION: 8 weeks  PLANNED INTERVENTIONS: 97110-Therapeutic exercises, 97530- Therapeutic activity, 97112- Neuromuscular re-education, 97535- Self Care, 54098- Manual therapy, [id number]- Aquatic Therapy, J1914- Electrical stimulation (unattended), [id number]- Electrical stimulation (manual), [id number]- Vasopneumatic device, [id number]- Ultrasound, [id number]- Traction (mechanical), [id number]- Ionotophoresis 4mg /ml Dexamethasone , Patient/Family education, Balance training, Stair training, Taping, Dry Needling, Joint mobilization, Joint manipulation, Spinal manipulation, Spinal mobilization, Cryotherapy, and Moist heat.  PLAN FOR NEXT SESSION:   Manual for pain management in Tspine and R scapula TDN, Thoracic mobility  Running analysis    Labresha Mellor, PT 09/29/2023, 8:00 AM

## 2023-09-29 ENCOUNTER — Ambulatory Visit

## 2023-09-29 DIAGNOSIS — R29898 Other symptoms and signs involving the musculoskeletal system: Secondary | ICD-10-CM

## 2023-09-29 DIAGNOSIS — M546 Pain in thoracic spine: Secondary | ICD-10-CM | POA: Diagnosis not present

## 2023-09-29 DIAGNOSIS — G8929 Other chronic pain: Secondary | ICD-10-CM

## 2023-09-30 NOTE — Therapy (Signed)
 OUTPATIENT PHYSICAL THERAPY THORACOLUMBAR TREATMENT/ Physical Therapy Progress Note   Dates of reporting period  08/29/23   to   10/01/23    Patient Name: Kimberly Delgado MRN: [id number] DOB:1985-12-11, 38 y.o., female Today's Date: 10/01/2023  END OF SESSION:  PT End of Session - 10/01/23 0712     Visit Number 10    Number of Visits 16    Date for PT Re-Evaluation 10/24/23    PT Start Time 0712    PT Stop Time 0757    PT Time Calculation (min) 45 min    Activity Tolerance Patient tolerated treatment well    Behavior During Therapy [facility] for tasks assessed/performed                     Past Medical History:  Diagnosis Date   AMA (advanced maternal age) multigravida 35+    Anxiety    Depression    Exercise-induced asthma    per pt no inhaler   Hernia, umbilical    History of pyelonephritis    Incompetent cervix    12-23-2018  first trimester   PCOS (polycystic ovarian syndrome)    Seasonal allergies    Wears contact lenses    Past Surgical History:  Procedure Laterality Date   ABDOMINAL CERCLAGE N/A 12/25/2018   Procedure: TRANS CERCLAGE ABDOMINAL;  Surgeon: Yalcinkaya, Tamer, MD;  Location: [facility];  Service: Gynecology;  Laterality: N/A;   CERVICAL CERCLAGE N/A 02/17/2015   Procedure: CERCLAGE CERVICAL;  Surgeon: Ivery Marking, MD;  Location: [facility];  Service: Gynecology;  Laterality: N/A;  EDD: 08/16/15   CESAREAN SECTION WITH BILATERAL TUBAL LIGATION Bilateral 07/09/2019   Procedure: Primary CESAREAN SECTION WITH BILATERAL TUBAL LIGATION;  Surgeon: Ivery Marking, MD;  Location: [facility];  Service: Obstetrics;  Laterality: Bilateral;  EDD: 07/14/19 Tracey RNFA   DILATATION & CURETTAGE/HYSTEROSCOPY WITH MYOSURE N/A 05/03/2022   Procedure: DILATATION & CURETTAGE/HYSTEROSCOPY WITH MYOSURE/NOVASURE ABLATION;  Surgeon: Ivery Marking, MD;  Location: [facility];  Service: Gynecology;  Laterality: N/A;    WISDOM TOOTH EXTRACTION     Patient Active Problem List   Diagnosis Date Noted   Request for sterilization 07/09/2019   Postpartum care following cesarean delivery 07/09/2019   SVD (spontaneous vaginal delivery) 08/12/2015   Postpartum care following vaginal delivery (3/18) 08/12/2015   Active labor at term 08/11/2015   Pregnancy 05/14/2015   Cervical incompetence @ 23 weeks 02/26/2013   Threatened preterm labor, antepartum 02/26/2013    PCP: Arva Lathe, MD   REFERRING PROVIDER: Arva Lathe, MD   REFERRING DIAG:  Diagnosis  M62.830 (ICD-10-CM) - Paraspinal muscle spasm    Rationale for Evaluation and Treatment: Rehabilitation  THERAPY DIAG:  Pain in thoracic spine  Shoulder weakness  Chronic right shoulder pain  ONSET DATE: >3 years   SUBJECTIVE:  SUBJECTIVE STATEMENT: Generally going in the right direction, can get to spot easier but does have occasional "angry" moments.   PERTINENT HISTORY:  Recent Umbilical hernia repair surgery. Will be on precautions until at least 4/15. Pt states that 3 years ago, she reports lifting car seat out of car. Felt like she pulled a muscle and has been hurting off and on since injury. Reports that core strengthening will help pain, but if she doesn't run or perform exercises for a couple days the pain becomes significant at night. Pain is worse after work and after washing dishes at sink in flexed posture.  Pt is an avid runner. Next 1/2 marathon will be in September in [place]    PAIN:  Are you having pain? Yes: NPRS scale: 1/10  Pain location: under the R shoulder blade   Pain description: at worst. Pain is aching/throbbing  Aggravating factors: bending over for long period of time at work Relieving factors: core exercise.    PRECAUTIONS: Other: hernia surgery 8 days ago   RED FLAGS: None   WEIGHT BEARING RESTRICTIONS:  no lifting >20lbs until 4/18   FALLS:  Has patient fallen in last 6 months? No  LIVING ENVIRONMENT: Lives with: lives with their family and lives with their spouse Lives in: House/apartment Stairs: Yes: Internal: 15 steps; on right going up and External: 1 steps; on right going up Has following equipment at home: None  OCCUPATION: Medical physists. In [employer]   PLOF: Independent  PATIENT GOALS: decrease back pain   NEXT MD VISIT: 4/15  OBJECTIVE:  Note: Objective measures were completed at Evaluation unless otherwise noted.  DIAGNOSTIC FINDINGS:  DG x ray:  IMPRESSION: Subtle levoscoliosis lumbar spine at the L2-L3 apex with minimal narrowing of the L5-S1 disc space.   PATIENT SURVEYS:  Modified Oswestry to be completed    COGNITION: Overall cognitive status: Within functional limits for tasks assessed     SENSATION: WFL  MUSCLE LENGTH:  POSTURE:  mild rounded shoulders.  PALPATION: Noted TP in the R side middle and lower traps as well as distal Lats and teres minor/major. Diffiultying palpating subscapularis due to lat tightness on the RLE  R scapula winging Asymetrical scapula movement with shoulder flexion and abduction   LUMBAR ROM:  Limited assessment due to recent umbilical hernia surgery  AROM eval  Flexion   Extension   Right lateral flexion   Left lateral flexion   Right rotation 25 deg   Left rotation 45 deg   (Blank rows = not tested)  LOWER EXTREMITY ROM:  WFL   Upper  EXTREMITY ROM:  Grossly WFL but noted mild asymmetry in R scapula .    Upper extremity  EXTREMITY MMT:    MMT Right eval Left eval  Shoulder extension *4 *4+  shoulder flexion *4+ *4+  shoulder abduction 4+ 4+  Shoulder adduction    Hip internal rotation 4+ 4+  Hip external rotation 4+ 4+  elbow flexion    Elbow extension    * Limited from pain  abdomin  (Blank rows = not tested)  LUMBAR SPECIAL TESTS:  Recent Umbilical Hernia repair limits full assessment   GAIT: Distance walked: 60 Assistive device utilized: None Level of assistance: Complete Independence Comments: noted mild shoulder elevation bil decreased trunkal rotation    TREATMENT DATE: 10/01/2023 Physical therapy treatment session today consisted of completing assessment of goals and administration of testing as demonstrated and documented in flow sheet, treatment, and goals section of this note.  Addition treatments may be found below.    TherAct: Bridge with hamstring slides 10x each LE Incline quadruped position 15lb KB row single arm 10x each UE KB hold with march 10x each LE Wall extensions 10x   Trigger Point Dry Needling  Subsequent Treatment: Instructions provided previously at initial dry needling treatment.   Patient Verbal Consent Given: Yes  Education Handout Provided: Previously Provided Muscles Treated: bilateral upper trap, L piriformis  Electrical Stimulation Performed: No Treatment Response/Outcome: large twitch response, no adverse reaction     PATIENT EDUCATION:  Education details: POC. TDN.  Person educated: Patient Education method: Explanation Education comprehension: verbalized understanding  HOME EXERCISE PROGRAM: Access Code: [email or web address] URL: [email or web address] Date: 08/29/2023 Prepared by: Aurora Lees  Exercises - Supine Shoulder External Rotation with Resistance  - 1 x daily - 5 x weekly - 3 sets - 10 reps - 3 hold - Hooklying Shoulder Y  - 1 x daily - 5 x weekly - 3 sets - 10 reps - 2 hold - Supine Isometric Shoulder Extension with Towel  - 1 x daily - 5 x weekly - 3 sets - 10 reps - 3 hold  Trigger Point Dry Needling  What is Trigger Point Dry Needling (DN)? DN is a physical therapy technique used to treat muscle pain and dysfunction. Specifically, DN helps deactivate muscle trigger points (muscle  knots).  A thin filiform needle is used to penetrate the skin and stimulate the underlying trigger point. The goal is for a local twitch response (LTR) to occur and for the trigger point to relax. No medication of any kind is injected during the procedure.   What Does Trigger Point Dry Needling Feel Like?  The procedure feels different for each individual patient. Some patients report that they do not actually feel the needle enter the skin and overall the process is not painful. Very mild bleeding may occur. However, many patients feel a deep cramping in the muscle in which the needle was inserted. This is the local twitch response.   How Will I feel after the treatment? Soreness is normal, and the onset of soreness may not occur for a few hours. Typically this soreness does not last longer than two days.  Bruising is uncommon, however; ice can be used to decrease any possible bruising.  In rare cases feeling tired or nauseous after the treatment is normal. In addition, your symptoms may get worse before they get better, this period will typically not last longer than 24 hours.   What Can I do After My Treatment? Increase your hydration by drinking more water  for the next 24 hours.  You may place ice or heat on the areas treated that have become sore, however, do not use heat on inflamed or bruised areas. Heat often brings more relief post needling. You can continue your regular activities, but vigorous activity is not recommended initially after the treatment for 24 hours. DN is best combined with other physical therapy such as strengthening, stretching, and other therapies.   What are the complications? While your therapist has had extensive training in minimizing the risks of trigger point dry needling, it is important to understand the risks of any procedure.  Risks include bleeding, pain, fatigue, hematoma, infection, vertigo, nausea or nerve involvement. Monitor for any changes to your skin  or sensation. Contact your therapist or MD with concerns.  A rare but serious complication is a pneumothorax over or near your middle and upper chest and back If you have  dry needling in this area, monitor for the following symptoms: Shortness of breath on exertion and/or Difficulty taking a deep breath and/or Chest Pain and/or A dry cough If any of the above symptoms develop, please go to the nearest emergency room or call 911. Tell them you had dry needling over your thorax and report any symptoms you are having. Please follow-up with your treating therapist after you complete the medical evaluation.   ASSESSMENT:  CLINICAL IMPRESSION: Patient's condition has the potential to improve in response to therapy. Maximum improvement is yet to be obtained. The anticipated improvement is attainable and reasonable in a generally predictable time.  Patient reports improved ability to run but continues to be challenged with activities that require trunk flexion. She is eager to progress her pain free mobility. Pt will benefit from skilled PT to address Tspine and R shoulder pain to allow improved QoL and return to PLOF.    OBJECTIVE IMPAIRMENTS: decreased strength, hypomobility, impaired perceived functional ability, improper body mechanics, postural dysfunction, and pain.   ACTIVITY LIMITATIONS: carrying, lifting, squatting, bed mobility, reach over head, hygiene/grooming, and caring for others  PARTICIPATION LIMITATIONS: meal prep, cleaning, laundry, community activity, occupation, and yard work  PERSONAL FACTORS: Fitness and Time since onset of injury/illness/exacerbation are also affecting patient's functional outcome.   REHAB POTENTIAL: Excellent  CLINICAL DECISION MAKING: Stable/uncomplicated  EVALUATION COMPLEXITY: Low   GOALS: Goals reviewed with patient? Yes  SHORT TERM GOALS: Target date: 09/26/2023   Patient will be independent in home exercise program to improve strength/mobility  for better functional independence with ADLs. Baseline: initiated 4/4 5/7: compliant Goal status: MET   LONG TERM GOALS: Target date: 10/24/2023    Patient will increase mod ODI  score to equal to or greater than 5 points   to demonstrate statistically significant improvement in mobility and quality of life.  Baseline: to be completed 5/7: 18% Goal status: Partially Met  2.  Patient will tolerate sitting at work for >2 hours without pain and improved posture  Baseline: pain after 1 hour, with severe pain at 4 hours. 5/7: tries to not sit for as long anymore, can sit for longer without pain increase but 2 hours is max  Goal status: Partially Met  3.  Patient will demonstrate ability to wash dishes without pain to improve function at home  Baseline: moderate pain while standing at sink 5/7: moderate pain Goal status: ongoing  4.  Patient will return to running without pain to return to PLOF. Baseline: Pt unable to run since recent surgery  5/7: progressing, has less pain with increased running duration.  Goal status: INITIAL  5.  Patient will increase BUE strength to grossly 4+/5 to 5/5 to allow lifting up child at home without pain  Baseline: see UE MMT, with pain in abdomin 5/7: grossly 4+/5 RUE; LUE 5/5 with extension 4+/5  Goal status: partially met   PLAN:  PT FREQUENCY: 1-2x/week  PT DURATION: 8 weeks  PLANNED INTERVENTIONS: 97110-Therapeutic exercises, 97530- Therapeutic activity, 97112- Neuromuscular re-education, 97535- Self Care, 40981- Manual therapy, [id number]- Aquatic Therapy, [id number]- Electrical stimulation (unattended), [id number]- Electrical stimulation (manual), 97016- Vasopneumatic device, [id number]- Ultrasound, [id number]- Traction (mechanical), [id number]- Ionotophoresis 4mg /ml Dexamethasone , Patient/Family education, Balance training, Stair training, Taping, Dry Needling, Joint mobilization, Joint manipulation, Spinal manipulation, Spinal mobilization, Cryotherapy, and Moist  heat.  PLAN FOR NEXT SESSION:   Manual for pain management in Tspine and R scapula TDN, Thoracic mobility  Running analysis    Abdoulie Tierce, PT 10/01/2023, 7:59  AM

## 2023-10-01 ENCOUNTER — Ambulatory Visit

## 2023-10-01 DIAGNOSIS — M546 Pain in thoracic spine: Secondary | ICD-10-CM

## 2023-10-01 DIAGNOSIS — R29898 Other symptoms and signs involving the musculoskeletal system: Secondary | ICD-10-CM

## 2023-10-01 DIAGNOSIS — G8929 Other chronic pain: Secondary | ICD-10-CM

## 2023-10-06 ENCOUNTER — Ambulatory Visit

## 2023-10-06 DIAGNOSIS — R29898 Other symptoms and signs involving the musculoskeletal system: Secondary | ICD-10-CM

## 2023-10-06 DIAGNOSIS — G8929 Other chronic pain: Secondary | ICD-10-CM

## 2023-10-06 DIAGNOSIS — M546 Pain in thoracic spine: Secondary | ICD-10-CM | POA: Diagnosis not present

## 2023-10-06 NOTE — Therapy (Signed)
 OUTPATIENT PHYSICAL THERAPY THORACOLUMBAR TREATMENT/     Patient Name: Kimberly Delgado MRN: [id number] DOB:1986-05-01, 38 y.o., female Today's Date: 10/06/2023  END OF SESSION:  PT End of Session - 10/06/23 1533     Visit Number 11    Number of Visits 16    Date for PT Re-Evaluation 10/24/23    PT Start Time 1533    PT Stop Time 1614    PT Time Calculation (min) 41 min    Activity Tolerance Patient tolerated treatment well    Behavior During Therapy WFL for tasks assessed/performed                      Past Medical History:  Diagnosis Date   AMA (advanced maternal age) multigravida 35+    Anxiety    Depression    Exercise-induced asthma    per pt no inhaler   Hernia, umbilical    History of pyelonephritis    Incompetent cervix    12-23-2018  first trimester   PCOS (polycystic ovarian syndrome)    Seasonal allergies    Wears contact lenses    Past Surgical History:  Procedure Laterality Date   ABDOMINAL CERCLAGE N/A 12/25/2018   Procedure: TRANS CERCLAGE ABDOMINAL;  Surgeon: Yalcinkaya, Tamer, MD;  Location: [facility];  Service: Gynecology;  Laterality: N/A;   CERVICAL CERCLAGE N/A 02/17/2015   Procedure: CERCLAGE CERVICAL;  Surgeon: Ivery Marking, MD;  Location: [facility];  Service: Gynecology;  Laterality: N/A;  EDD: 08/16/15   CESAREAN SECTION WITH BILATERAL TUBAL LIGATION Bilateral 07/09/2019   Procedure: Primary CESAREAN SECTION WITH BILATERAL TUBAL LIGATION;  Surgeon: Ivery Marking, MD;  Location: [facility];  Service: Obstetrics;  Laterality: Bilateral;  EDD: 07/14/19 Tracey RNFA   DILATATION & CURETTAGE/HYSTEROSCOPY WITH MYOSURE N/A 05/03/2022   Procedure: DILATATION & CURETTAGE/HYSTEROSCOPY WITH MYOSURE/NOVASURE ABLATION;  Surgeon: Ivery Marking, MD;  [facility];  Service: Gynecology;  Laterality: N/A;   WISDOM TOOTH EXTRACTION     Patient Active Problem List   Diagnosis Date Noted    Request for sterilization 07/09/2019   Postpartum care following cesarean delivery 07/09/2019   SVD (spontaneous vaginal delivery) 08/12/2015   Postpartum care following vaginal delivery (3/18) 08/12/2015   Active labor at term 08/11/2015   Pregnancy 05/14/2015   Cervical incompetence @ 23 weeks 02/26/2013   Threatened preterm labor, antepartum 02/26/2013    PCP: Arva Lathe, MD   REFERRING PROVIDER: Arva Lathe, MD   REFERRING DIAG:  Diagnosis  M62.830 (ICD-10-CM) - Paraspinal muscle spasm    Rationale for Evaluation and Treatment: Rehabilitation  THERAPY DIAG:  Pain in thoracic spine  Shoulder weakness  Chronic right shoulder pain  ONSET DATE: >3 years   SUBJECTIVE:  SUBJECTIVE STATEMENT: Patient reports her L glute/back pain continues to flare up, the upper back region is improving between each session.   PERTINENT HISTORY:  Recent Umbilical hernia repair surgery. Will be on precautions until at least 4/15. Pt states that 3 years ago, she reports lifting car seat out of car. Felt like she pulled a muscle and has been hurting off and on since injury. Reports that core strengthening will help pain, but if she doesn't run or perform exercises for a couple days the pain becomes significant at night. Pain is worse after work and after washing dishes at sink in flexed posture.  Pt is an avid runner. Next 1/2 marathon will be in September in [place]    PAIN:  Are you having pain? Yes: NPRS scale: 1/10  Pain location: under the R shoulder blade   Pain description: at worst. Pain is aching/throbbing  Aggravating factors: bending over for long period of time at work Relieving factors: core exercise.   PRECAUTIONS: Other: hernia surgery 8 days ago   RED  FLAGS: None   WEIGHT BEARING RESTRICTIONS: no lifting >20lbs until 4/18   FALLS:  Has patient fallen in last 6 months? No  LIVING ENVIRONMENT: Lives with: lives with their family and lives with their spouse Lives in: House/apartment Stairs: Yes: Internal: 15 steps; on right going up and External: 1 steps; on right going up Has following equipment at home: None  OCCUPATION: Medical physists. In [employer] [facility]   PLOF: Independent  PATIENT GOALS: decrease back pain   NEXT MD VISIT: 4/15  OBJECTIVE:  Note: Objective measures were completed at Evaluation unless otherwise noted.  DIAGNOSTIC FINDINGS:  DG x ray:  IMPRESSION: Subtle levoscoliosis lumbar spine at the L2-L3 apex with minimal narrowing of the L5-S1 disc space.   PATIENT SURVEYS:  Modified Oswestry to be completed    COGNITION: Overall cognitive status: Within functional limits for tasks assessed     SENSATION: WFL  MUSCLE LENGTH:  POSTURE: mild rounded shoulders.  PALPATION: Noted TP in the R side middle and lower traps as well as distal Lats and teres minor/major. Diffiultying palpating subscapularis due to lat tightness on the RLE  R scapula winging Asymetrical scapula movement with shoulder flexion and abduction   LUMBAR ROM:  Limited assessment due to recent umbilical hernia surgery  AROM eval  Flexion   Extension   Right lateral flexion   Left lateral flexion   Right rotation 25 deg   Left rotation 45 deg   (Blank rows = not tested)  LOWER EXTREMITY ROM:  WFL   Upper  EXTREMITY ROM:  Grossly WFL but noted mild asymmetry in R scapula .    Upper extremity  EXTREMITY MMT:    MMT Right eval Left eval  Shoulder extension *4 *4+  shoulder flexion *4+ *4+  shoulder abduction 4+ 4+  Shoulder adduction    Hip internal rotation 4+ 4+  Hip external rotation 4+ 4+  elbow flexion    Elbow extension    * Limited from pain abdomin  (Blank rows = not tested)  LUMBAR SPECIAL TESTS:   Recent Umbilical Hernia repair limits full assessment   GAIT: Distance walked: 60 Assistive device utilized: None Level of assistance: Complete Independence Comments: noted mild shoulder elevation bil decreased trunkal rotation    TREATMENT DATE: 10/06/2023 L UPA to L3 increased pain, R UPA decreased pain;  Noticeable prior tear/strain of L glute  TherAct: Posterior pelvic 10x; 10 second holds  TrA activation; legs  at 45 degrees return to neutral  Posterior pelvic tilt standing against wall: TrA hug coordination 10x Single leg paloff press 10x each LE #2.5 weight on cable  Manual: Extensive soft tissue release of R periscapular region, R medial border of scapula release x 13 minutes Grade II mobilizations thoracic spine and lumbar spine    PATIENT EDUCATION:  Education details: POC. TDN.  Person educated: Patient Education method: Explanation Education comprehension: verbalized understanding  HOME EXERCISE PROGRAM: Access Code: [email or web address] URL: [email or web address] Date: 08/29/2023 Prepared by: Aurora Lees  Exercises - Supine Shoulder External Rotation with Resistance  - 1 x daily - 5 x weekly - 3 sets - 10 reps - 3 hold - Hooklying Shoulder Y  - 1 x daily - 5 x weekly - 3 sets - 10 reps - 2 hold - Supine Isometric Shoulder Extension with Towel  - 1 x daily - 5 x weekly - 3 sets - 10 reps - 3 hold  Trigger Point Dry Needling  What is Trigger Point Dry Needling (DN)? DN is a physical therapy technique used to treat muscle pain and dysfunction. Specifically, DN helps deactivate muscle trigger points (muscle knots).  A thin filiform needle is used to penetrate the skin and stimulate the underlying trigger point. The goal is for a local twitch response (LTR) to occur and for the trigger point to relax. No medication of any kind is injected during the procedure.   What Does Trigger Point Dry Needling Feel Like?  The procedure feels different for each  individual patient. Some patients report that they do not actually feel the needle enter the skin and overall the process is not painful. Very mild bleeding may occur. However, many patients feel a deep cramping in the muscle in which the needle was inserted. This is the local twitch response.   How Will I feel after the treatment? Soreness is normal, and the onset of soreness may not occur for a few hours. Typically this soreness does not last longer than two days.  Bruising is uncommon, however; ice can be used to decrease any possible bruising.  In rare cases feeling tired or nauseous after the treatment is normal. In addition, your symptoms may get worse before they get better, this period will typically not last longer than 24 hours.   What Can I do After My Treatment? Increase your hydration by drinking more water  for the next 24 hours.  You may place ice or heat on the areas treated that have become sore, however, do not use heat on inflamed or bruised areas. Heat often brings more relief post needling. You can continue your regular activities, but vigorous activity is not recommended initially after the treatment for 24 hours. DN is best combined with other physical therapy such as strengthening, stretching, and other therapies.   What are the complications? While your therapist has had extensive training in minimizing the risks of trigger point dry needling, it is important to understand the risks of any procedure.  Risks include bleeding, pain, fatigue, hematoma, infection, vertigo, nausea or nerve involvement. Monitor for any changes to your skin or sensation. Contact your therapist or MD with concerns.  A rare but serious complication is a pneumothorax over or near your middle and upper chest and back If you have dry needling in this area, monitor for the following symptoms: Shortness of breath on exertion and/or Difficulty taking a deep breath and/or Chest Pain and/or A dry  cough If any of the above symptoms  develop, please go to the nearest emergency room or call 911. Tell them you had dry needling over your thorax and report any symptoms you are having. Please follow-up with your treating therapist after you complete the medical evaluation.   ASSESSMENT:  CLINICAL IMPRESSION: Patient shows signs and symptoms of L L3 rotation and closing patterning that reduced with gentle grade II opening mobilizations and core stabilization techniques. Patient tolerates progressive deep core stabilization without pain.  Pt will benefit from skilled PT to address Tspine and R shoulder pain to allow improved QoL and return to PLOF.    OBJECTIVE IMPAIRMENTS: decreased strength, hypomobility, impaired perceived functional ability, improper body mechanics, postural dysfunction, and pain.   ACTIVITY LIMITATIONS: carrying, lifting, squatting, bed mobility, reach over head, hygiene/grooming, and caring for others  PARTICIPATION LIMITATIONS: meal prep, cleaning, laundry, community activity, occupation, and yard work  PERSONAL FACTORS: Fitness and Time since onset of injury/illness/exacerbation are also affecting patient's functional outcome.   REHAB POTENTIAL: Excellent  CLINICAL DECISION MAKING: Stable/uncomplicated  EVALUATION COMPLEXITY: Low   GOALS: Goals reviewed with patient? Yes  SHORT TERM GOALS: Target date: 09/26/2023   Patient will be independent in home exercise program to improve strength/mobility for better functional independence with ADLs. Baseline: initiated 4/4 5/7: compliant Goal status: MET   LONG TERM GOALS: Target date: 10/24/2023    Patient will increase mod ODI  score to equal to or greater than 5 points   to demonstrate statistically significant improvement in mobility and quality of life.  Baseline: to be completed 5/7: 18% Goal status: Partially Met  2.  Patient will tolerate sitting at work for >2 hours without pain and improved posture   Baseline: pain after 1 hour, with severe pain at 4 hours. 5/7: tries to not sit for as long anymore, can sit for longer without pain increase but 2 hours is max  Goal status: Partially Met  3.  Patient will demonstrate ability to wash dishes without pain to improve function at home  Baseline: moderate pain while standing at sink 5/7: moderate pain Goal status: ongoing  4.  Patient will return to running without pain to return to PLOF. Baseline: Pt unable to run since recent surgery  5/7: progressing, has less pain with increased running duration.  Goal status: INITIAL  5.  Patient will increase BUE strength to grossly 4+/5 to 5/5 to allow lifting up child at home without pain  Baseline: see UE MMT, with pain in abdomin 5/7: grossly 4+/5 RUE; LUE 5/5 with extension 4+/5  Goal status: partially met   PLAN:  PT FREQUENCY: 1-2x/week  PT DURATION: 8 weeks  PLANNED INTERVENTIONS: 97110-Therapeutic exercises, 97530- Therapeutic activity, 97112- Neuromuscular re-education, 97535- Self Care, [id number]- Manual therapy, [id number]- Aquatic Therapy, [id number]- Electrical stimulation (unattended), [id number]- Electrical stimulation (manual), [id number]- Vasopneumatic device, [id number]- Ultrasound, [id number]- Traction (mechanical), [id number]- Ionotophoresis 4mg /ml Dexamethasone , Patient/Family education, Balance training, Stair training, Taping, Dry Needling, Joint mobilization, Joint manipulation, Spinal manipulation, Spinal mobilization, Cryotherapy, and Moist heat.  PLAN FOR NEXT SESSION:   Manual for pain management in Tspine and R scapula TDN, Thoracic mobility  Running analysis    Keyonna Comunale, PT 10/06/2023, 4:18 PM

## 2023-10-07 ENCOUNTER — Encounter

## 2023-10-08 ENCOUNTER — Ambulatory Visit: Admitting: Physical Therapy

## 2023-10-09 ENCOUNTER — Encounter

## 2023-10-10 ENCOUNTER — Ambulatory Visit: Admitting: Physical Therapy

## 2023-10-10 DIAGNOSIS — G8929 Other chronic pain: Secondary | ICD-10-CM

## 2023-10-10 DIAGNOSIS — M546 Pain in thoracic spine: Secondary | ICD-10-CM

## 2023-10-10 DIAGNOSIS — R29898 Other symptoms and signs involving the musculoskeletal system: Secondary | ICD-10-CM

## 2023-10-10 NOTE — Therapy (Signed)
 OUTPATIENT PHYSICAL THERAPY THORACOLUMBAR TREATMENT/     Patient Name: Kimberly Delgado MRN: [id number] DOB:05-25-1986, 38 y.o., female Today's Date: 10/10/2023  END OF SESSION:  PT End of Session - 10/10/23 0754     Visit Number 12    Number of Visits 16    Date for PT Re-Evaluation 10/24/23    PT Start Time 0802    PT Stop Time 0845    PT Time Calculation (min) 43 min    Activity Tolerance Patient tolerated treatment well    Behavior During Therapy [facility] for tasks assessed/performed                      Past Medical History:  Diagnosis Date   AMA (advanced maternal age) multigravida 35+    Anxiety    Depression    Exercise-induced asthma    per pt no inhaler   Hernia, umbilical    History of pyelonephritis    Incompetent cervix    12-23-2018  first trimester   PCOS (polycystic ovarian syndrome)    Seasonal allergies    Wears contact lenses    Past Surgical History:  Procedure Laterality Date   ABDOMINAL CERCLAGE N/A 12/25/2018   Procedure: TRANS CERCLAGE ABDOMINAL;  Surgeon: Yalcinkaya, Tamer, MD;  Location: [facility];  Service: Gynecology;  Laterality: N/A;   CERVICAL CERCLAGE N/A 02/17/2015   Procedure: CERCLAGE CERVICAL;  Surgeon: Ivery Marking, MD;  Location: [facility];  Service: Gynecology;  Laterality: N/A;  EDD: 08/16/15   CESAREAN SECTION WITH BILATERAL TUBAL LIGATION Bilateral 07/09/2019   Procedure: Primary CESAREAN SECTION WITH BILATERAL TUBAL LIGATION;  Surgeon: Ivery Marking, MD;  Location: [facility];  Service: Obstetrics;  Laterality: Bilateral;  EDD: 07/14/19 Tracey RNFA   DILATATION & CURETTAGE/HYSTEROSCOPY WITH MYOSURE N/A 05/03/2022   Procedure: DILATATION & CURETTAGE/HYSTEROSCOPY WITH MYOSURE/NOVASURE ABLATION;  Surgeon: Ivery Marking, MD;  Location: [facility];  Service: Gynecology;  Laterality: N/A;   WISDOM TOOTH EXTRACTION     Patient Active Problem List   Diagnosis Date Noted    Request for sterilization 07/09/2019   Postpartum care following cesarean delivery 07/09/2019   SVD (spontaneous vaginal delivery) 08/12/2015   Postpartum care following vaginal delivery (3/18) 08/12/2015   Active labor at term 08/11/2015   Pregnancy 05/14/2015   Cervical incompetence @ 23 weeks 02/26/2013   Threatened preterm labor, antepartum 02/26/2013    PCP: Arva Lathe, MD   REFERRING PROVIDER: Arva Lathe, MD   REFERRING DIAG:  Diagnosis  M62.830 (ICD-10-CM) - Paraspinal muscle spasm    Rationale for Evaluation and Treatment: Rehabilitation  THERAPY DIAG:  Pain in thoracic spine  Chronic right shoulder pain  Shoulder weakness  ONSET DATE: >3 years   SUBJECTIVE:  SUBJECTIVE STATEMENT:  Pt reports that her lower Left back has been feeling a lot better overall. States that she is running more frequently 2-6 miles, 3-4 days a week. No pain at start of PT treatment. Running is making her feel better has not   Patient reports her L glute/back pain continues to flare up, the upper back region is improving between each session.   PERTINENT HISTORY:  Recent Umbilical hernia repair surgery. Will be on precautions until at least 4/15. Pt states that 3 years ago, she reports lifting car seat out of car. Felt like she pulled a muscle and has been hurting off and on since injury. Reports that core strengthening will help pain, but if she doesn't run or perform exercises for a couple days the pain becomes significant at night. Pain is worse after work and after washing dishes at sink in flexed posture.  Pt is an avid runner. Next 1/2 marathon will be in September in [place]    PAIN:  Are you having pain? Yes: NPRS scale: 1/10  Pain location: under the R shoulder blade    Pain description: at worst. Pain is aching/throbbing  Aggravating factors: bending over for long period of time at work Relieving factors: core exercise.   PRECAUTIONS: Other: hernia surgery 8 days ago   RED FLAGS: None   WEIGHT BEARING RESTRICTIONS: no lifting >20lbs until 4/18   FALLS:  Has patient fallen in last 6 months? No  LIVING ENVIRONMENT: Lives with: lives with their family and lives with their spouse Lives in: House/apartment Stairs: Yes: Internal: 15 steps; on right going up and External: 1 steps; on right going up Has following equipment at home: None  OCCUPATION: Medical physists. In [employer]   PLOF: Independent  PATIENT GOALS: decrease back pain   NEXT MD VISIT: 4/15  OBJECTIVE:  Note: Objective measures were completed at Evaluation unless otherwise noted.  DIAGNOSTIC FINDINGS:  DG x ray:  IMPRESSION: Subtle levoscoliosis lumbar spine at the L2-L3 apex with minimal narrowing of the L5-S1 disc space.   PATIENT SURVEYS:  Modified Oswestry to be completed    COGNITION: Overall cognitive status: Within functional limits for tasks assessed     SENSATION: WFL  MUSCLE LENGTH:  POSTURE: mild rounded shoulders.  PALPATION: Noted TP in the R side middle and lower traps as well as distal Lats and teres minor/major. Diffiultying palpating subscapularis due to lat tightness on the RLE  R scapula winging Asymetrical scapula movement with shoulder flexion and abduction   LUMBAR ROM:  Limited assessment due to recent umbilical hernia surgery  AROM eval  Flexion   Extension   Right lateral flexion   Left lateral flexion   Right rotation 25 deg   Left rotation 45 deg   (Blank rows = not tested)  LOWER EXTREMITY ROM:  WFL   Upper  EXTREMITY ROM:  Grossly WFL but noted mild asymmetry in R scapula .    Upper extremity  EXTREMITY MMT:    MMT Right eval Left eval  Shoulder extension *4 *4+  shoulder flexion *4+ *4+  shoulder  abduction 4+ 4+  Shoulder adduction    Hip internal rotation 4+ 4+  Hip external rotation 4+ 4+  elbow flexion    Elbow extension    * Limited from pain abdomin  (Blank rows = not tested)  LUMBAR SPECIAL TESTS:  Recent Umbilical Hernia repair limits full assessment   GAIT: Distance walked: 60 Assistive device utilized: None Level of assistance: Complete  Independence Comments: noted mild shoulder elevation bil decreased trunkal rotation    TREATMENT DATE: 10/10/2023   TherAct: Posterior pelvic 10x; 8-10 second holds  TrA activation in hook lying with tactile feed back from hands for lateral activation x 10 with 3-5 sec hold  Single SLR hold 10-15 sec with posterior pelvic tilt  Dead bug with BLE off bed throughout x 8 bil 3 sec hold.   Single leg paloff press 10x each LE #2.5 weight on cable Side plank with ihp abduction form knee 8 x 3 sec hold bil decreased ROM on the L side.    Manual: Extensive soft tissue release of Bil paraspinals from L2-T6 with PT release at T6-8 bil, L>R Grade II mobilizations thoracic spine and lumbar spine L glute max/med STM with TP release near PSIS.   Trigger Point Dry Needling  Subsequent Treatment: Instructions reviewed, if requested by the patient, prior to subsequent dry needling treatment.   Patient Verbal Consent Given: Yes Education Handout Provided: Previously Provided Muscles Treated: thoraic spinalis/longissimus  Electrical Stimulation Performed: No Treatment Response/Outcome: small twitch response. decrease tightness in L paraspinal region.       PATIENT EDUCATION:  Education details: POC. TDN.  Person educated: Patient Education method: Explanation Education comprehension: verbalized understanding  HOME EXERCISE PROGRAM: Access Code: [email or web address] URL: [email or web address] Date: 08/29/2023 Prepared by: Aurora Lees  Exercises - Supine Shoulder External Rotation with Resistance  - 1 x daily - 5 x weekly -  3 sets - 10 reps - 3 hold - Hooklying Shoulder Y  - 1 x daily - 5 x weekly - 3 sets - 10 reps - 2 hold - Supine Isometric Shoulder Extension with Towel  - 1 x daily - 5 x weekly - 3 sets - 10 reps - 3 hold  Trigger Point Dry Needling  What is Trigger Point Dry Needling (DN)? DN is a physical therapy technique used to treat muscle pain and dysfunction. Specifically, DN helps deactivate muscle trigger points (muscle knots).  A thin filiform needle is used to penetrate the skin and stimulate the underlying trigger point. The goal is for a local twitch response (LTR) to occur and for the trigger point to relax. No medication of any kind is injected during the procedure.   What Does Trigger Point Dry Needling Feel Like?  The procedure feels different for each individual patient. Some patients report that they do not actually feel the needle enter the skin and overall the process is not painful. Very mild bleeding may occur. However, many patients feel a deep cramping in the muscle in which the needle was inserted. This is the local twitch response.   How Will I feel after the treatment? Soreness is normal, and the onset of soreness may not occur for a few hours. Typically this soreness does not last longer than two days.  Bruising is uncommon, however; ice can be used to decrease any possible bruising.  In rare cases feeling tired or nauseous after the treatment is normal. In addition, your symptoms may get worse before they get better, this period will typically not last longer than 24 hours.   What Can I do After My Treatment? Increase your hydration by drinking more water  for the next 24 hours.  You may place ice or heat on the areas treated that have become sore, however, do not use heat on inflamed or bruised areas. Heat often brings more relief post needling. You can continue your regular activities, but vigorous activity is not  recommended initially after the treatment for 24 hours. DN is  best combined with other physical therapy such as strengthening, stretching, and other therapies.   What are the complications? While your therapist has had extensive training in minimizing the risks of trigger point dry needling, it is important to understand the risks of any procedure.  Risks include bleeding, pain, fatigue, hematoma, infection, vertigo, nausea or nerve involvement. Monitor for any changes to your skin or sensation. Contact your therapist or MD with concerns.  A rare but serious complication is a pneumothorax over or near your middle and upper chest and back If you have dry needling in this area, monitor for the following symptoms: Shortness of breath on exertion and/or Difficulty taking a deep breath and/or Chest Pain and/or A dry cough If any of the above symptoms develop, please go to the nearest emergency room or call 911. Tell them you had dry needling over your thorax and report any symptoms you are having. Please follow-up with your treating therapist after you complete the medical evaluation.   ASSESSMENT:  CLINICAL IMPRESSION: Patient reports decreased pain in parascapular muscles and L hip on this day. Treatment continued to focus on improved core activation and stability with decreased paraspinal compensation. STM and TDN performed to L side paraspinals. Feels much better following stabilization exercises and decreased tension in back following manual and TDN. Pt will benefit from skilled PT to address Tspine and R shoulder pain to allow improved QoL and return to PLOF.    OBJECTIVE IMPAIRMENTS: decreased strength, hypomobility, impaired perceived functional ability, improper body mechanics, postural dysfunction, and pain.   ACTIVITY LIMITATIONS: carrying, lifting, squatting, bed mobility, reach over head, hygiene/grooming, and caring for others  PARTICIPATION LIMITATIONS: meal prep, cleaning, laundry, community activity, occupation, and yard work  PERSONAL  FACTORS: Fitness and Time since onset of injury/illness/exacerbation are also affecting patient's functional outcome.   REHAB POTENTIAL: Excellent  CLINICAL DECISION MAKING: Stable/uncomplicated  EVALUATION COMPLEXITY: Low   GOALS: Goals reviewed with patient? Yes  SHORT TERM GOALS: Target date: 09/26/2023   Patient will be independent in home exercise program to improve strength/mobility for better functional independence with ADLs. Baseline: initiated 4/4 5/7: compliant Goal status: MET   LONG TERM GOALS: Target date: 10/24/2023    Patient will increase mod ODI  score to equal to or greater than 5 points   to demonstrate statistically significant improvement in mobility and quality of life.  Baseline: to be completed 5/7: 18% Goal status: Partially Met  2.  Patient will tolerate sitting at work for >2 hours without pain and improved posture  Baseline: pain after 1 hour, with severe pain at 4 hours. 5/7: tries to not sit for as long anymore, can sit for longer without pain increase but 2 hours is max  Goal status: Partially Met  3.  Patient will demonstrate ability to wash dishes without pain to improve function at home  Baseline: moderate pain while standing at sink 5/7: moderate pain Goal status: ongoing  4.  Patient will return to running without pain to return to PLOF. Baseline: Pt unable to run since recent surgery  5/7: progressing, has less pain with increased running duration.  Goal status: INITIAL  5.  Patient will increase BUE strength to grossly 4+/5 to 5/5 to allow lifting up child at home without pain  Baseline: see UE MMT, with pain in abdomin 5/7: grossly 4+/5 RUE; LUE 5/5 with extension 4+/5  Goal status: partially met   PLAN:  PT FREQUENCY: 1-2x/week  PT DURATION: 8 weeks  PLANNED INTERVENTIONS: 97110-Therapeutic exercises, 97530- Therapeutic activity, [id number]- Neuromuscular re-education, 97535- Self Care, 16109- Manual therapy, [phone]- Aquatic Therapy,  [phone]- Electrical stimulation (unattended), [phone]- Electrical stimulation (manual), [id number]- Vasopneumatic device, [id number]- Ultrasound, [id number]- Traction (mechanical), [id number]- Ionotophoresis 4mg /ml Dexamethasone , Patient/Family education, Balance training, Stair training, Taping, Dry Needling, Joint mobilization, Joint manipulation, Spinal manipulation, Spinal mobilization, Cryotherapy, and Moist heat.  PLAN FOR NEXT SESSION:   Manual for pain management in Tspine and R scapula TDN, Thoracic mobility  Running analysis    Barbara Book, PT 10/10/2023, 7:55 AM

## 2023-10-13 ENCOUNTER — Ambulatory Visit: Admitting: Physical Therapy

## 2023-10-13 DIAGNOSIS — M546 Pain in thoracic spine: Secondary | ICD-10-CM | POA: Diagnosis not present

## 2023-10-13 DIAGNOSIS — G8929 Other chronic pain: Secondary | ICD-10-CM

## 2023-10-13 DIAGNOSIS — R29898 Other symptoms and signs involving the musculoskeletal system: Secondary | ICD-10-CM

## 2023-10-13 NOTE — Therapy (Signed)
 OUTPATIENT PHYSICAL THERAPY THORACOLUMBAR TREATMENT/     Patient Name: HEDAYA LATENDRESSE MRN: [id number] DOB:[birth year], 38 y.o., female Today's Date: 10/13/2023  END OF SESSION:  PT End of Session - 10/13/23 0804     Visit Number 13    Number of Visits 16    Date for PT Re-Evaluation 10/24/23    PT Start Time 0803    PT Stop Time 0845    PT Time Calculation (min) 42 min    Activity Tolerance Patient tolerated treatment well    Behavior During Therapy [facility] for tasks assessed/performed                      Past Medical History:  Diagnosis Date   AMA (advanced maternal age) multigravida 35+    Anxiety    Depression    Exercise-induced asthma    per pt no inhaler   Hernia, umbilical    History of pyelonephritis    Incompetent cervix    12-23-2018  first trimester   PCOS (polycystic ovarian syndrome)    Seasonal allergies    Wears contact lenses    Past Surgical History:  Procedure Laterality Date   ABDOMINAL CERCLAGE N/A 12/25/2018   Procedure: TRANS CERCLAGE ABDOMINAL;  Surgeon: Yalcinkaya, Tamer, MD;  Location: [facility];  Service: Gynecology;  Laterality: N/A;   CERVICAL CERCLAGE N/A 02/17/2015   Procedure: CERCLAGE CERVICAL;  Surgeon: Ivery Marking, MD;  Location: [facility];  Service: Gynecology;  Laterality: N/A;  EDD: 08/16/15   CESAREAN SECTION WITH BILATERAL TUBAL LIGATION Bilateral 07/09/2019   Procedure: Primary CESAREAN SECTION WITH BILATERAL TUBAL LIGATION;  Surgeon: Ivery Marking, MD;  Location: [facility];  Service: Obstetrics;  Laterality: Bilateral;  EDD: 07/14/19 Tracey RNFA   DILATATION & CURETTAGE/HYSTEROSCOPY WITH MYOSURE N/A 05/03/2022   Procedure: DILATATION & CURETTAGE/HYSTEROSCOPY WITH MYOSURE/NOVASURE ABLATION;  Surgeon: Ivery Marking, MD;  Location: [facility];  Service: Gynecology;  Laterality: N/A;   WISDOM TOOTH EXTRACTION     Patient Active Problem List   Diagnosis Date Noted    Request for sterilization 07/09/2019   Postpartum care following cesarean delivery 07/09/2019   SVD (spontaneous vaginal delivery) 08/12/2015   Postpartum care following vaginal delivery (3/18) 08/12/2015   Active labor at term 08/11/2015   Pregnancy 05/14/2015   Cervical incompetence @ 23 weeks 02/26/2013   Threatened preterm labor, antepartum 02/26/2013    PCP: Arva Lathe, MD   REFERRING PROVIDER: Arva Lathe, MD   REFERRING DIAG:  Diagnosis  M62.830 (ICD-10-CM) - Paraspinal muscle spasm    Rationale for Evaluation and Treatment: Rehabilitation  THERAPY DIAG:  Pain in thoracic spine  Chronic right shoulder pain  Shoulder weakness  ONSET DATE: >3 years   SUBJECTIVE:  SUBJECTIVE STATEMENT:  Pt reports that she is sore this AM with increased pain in the R parascapular and her lower Left back/Leg. States R shoulder/upper back 6/10 last night. States that pain is higher because she performed new HIIT workout Friday, went for a long run(4 miles) Saturday, . And performed extensive  has been feeling a lot better overall. States that she is running more frequently 2-6 miles, 3-4 days a week. No pain at start of PT treatment. Running is making her feel better has not   Patient reports her L glute/back pain continues to flare up, the upper back region is improving between each session.   PERTINENT HISTORY:  Recent Umbilical hernia repair surgery. Will be on precautions until at least 4/15. Pt states that 3 years ago, she reports lifting car seat out of car. Felt like she pulled a muscle and has been hurting off and on since injury. Reports that core strengthening will help pain, but if she doesn't run or perform exercises for a couple days the pain becomes significant at night.  Pain is worse after work and after washing dishes at sink in flexed posture.  Pt is an avid runner. Next 1/2 marathon will be in September in [place]    PAIN:  Are you having pain? Yes: NPRS scale: 1/10  Pain location: under the R shoulder blade   Pain description: at worst. Pain is aching/throbbing  Aggravating factors: bending over for long period of time at work Relieving factors: core exercise.   PRECAUTIONS: Other: hernia surgery 8 days ago   RED FLAGS: None   WEIGHT BEARING RESTRICTIONS: no lifting >20lbs until 4/18   FALLS:  Has patient fallen in last 6 months? 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  DIAGNOSTIC FINDINGS:  DG x ray:  IMPRESSION: Subtle levoscoliosis lumbar spine at the L2-L3 apex with minimal narrowing of the L5-S1 disc space.   PATIENT SURVEYS:  Modified Oswestry to be completed    COGNITION: Overall cognitive status: Within functional limits for tasks assessed     SENSATION: WFL  MUSCLE LENGTH:  POSTURE: mild rounded shoulders.  PALPATION: Noted TP in the R side middle and lower traps as well as distal Lats and teres minor/major. Diffiultying palpating subscapularis due to lat tightness on the RLE  R scapula winging Asymetrical scapula movement with shoulder flexion and abduction   LUMBAR ROM:  Limited assessment due to recent umbilical hernia surgery  AROM eval  Flexion   Extension   Right lateral flexion   Left lateral flexion   Right rotation 25 deg   Left rotation 45 deg   (Blank rows = not  tested)  LOWER EXTREMITY ROM:  WFL   Upper  EXTREMITY ROM:  Grossly WFL but noted mild asymmetry in R scapula .    Upper extremity  EXTREMITY MMT:    MMT Right eval Left eval  Shoulder extension *4 *4+  shoulder flexion *4+ *4+  shoulder abduction 4+ 4+  Shoulder adduction    Hip internal rotation 4+ 4+  Hip external rotation 4+ 4+  elbow flexion  Elbow extension    * Limited from pain abdomin  (Blank rows = not tested)  LUMBAR SPECIAL TESTS:  Recent Umbilical Hernia repair limits full assessment   GAIT: Distance walked: 60 Assistive device utilized: None Level of assistance: Complete Independence Comments: noted mild shoulder elevation bil decreased trunkal rotation    TREATMENT DATE: 10/13/2023  TE:   LTR 10 sec hold x 10  Archer stretch, 5 sec hold x 8 bil  Overhead stretch with PVC, x5 neutral, x5 with scapular activation performed for 5 sec hold on each.  ER with scapular setting with RTB x 12  TA: Qped:  Cat/cow x 10 each 3 sec hold  Reach through/thoracic opening, 3 sec hold x 10 bil  Childs pose 2x 15 sec  Bear crawl hold with scapular activation 10 sec hold x 5 Cues for TrA activation and proper scapular setting.    Manual: Extensive soft tissue release of R periscapular region, R medial and lateral border of scapula release x 13 minutes Grade II mobilizations thoracic spine    PATIENT EDUCATION:  Education details: POC. TDN. Pt educated throughout session about proper posture and technique with exercises. Improved exercise technique, movement at target joints, use of target muscles after min to mod verbal, visual, tactile cues.  Person educated: Patient Education method: Explanation Education comprehension: verbalized understanding  HOME EXERCISE PROGRAM: Access Code: [email or web address] URL: [email or web address] Date: 08/29/2023 Prepared by: Aurora Lees  Exercises - Supine Shoulder External Rotation with Resistance  - 1 x daily -  5 x weekly - 3 sets - 10 reps - 3 hold - Hooklying Shoulder Y  - 1 x daily - 5 x weekly - 3 sets - 10 reps - 2 hold - Supine Isometric Shoulder Extension with Towel  - 1 x daily - 5 x weekly - 3 sets - 10 reps - 3 hold   ASSESSMENT:  CLINICAL IMPRESSION: Patient reports increased pain in the R shoulder after busy weekend and reduced time performed HEP. PT treatment focused on thoracic/parascapular mobility and stability. Along with STM to TP management. Pt reports decreased pain upon completion of PT treatment in the R shoulder. Was noted to also have increased pain in the LLE, but treatment focused on greater pain in the R shoulder. Will continue to address pain as indicated.  Pt will benefit from skilled PT to address Tspine and R shoulder pain to allow improved QoL and return to PLOF.    OBJECTIVE IMPAIRMENTS: decreased strength, hypomobility, impaired perceived functional ability, improper body mechanics, postural dysfunction, and pain.   ACTIVITY LIMITATIONS: carrying, lifting, squatting, bed mobility, reach over head, hygiene/grooming, and caring for others  PARTICIPATION LIMITATIONS: meal prep, cleaning, laundry, community activity, occupation, and yard work  PERSONAL FACTORS: Fitness and Time since onset of injury/illness/exacerbation are also affecting patient's functional outcome.   REHAB POTENTIAL: Excellent  CLINICAL DECISION MAKING: Stable/uncomplicated  EVALUATION COMPLEXITY: Low   GOALS: Goals reviewed with patient? Yes  SHORT TERM GOALS: Target date: 09/26/2023   Patient will be independent in home exercise program to improve strength/mobility for better functional independence with ADLs. Baseline: initiated 4/4 5/7: compliant Goal status: MET   LONG TERM GOALS: Target date: 10/24/2023    Patient will increase mod ODI  score to equal to or greater than 5 points   to demonstrate statistically significant improvement in mobility and quality of life.  Baseline: to  be completed 5/7: 18% Goal status: Partially Met  2.  Patient will tolerate sitting at work for >  2 hours without pain and improved posture  Baseline: pain after 1 hour, with severe pain at 4 hours. 5/7: tries to not sit for as long anymore, can sit for longer without pain increase but 2 hours is max  Goal status: Partially Met  3.  Patient will demonstrate ability to wash dishes without pain to improve function at home  Baseline: moderate pain while standing at sink 5/7: moderate pain Goal status: ongoing  4.  Patient will return to running without pain to return to PLOF. Baseline: Pt unable to run since recent surgery  5/7: progressing, has less pain with increased running duration.  Goal status: INITIAL  5.  Patient will increase BUE strength to grossly 4+/5 to 5/5 to allow lifting up child at home without pain  Baseline: see UE MMT, with pain in abdomin 5/7: grossly 4+/5 RUE; LUE 5/5 with extension 4+/5  Goal status: partially met   PLAN:  PT FREQUENCY: 1-2x/week  PT DURATION: 8 weeks  PLANNED INTERVENTIONS: 97110-Therapeutic exercises, 97530- Therapeutic activity, 97112- Neuromuscular re-education, 97535- Self Care, 91478- Manual therapy, [id number]- Aquatic Therapy, [id number]- Electrical stimulation (unattended), [id number]- Electrical stimulation (manual), 97016- Vasopneumatic device, [id number]- Ultrasound, [id number]- Traction (mechanical), [id number]- Ionotophoresis 4mg /ml Dexamethasone , Patient/Family education, Balance training, Stair training, Taping, Dry Needling, Joint mobilization, Joint manipulation, Spinal manipulation, Spinal mobilization, Cryotherapy, and Moist heat.  PLAN FOR NEXT SESSION:   Manual for pain management in Tspine and R scapula TDN, Thoracic mobility  Running analysis when appropriate.   Barbara Book, PT 10/13/2023, 8:07 AM

## 2023-10-14 ENCOUNTER — Encounter

## 2023-10-15 NOTE — Therapy (Signed)
 OUTPATIENT PHYSICAL THERAPY THORACOLUMBAR TREATMENT/     Patient Name: Kimberly Delgado MRN: [id number] DOB:30-Nov-1985, 38 y.o., female Today's Date: 10/16/2023  END OF SESSION:  PT End of Session - 10/16/23 0716     Visit Number 14    Number of Visits 16    Date for PT Re-Evaluation 10/24/23    PT Start Time 0715    PT Stop Time 0759    PT Time Calculation (min) 44 min    Activity Tolerance Patient tolerated treatment well    Behavior During Therapy [facility] for tasks assessed/performed                       Past Medical History:  Diagnosis Date   AMA (advanced maternal age) multigravida 35+    Anxiety    Depression    Exercise-induced asthma    per pt no inhaler   Hernia, umbilical    History of pyelonephritis    Incompetent cervix    12-23-2018  first trimester   PCOS (polycystic ovarian syndrome)    Seasonal allergies    Wears contact lenses    Past Surgical History:  Procedure Laterality Date   ABDOMINAL CERCLAGE N/A 12/25/2018   Procedure: TRANS CERCLAGE ABDOMINAL;  Surgeon: Yalcinkaya, Tamer, MD;  Location: [facility];  Service: Gynecology;  Laterality: N/A;   CERVICAL CERCLAGE N/A 02/17/2015   Procedure: CERCLAGE CERVICAL;  Surgeon: Ivery Marking, MD;  Location: [facility];  Service: Gynecology;  Laterality: N/A;  EDD: 08/16/15   CESAREAN SECTION WITH BILATERAL TUBAL LIGATION Bilateral 07/09/2019   Procedure: Primary CESAREAN SECTION WITH BILATERAL TUBAL LIGATION;  Surgeon: Ivery Marking, MD;  Location: [facility];  Service: Obstetrics;  Laterality: Bilateral;  EDD: 07/14/19 Tracey RNFA   DILATATION & CURETTAGE/HYSTEROSCOPY WITH MYOSURE N/A 05/03/2022   Procedure: DILATATION & CURETTAGE/HYSTEROSCOPY WITH MYOSURE/NOVASURE ABLATION;  Surgeon: Ivery Marking, MD;  Location: [facility];  Service: Gynecology;  Laterality: N/A;   WISDOM TOOTH EXTRACTION     Patient Active Problem List   Diagnosis Date Noted    Request for sterilization 07/09/2019   Postpartum care following cesarean delivery 07/09/2019   SVD (spontaneous vaginal delivery) 08/12/2015   Postpartum care following vaginal delivery (3/18) 08/12/2015   Active labor at term 08/11/2015   Pregnancy 05/14/2015   Cervical incompetence @ 23 weeks 02/26/2013   Threatened preterm labor, antepartum 02/26/2013    PCP: Arva Lathe, MD   REFERRING PROVIDER: Arva Lathe, MD   REFERRING DIAG:  Diagnosis  M62.830 (ICD-10-CM) - Paraspinal muscle spasm    Rationale for Evaluation and Treatment: Rehabilitation  THERAPY DIAG:  Pain in thoracic spine  Chronic right shoulder pain  Shoulder weakness  ONSET DATE: >3 years   SUBJECTIVE:  SUBJECTIVE STATEMENT:  Patient reports she is still overdoing it and recovering from the weekend. Her back and hip continue to bother her.   PERTINENT HISTORY:  Recent Umbilical hernia repair surgery. Will be on precautions until at least 4/15. Pt states that 3 years ago, she reports lifting car seat out of car. Felt like she pulled a muscle and has been hurting off and on since injury. Reports that core strengthening will help pain, but if she doesn't run or perform exercises for a couple days the pain becomes significant at night. Pain is worse after work and after washing dishes at sink in flexed posture.  Pt is an avid runner. Next 1/2 marathon will be in September in [place]    PAIN:  Are you having pain? Yes: NPRS scale: 1/10  Pain location: under the R shoulder blade   Pain description: at worst. Pain is aching/throbbing  Aggravating factors: bending over for long period of time at work Relieving factors: core exercise.   PRECAUTIONS: Other: hernia surgery 8 days ago   RED  FLAGS: None   WEIGHT BEARING RESTRICTIONS: no lifting >20lbs until 4/18   FALLS:  Has patient fallen in last 6 months? No  LIVING ENVIRONMENT: Lives with: lives with their family and lives with their spouse Lives in: House/apartment Stairs: Yes: Internal: 15 steps; on right going up and External: 1 steps; on right going up Has following equipment at home: None  OCCUPATION: Medical physists. In [employer]   PLOF: Independent  PATIENT GOALS: decrease back pain   NEXT MD VISIT: 4/15  OBJECTIVE:  Note: Objective measures were completed at Evaluation unless otherwise noted.  DIAGNOSTIC FINDINGS:  DG x ray:  IMPRESSION: Subtle levoscoliosis lumbar spine at the L2-L3 apex with minimal narrowing of the L5-S1 disc space.   PATIENT SURVEYS:  Modified Oswestry to be completed    COGNITION: Overall cognitive status: Within functional limits for tasks assessed     SENSATION: WFL  MUSCLE LENGTH:  POSTURE: mild rounded shoulders.  PALPATION: Noted TP in the R side middle and lower traps as well as distal Lats and teres minor/major. Diffiultying palpating subscapularis due to lat tightness on the RLE  R scapula winging Asymetrical scapula movement with shoulder flexion and abduction   LUMBAR ROM:  Limited assessment due to recent umbilical hernia surgery  AROM eval  Flexion   Extension   Right lateral flexion   Left lateral flexion   Right rotation 25 deg   Left rotation 45 deg   (Blank rows = not tested)  LOWER EXTREMITY ROM:  WFL   Upper  EXTREMITY ROM:  Grossly WFL but noted mild asymmetry in R scapula .    Upper extremity  EXTREMITY MMT:    MMT Right eval Left eval  Shoulder extension *4 *4+  shoulder flexion *4+ *4+  shoulder abduction 4+ 4+  Shoulder adduction    Hip internal rotation 4+ 4+  Hip external rotation 4+ 4+  elbow flexion    Elbow extension    * Limited from pain abdomin  (Blank rows = not tested)  LUMBAR SPECIAL TESTS:   Recent Umbilical Hernia repair limits full assessment   GAIT: Distance walked: 60 Assistive device utilized: None Level of assistance: Complete Independence Comments: noted mild shoulder elevation bil decreased trunkal rotation    TREATMENT DATE: 10/16/2023   TA: Bear hold 10x; 5 second holds cues for hand placement and core activation Child pose 30 seconds x 2 trials Leg abducted child pose  30 seconds each LE Long sit SLR 10x each LE : terminated on LLE due to quad strain.  Half kneeling stretch 30 seconds cues for upright posture Hip mobility drill: 10x each side; single leg raise 10x each side Wall pec stretch 3x30 seconds L stretch on wall 3x30 seconds   Manual: Grade II mobilizations thoracic spine   Trigger Point Dry Needling  Subsequent Treatment: Instructions provided previously at initial dry needling treatment.   Patient Verbal Consent Given: Yes Education Handout Provided: Previously Provided Muscles Treated: bilateral upper trap and bilateral glutes  Electrical Stimulation Performed: No Treatment Response/Outcome: excellent twitch response     PATIENT EDUCATION:  Education details: POC. TDN. Pt educated throughout session about proper posture and technique with exercises. Improved exercise technique, movement at target joints, use of target muscles after min to mod verbal, visual, tactile cues.  Person educated: Patient Education method: Explanation Education comprehension: verbalized understanding  HOME EXERCISE PROGRAM: Access Code: [email or web address] URL: [email or web address] Date: 08/29/2023 Prepared by: Aurora Lees  Exercises - Supine Shoulder External Rotation with Resistance  - 1 x daily - 5 x weekly - 3 sets - 10 reps - 3 hold - Hooklying Shoulder Y  - 1 x daily - 5 x weekly - 3 sets - 10 reps - 2 hold - Supine Isometric Shoulder Extension with Towel  - 1 x daily - 5 x weekly - 3 sets - 10 reps - 3 hold   ASSESSMENT:  CLINICAL  IMPRESSION: Progression of hip and core mobility interventions tolerated well. She initially is challenged with hip mobility drill but improves with repetition with report of relief of symptoms by end of session. Focus core and trunk positioning with mobility requires intermittent cueing.  Pt will benefit from skilled PT to address Tspine and R shoulder pain to allow improved QoL and return to PLOF.    OBJECTIVE IMPAIRMENTS: decreased strength, hypomobility, impaired perceived functional ability, improper body mechanics, postural dysfunction, and pain.   ACTIVITY LIMITATIONS: carrying, lifting, squatting, bed mobility, reach over head, hygiene/grooming, and caring for others  PARTICIPATION LIMITATIONS: meal prep, cleaning, laundry, community activity, occupation, and yard work  PERSONAL FACTORS: Fitness and Time since onset of injury/illness/exacerbation are also affecting patient's functional outcome.   REHAB POTENTIAL: Excellent  CLINICAL DECISION MAKING: Stable/uncomplicated  EVALUATION COMPLEXITY: Low   GOALS: Goals reviewed with patient? Yes  SHORT TERM GOALS: Target date: 09/26/2023   Patient will be independent in home exercise program to improve strength/mobility for better functional independence with ADLs. Baseline: initiated 4/4 5/7: compliant Goal status: MET   LONG TERM GOALS: Target date: 10/24/2023    Patient will increase mod ODI  score to equal to or greater than 5 points   to demonstrate statistically significant improvement in mobility and quality of life.  Baseline: to be completed 5/7: 18% Goal status: Partially Met  2.  Patient will tolerate sitting at work for >2 hours without pain and improved posture  Baseline: pain after 1 hour, with severe pain at 4 hours. 5/7: tries to not sit for as long anymore, can sit for longer without pain increase but 2 hours is max  Goal status: Partially Met  3.  Patient will demonstrate ability to wash dishes without pain  to improve function at home  Baseline: moderate pain while standing at sink 5/7: moderate pain Goal status: ongoing  4.  Patient will return to running without pain to return to PLOF. Baseline: Pt unable to run since recent surgery  5/7: progressing,  has less pain with increased running duration.  Goal status: INITIAL  5.  Patient will increase BUE strength to grossly 4+/5 to 5/5 to allow lifting up child at home without pain  Baseline: see UE MMT, with pain in abdomin 5/7: grossly 4+/5 RUE; LUE 5/5 with extension 4+/5  Goal status: partially met   PLAN:  PT FREQUENCY: 1-2x/week  PT DURATION: 8 weeks  PLANNED INTERVENTIONS: 97110-Therapeutic exercises, 97530- Therapeutic activity, 97112- Neuromuscular re-education, 97535- Self Care, 65784- Manual therapy, [id number]- Aquatic Therapy, O9629- Electrical stimulation (unattended), [id number]- Electrical stimulation (manual), 97016- Vasopneumatic device, [id number]- Ultrasound, [id number]- Traction (mechanical), [id number]- Ionotophoresis 4mg /ml Dexamethasone , Patient/Family education, Balance training, Stair training, Taping, Dry Needling, Joint mobilization, Joint manipulation, Spinal manipulation, Spinal mobilization, Cryotherapy, and Moist heat.  PLAN FOR NEXT SESSION:   Manual for pain management in Tspine and R scapula TDN, Thoracic mobility  Running analysis when appropriate.   Gedalia Mcmillon, PT 10/16/2023, 7:59 AM

## 2023-10-16 ENCOUNTER — Ambulatory Visit

## 2023-10-16 ENCOUNTER — Encounter

## 2023-10-16 DIAGNOSIS — M546 Pain in thoracic spine: Secondary | ICD-10-CM

## 2023-10-16 DIAGNOSIS — R29898 Other symptoms and signs involving the musculoskeletal system: Secondary | ICD-10-CM

## 2023-10-16 DIAGNOSIS — G8929 Other chronic pain: Secondary | ICD-10-CM

## 2023-10-16 NOTE — Therapy (Signed)
 OUTPATIENT PHYSICAL THERAPY THORACOLUMBAR TREATMENT/     Patient Name: Kimberly Delgado MRN: [id number] DOB:Apr 09, 1986, 38 y.o., female Today's Date: 10/21/2023  END OF SESSION:  PT End of Session - 10/21/23 0712     Visit Number 15    Number of Visits 16    Date for PT Re-Evaluation 10/24/23    PT Start Time 0713    PT Stop Time 0758    PT Time Calculation (min) 45 min    Activity Tolerance Patient tolerated treatment well    Behavior During Therapy [facility] for tasks assessed/performed                        Past Medical History:  Diagnosis Date   AMA (advanced maternal age) multigravida 35+    Anxiety    Depression    Exercise-induced asthma    per pt no inhaler   Hernia, umbilical    History of pyelonephritis    Incompetent cervix    12-23-2018  first trimester   PCOS (polycystic ovarian syndrome)    Seasonal allergies    Wears contact lenses    Past Surgical History:  Procedure Laterality Date   ABDOMINAL CERCLAGE N/A 12/25/2018   Procedure: TRANS CERCLAGE ABDOMINAL;  Surgeon: Yalcinkaya, Tamer, MD;  Location: [facility];  Service: Gynecology;  Laterality: N/A;   CERVICAL CERCLAGE N/A 02/17/2015   Procedure: CERCLAGE CERVICAL;  Surgeon: Ivery Marking, MD;  Location: [facility];  Service: Gynecology;  Laterality: N/A;  EDD: 08/16/15   CESAREAN SECTION WITH BILATERAL TUBAL LIGATION Bilateral 07/09/2019   Procedure: Primary CESAREAN SECTION WITH BILATERAL TUBAL LIGATION;  Surgeon: Ivery Marking, MD;  Location: [facility];  Service: Obstetrics;  Laterality: Bilateral;  EDD: 07/14/19 Tracey RNFA   DILATATION & CURETTAGE/HYSTEROSCOPY WITH MYOSURE N/A 05/03/2022   Procedure: DILATATION & CURETTAGE/HYSTEROSCOPY WITH MYOSURE/NOVASURE ABLATION;  Surgeon: Ivery Marking, MD;  Location: [facility];  Service: Gynecology;  Laterality: N/A;   WISDOM TOOTH EXTRACTION     Patient Active Problem List   Diagnosis Date Noted    Request for sterilization 07/09/2019   Postpartum care following cesarean delivery 07/09/2019   SVD (spontaneous vaginal delivery) 08/12/2015   Postpartum care following vaginal delivery (3/18) 08/12/2015   Active labor at term 08/11/2015   Pregnancy 05/14/2015   Cervical incompetence @ 23 weeks 02/26/2013   Threatened preterm labor, antepartum 02/26/2013    PCP: Arva Lathe, MD   REFERRING PROVIDER: Arva Lathe, MD   REFERRING DIAG:  Diagnosis  M62.830 (ICD-10-CM) - Paraspinal muscle spasm    Rationale for Evaluation and Treatment: Rehabilitation  THERAPY DIAG:  Pain in thoracic spine  Chronic right shoulder pain  Shoulder weakness  ONSET DATE: >3 years   SUBJECTIVE:  SUBJECTIVE STATEMENT:  Patient went on vacation. Stayed in a tiny house so didn't do strength training or stretching. Reports her quad and back is hurting.   PERTINENT HISTORY:  Recent Umbilical hernia repair surgery. Will be on precautions until at least 4/15. Pt states that 3 years ago, she reports lifting car seat out of car. Felt like she pulled a muscle and has been hurting off and on since injury. Reports that core strengthening will help pain, but if she doesn't run or perform exercises for a couple days the pain becomes significant at night. Pain is worse after work and after washing dishes at sink in flexed posture.  Pt is an avid runner. Next 1/2 marathon will be in September in [place]    PAIN:  Are you having pain? Yes: NPRS scale: 1/10  Pain location: under the R shoulder blade   Pain description: at worst. Pain is aching/throbbing  Aggravating factors: bending over for long period of time at work Relieving factors: core exercise.   PRECAUTIONS: Other: hernia surgery 8 days ago    RED FLAGS: None   WEIGHT BEARING RESTRICTIONS: no lifting >20lbs until 4/18   FALLS:  Has patient fallen in last 6 months? No  LIVING ENVIRONMENT: Lives with: lives with their family and lives with their spouse Lives in: House/apartment Stairs: Yes: Internal: 15 steps; on right going up and External: 1 steps; on right going up Has following equipment at home: None  OCCUPATION: Medical physists. In [employer]   PLOF: Independent  PATIENT GOALS: decrease back pain   NEXT MD VISIT: 4/15  OBJECTIVE:  Note: Objective measures were completed at Evaluation unless otherwise noted.  DIAGNOSTIC FINDINGS:  DG x ray:  IMPRESSION: Subtle levoscoliosis lumbar spine at the L2-L3 apex with minimal narrowing of the L5-S1 disc space.   PATIENT SURVEYS:  Modified Oswestry to be completed    COGNITION: Overall cognitive status: Within functional limits for tasks assessed     SENSATION: WFL  MUSCLE LENGTH:  POSTURE: mild rounded shoulders.  PALPATION: Noted TP in the R side middle and lower traps as well as distal Lats and teres minor/major. Diffiultying palpating subscapularis due to lat tightness on the RLE  R scapula winging Asymetrical scapula movement with shoulder flexion and abduction   LUMBAR ROM:  Limited assessment due to recent umbilical hernia surgery  AROM eval  Flexion   Extension   Right lateral flexion   Left lateral flexion   Right rotation 25 deg   Left rotation 45 deg   (Blank rows = not tested)  LOWER EXTREMITY ROM:  WFL   Upper  EXTREMITY ROM:  Grossly WFL but noted mild asymmetry in R scapula .    Upper extremity  EXTREMITY MMT:    MMT Right eval Left eval  Shoulder extension *4 *4+  shoulder flexion *4+ *4+  shoulder abduction 4+ 4+  Shoulder adduction    Hip internal rotation 4+ 4+  Hip external rotation 4+ 4+  elbow flexion    Elbow extension    * Limited from pain abdomin  (Blank rows = not tested)  LUMBAR SPECIAL  TESTS:  Recent Umbilical Hernia repair limits full assessment   GAIT: Distance walked: 60 Assistive device utilized: None Level of assistance: Complete Independence Comments: noted mild shoulder elevation bil decreased trunkal rotation    TREATMENT DATE: 10/21/2023   TA: Tall kneeling circles 10x each direction with 15 kg KB Bear hold 10x; 5 second holds cues for hand placement and  core activation Child pose 30 seconds x 2 trials RDL with 15 lb KB 10x each LE Quadruped reaches 10x each side (under/through and reach above and overhead) Wall pec stretch 3x30 seconds   Manual: Grade II mobilizations thoracic spine x multiple reps   Trigger Point Dry Needling  Subsequent Treatment: Instructions provided previously at initial dry needling treatment.   Patient Verbal Consent Given: Yes Education Handout Provided: Previously Provided Muscles Treated: bilateral upper trap and L quad Electrical Stimulation Performed: No Treatment Response/Outcome: excellent twitch response     PATIENT EDUCATION:  Education details: POC. TDN. Pt educated throughout session about proper posture and technique with exercises. Improved exercise technique, movement at target joints, use of target muscles after min to mod verbal, visual, tactile cues.  Person educated: Patient Education method: Explanation Education comprehension: verbalized understanding  HOME EXERCISE PROGRAM: Access Code: [email or web address] URL: [email or web address] Date: 08/29/2023 Prepared by: Aurora Lees  Exercises - Supine Shoulder External Rotation with Resistance  - 1 x daily - 5 x weekly - 3 sets - 10 reps - 3 hold - Hooklying Shoulder Y  - 1 x daily - 5 x weekly - 3 sets - 10 reps - 2 hold - Supine Isometric Shoulder Extension with Towel  - 1 x daily - 5 x weekly - 3 sets - 10 reps - 3 hold   ASSESSMENT:  CLINICAL IMPRESSION: Patient initially presents with L quad pain radiating to midback with rotation type  patterning. Muscle tension reduction strategies in combination with strengthening techniques reduced compensatory mechanisms and allowed for a more neutral position by end of session. Patient educated next session will be testing session.   Pt will benefit from skilled PT to address Tspine and R shoulder pain to allow improved QoL and return to PLOF.    OBJECTIVE IMPAIRMENTS: decreased strength, hypomobility, impaired perceived functional ability, improper body mechanics, postural dysfunction, and pain.   ACTIVITY LIMITATIONS: carrying, lifting, squatting, bed mobility, reach over head, hygiene/grooming, and caring for others  PARTICIPATION LIMITATIONS: meal prep, cleaning, laundry, community activity, occupation, and yard work  PERSONAL FACTORS: Fitness and Time since onset of injury/illness/exacerbation are also affecting patient's functional outcome.   REHAB POTENTIAL: Excellent  CLINICAL DECISION MAKING: Stable/uncomplicated  EVALUATION COMPLEXITY: Low   GOALS: Goals reviewed with patient? Yes  SHORT TERM GOALS: Target date: 09/26/2023   Patient will be independent in home exercise program to improve strength/mobility for better functional independence with ADLs. Baseline: initiated 4/4 5/7: compliant Goal status: MET   LONG TERM GOALS: Target date: 10/24/2023    Patient will increase mod ODI  score to equal to or greater than 5 points   to demonstrate statistically significant improvement in mobility and quality of life.  Baseline: to be completed 5/7: 18% Goal status: Partially Met  2.  Patient will tolerate sitting at work for >2 hours without pain and improved posture  Baseline: pain after 1 hour, with severe pain at 4 hours. 5/7: tries to not sit for as long anymore, can sit for longer without pain increase but 2 hours is max  Goal status: Partially Met  3.  Patient will demonstrate ability to wash dishes without pain to improve function at home  Baseline: moderate  pain while standing at sink 5/7: moderate pain Goal status: ongoing  4.  Patient will return to running without pain to return to PLOF. Baseline: Pt unable to run since recent surgery  5/7: progressing, has less pain with increased running duration.  Goal status: INITIAL  5.  Patient will increase BUE strength to grossly 4+/5 to 5/5 to allow lifting up child at home without pain  Baseline: see UE MMT, with pain in abdomin 5/7: grossly 4+/5 RUE; LUE 5/5 with extension 4+/5  Goal status: partially met   PLAN:  PT FREQUENCY: 1-2x/week  PT DURATION: 8 weeks  PLANNED INTERVENTIONS: 97110-Therapeutic exercises, 97530- Therapeutic activity, 97112- Neuromuscular re-education, 97535- Self Care, 04540- Manual therapy, [id number]- Aquatic Therapy, J8119- Electrical stimulation (unattended), [id number]- Electrical stimulation (manual), [id number]- Vasopneumatic device, [id number]- Ultrasound, [id number]- Traction (mechanical), [id number]- Ionotophoresis 4mg /ml Dexamethasone , Patient/Family education, Balance training, Stair training, Taping, Dry Needling, Joint mobilization, Joint manipulation, Spinal manipulation, Spinal mobilization, Cryotherapy, and Moist heat.  PLAN FOR NEXT SESSION:   RECERT   Yesli Vanderhoff, PT 10/21/2023, 8:32 AM

## 2023-10-17 ENCOUNTER — Encounter: Admitting: Physical Therapy

## 2023-10-21 ENCOUNTER — Ambulatory Visit

## 2023-10-21 DIAGNOSIS — R29898 Other symptoms and signs involving the musculoskeletal system: Secondary | ICD-10-CM

## 2023-10-21 DIAGNOSIS — M546 Pain in thoracic spine: Secondary | ICD-10-CM

## 2023-10-21 DIAGNOSIS — G8929 Other chronic pain: Secondary | ICD-10-CM

## 2023-10-22 NOTE — Therapy (Signed)
 OUTPATIENT PHYSICAL THERAPY THORACOLUMBAR TREATMENT/ RECERT **    Patient Name: Kimberly Delgado MRN: [id number] DOB:20-Feb-1986, 38 y.o., female Today's Date: 10/22/2023  END OF SESSION:               Past Medical History:  Diagnosis Date   AMA (advanced maternal age) multigravida 35+    Anxiety    Depression    Exercise-induced asthma    per pt no inhaler   Hernia, umbilical    History of pyelonephritis    Incompetent cervix    12-23-2018  first trimester   PCOS (polycystic ovarian syndrome)    Seasonal allergies    Wears contact lenses    Past Surgical History:  Procedure Laterality Date   ABDOMINAL CERCLAGE N/A 12/25/2018   Procedure: TRANS CERCLAGE ABDOMINAL;  Surgeon: Yalcinkaya, Tamer, MD;  Location: [facility];  Service: Gynecology;  Laterality: N/A;   CERVICAL CERCLAGE N/A 02/17/2015   Procedure: CERCLAGE CERVICAL;  Surgeon: Ivery Marking, MD;  Location: [facility];  Service: Gynecology;  Laterality: N/A;  EDD: 08/16/15   CESAREAN SECTION WITH BILATERAL TUBAL LIGATION Bilateral 07/09/2019   Procedure: Primary CESAREAN SECTION WITH BILATERAL TUBAL LIGATION;  Surgeon: Ivery Marking, MD;  Location: [facility];  Service: Obstetrics;  Laterality: Bilateral;  EDD: 07/14/19 Tracey RNFA   DILATATION & CURETTAGE/HYSTEROSCOPY WITH MYOSURE N/A 05/03/2022   Procedure: DILATATION & CURETTAGE/HYSTEROSCOPY WITH MYOSURE/NOVASURE ABLATION;  Surgeon: Ivery Marking, MD;  Location: [facility];  Service: Gynecology;  Laterality: N/A;   WISDOM TOOTH EXTRACTION     Patient Active Problem List   Diagnosis Date Noted   Request for sterilization 07/09/2019   Postpartum care following cesarean delivery 07/09/2019   SVD (spontaneous vaginal delivery) 08/12/2015   Postpartum care following vaginal delivery (3/18) 08/12/2015   Active labor at term 08/11/2015   Pregnancy 05/14/2015   Cervical incompetence @ 23 weeks 02/26/2013    Threatened preterm labor, antepartum 02/26/2013    PCP: Arva Lathe, MD   REFERRING PROVIDER: Arva Lathe, MD   REFERRING DIAG:  Diagnosis  M62.830 (ICD-10-CM) - Paraspinal muscle spasm    Rationale for Evaluation and Treatment: Rehabilitation  THERAPY DIAG:  No diagnosis found.  ONSET DATE: >3 years   SUBJECTIVE:                                                                                                                                                                                           SUBJECTIVE STATEMENT: ***  PERTINENT HISTORY:  Recent Umbilical hernia repair surgery. Will be on precautions until at least 4/15. Pt states that 3 years ago, she reports lifting  car seat out of car. Felt like she pulled a muscle and has been hurting off and on since injury. Reports that core strengthening will help pain, but if she doesn't run or perform exercises for a couple days the pain becomes significant at night. Pain is worse after work and after washing dishes at sink in flexed posture.  Pt is an avid runner. Next 1/2 marathon will be in September in [place]    PAIN:  Are you having pain? Yes: NPRS scale: 1/10  Pain location: under the R shoulder blade   Pain description: at worst. Pain is aching/throbbing  Aggravating factors: bending over for long period of time at work Relieving factors: core exercise.   PRECAUTIONS: Other: hernia surgery 8 days ago   RED FLAGS: None   WEIGHT BEARING RESTRICTIONS: no lifting >20lbs until 4/18   FALLS:  Has patient fallen in last 6 months? No  LIVING ENVIRONMENT: Lives with: lives with their family and lives with their spouse Lives in: House/apartment Stairs: Yes: Internal: 15 steps; on right going up and External: 1 steps; on right going up Has following equipment at home: None  OCCUPATION: Medical physists. In [employer]   PLOF: Independent  PATIENT GOALS: decrease back pain   NEXT MD  VISIT: 4/15  OBJECTIVE:  Note: Objective measures were completed at Evaluation unless otherwise noted.  DIAGNOSTIC FINDINGS:  DG x ray:  IMPRESSION: Subtle levoscoliosis lumbar spine at the L2-L3 apex with minimal narrowing of the L5-S1 disc space.   PATIENT SURVEYS:  Modified Oswestry to be completed    COGNITION: Overall cognitive status: Within functional limits for tasks assessed     SENSATION: WFL  MUSCLE LENGTH:  POSTURE: mild rounded shoulders.  PALPATION: Noted TP in the R side middle and lower traps as well as distal Lats and teres minor/major. Diffiultying palpating subscapularis due to lat tightness on the RLE  R scapula winging Asymetrical scapula movement with shoulder flexion and abduction   LUMBAR ROM:  Limited assessment due to recent umbilical hernia surgery  AROM eval  Flexion   Extension   Right lateral flexion   Left lateral flexion   Right rotation 25 deg   Left rotation 45 deg   (Blank rows = not tested)  LOWER EXTREMITY ROM:  WFL   Upper  EXTREMITY ROM:  Grossly WFL but noted mild asymmetry in R scapula .    Upper extremity  EXTREMITY MMT:    MMT Right eval Left eval  Shoulder extension *4 *4+  shoulder flexion *4+ *4+  shoulder abduction 4+ 4+  Shoulder adduction    Hip internal rotation 4+ 4+  Hip external rotation 4+ 4+  elbow flexion    Elbow extension    * Limited from pain abdomin  (Blank rows = not tested)  LUMBAR SPECIAL TESTS:  Recent Umbilical Hernia repair limits full assessment   GAIT: Distance walked: 60 Assistive device utilized: None Level of assistance: Complete Independence Comments: noted mild shoulder elevation bil decreased trunkal rotation    TREATMENT DATE: 10/22/2023  Physical therapy treatment session today consisted of completing assessment of goals and administration of testing as demonstrated and documented in flow sheet, treatment, and goals section of this note. Addition treatments may be  found below.   TA: Tall kneeling circles 10x each direction with 15 kg KB Bear hold 10x; 5 second holds cues for hand placement and core activation Child pose 30 seconds x 2 trials RDL with 15 lb KB 10x each LE  Quadruped reaches 10x each side (under/through and reach above and overhead) Wall pec stretch 3x30 seconds   Manual: Grade II mobilizations thoracic spine x multiple reps   Trigger Point Dry Needling  Subsequent Treatment: Instructions provided previously at initial dry needling treatment.   Patient Verbal Consent Given: Yes Education Handout Provided: Previously Provided Muscles Treated: bilateral upper trap and L quad Electrical Stimulation Performed: No Treatment Response/Outcome: excellent twitch response     PATIENT EDUCATION:  Education details: POC. TDN. Pt educated throughout session about proper posture and technique with exercises. Improved exercise technique, movement at target joints, use of target muscles after min to mod verbal, visual, tactile cues.  Person educated: Patient Education method: Explanation Education comprehension: verbalized understanding  HOME EXERCISE PROGRAM: Access Code: [email or web address] URL: [email or web address] Date: 08/29/2023 Prepared by: Aurora Lees  Exercises - Supine Shoulder External Rotation with Resistance  - 1 x daily - 5 x weekly - 3 sets - 10 reps - 3 hold - Hooklying Shoulder Y  - 1 x daily - 5 x weekly - 3 sets - 10 reps - 2 hold - Supine Isometric Shoulder Extension with Towel  - 1 x daily - 5 x weekly - 3 sets - 10 reps - 3 hold   ASSESSMENT:  CLINICAL IMPRESSION: ***   Pt will benefit from skilled PT to address Tspine and R shoulder pain to allow improved QoL and return to PLOF.    OBJECTIVE IMPAIRMENTS: decreased strength, hypomobility, impaired perceived functional ability, improper body mechanics, postural dysfunction, and pain.   ACTIVITY LIMITATIONS: carrying, lifting, squatting, bed mobility,  reach over head, hygiene/grooming, and caring for others  PARTICIPATION LIMITATIONS: meal prep, cleaning, laundry, community activity, occupation, and yard work  PERSONAL FACTORS: Fitness and Time since onset of injury/illness/exacerbation are also affecting patient's functional outcome.   REHAB POTENTIAL: Excellent  CLINICAL DECISION MAKING: Stable/uncomplicated  EVALUATION COMPLEXITY: Low   GOALS: Goals reviewed with patient? Yes  SHORT TERM GOALS: Target date: 09/26/2023   Patient will be independent in home exercise program to improve strength/mobility for better functional independence with ADLs. Baseline: initiated 4/4 5/7: compliant Goal status: MET   LONG TERM GOALS: Target date: 10/24/2023    Patient will increase mod ODI  score to equal to or greater than 5 points   to demonstrate statistically significant improvement in mobility and quality of life.  Baseline: to be completed 5/7: 18% Goal status: Partially Met  2.  Patient will tolerate sitting at work for >2 hours without pain and improved posture  Baseline: pain after 1 hour, with severe pain at 4 hours. 5/7: tries to not sit for as long anymore, can sit for longer without pain increase but 2 hours is max  Goal status: Partially Met  3.  Patient will demonstrate ability to wash dishes without pain to improve function at home  Baseline: moderate pain while standing at sink 5/7: moderate pain Goal status: ongoing  4.  Patient will return to running without pain to return to PLOF. Baseline: Pt unable to run since recent surgery  5/7: progressing, has less pain with increased running duration.  Goal status: INITIAL  5.  Patient will increase BUE strength to grossly 4+/5 to 5/5 to allow lifting up child at home without pain  Baseline: see UE MMT, with pain in abdomin 5/7: grossly 4+/5 RUE; LUE 5/5 with extension 4+/5  Goal status: partially met   PLAN:  PT FREQUENCY: 1-2x/week  PT DURATION: 8  weeks  PLANNED INTERVENTIONS: 97110-Therapeutic  exercises, 97530- Therapeutic activity, [id number]- Neuromuscular re-education, [phone]- Self Care, [id number]- Manual therapy, [phone]- Aquatic Therapy, [phone]- Electrical stimulation (unattended), [phone]- Electrical stimulation (manual), [id number]- Vasopneumatic device, [id number]- Ultrasound, [id number]- Traction (mechanical), [id number]- Ionotophoresis 4mg /ml Dexamethasone , Patient/Family education, Balance training, Stair training, Taping, Dry Needling, Joint mobilization, Joint manipulation, Spinal manipulation, Spinal mobilization, Cryotherapy, and Moist heat.  PLAN FOR NEXT SESSION:   Functional strength return to sport    Ruchi Stoney, PT 10/22/2023, 4:57 PM

## 2023-10-23 ENCOUNTER — Ambulatory Visit

## 2023-10-23 DIAGNOSIS — M546 Pain in thoracic spine: Secondary | ICD-10-CM | POA: Diagnosis not present

## 2023-10-23 DIAGNOSIS — R29898 Other symptoms and signs involving the musculoskeletal system: Secondary | ICD-10-CM

## 2023-10-23 DIAGNOSIS — G8929 Other chronic pain: Secondary | ICD-10-CM

## 2023-10-28 ENCOUNTER — Ambulatory Visit

## 2023-10-30 ENCOUNTER — Ambulatory Visit

## 2023-11-04 ENCOUNTER — Ambulatory Visit

## 2023-11-05 NOTE — Therapy (Signed)
 OUTPATIENT PHYSICAL THERAPY THORACOLUMBAR TREATMENT    Patient Name: Kimberly Delgado MRN: [id number] DOB:09-07-1985, 38 y.o., female Today's Date: 11/06/2023  END OF SESSION:  PT End of Session - 11/06/23 0716     Visit Number 17    Number of Visits 19    Date for PT Re-Evaluation 01/15/24    Authorization Type 1 time a month for 3 weeks.    PT Start Time 0715    PT Stop Time 0759    PT Time Calculation (min) 44 min    Activity Tolerance Patient tolerated treatment well    Behavior During Therapy WFL for tasks assessed/performed                       Past Medical History:  Diagnosis Date   AMA (advanced maternal age) multigravida 35+    Anxiety    Depression    Exercise-induced asthma    per pt no inhaler   Hernia, umbilical    History of pyelonephritis    Incompetent cervix    12-23-2018  first trimester   PCOS (polycystic ovarian syndrome)    Seasonal allergies    Wears contact lenses    Past Surgical History:  Procedure Laterality Date   ABDOMINAL CERCLAGE N/A 12/25/2018   Procedure: TRANS CERCLAGE ABDOMINAL;  Surgeon: Yalcinkaya, Tamer, MD;  Location: [facility];  Service: Gynecology;  Laterality: N/A;   CERVICAL CERCLAGE N/A 02/17/2015   Procedure: CERCLAGE CERVICAL;  Surgeon: Ivery Marking, MD;  Location: [facility];  Service: Gynecology;  Laterality: N/A;  EDD: 08/16/15   CESAREAN SECTION WITH BILATERAL TUBAL LIGATION Bilateral 07/09/2019   Procedure: Primary CESAREAN SECTION WITH BILATERAL TUBAL LIGATION;  Surgeon: Ivery Marking, MD;  Location: [facility];  Service: Obstetrics;  Laterality: Bilateral;  EDD: 07/14/19 Tracey RNFA   DILATATION & CURETTAGE/HYSTEROSCOPY WITH MYOSURE N/A 05/03/2022   Procedure: DILATATION & CURETTAGE/HYSTEROSCOPY WITH MYOSURE/NOVASURE ABLATION;  Surgeon: Ivery Marking, MD;  [facility];  Service: Gynecology;  Laterality: N/A;   WISDOM TOOTH EXTRACTION      Patient Active Problem List   Diagnosis Date Noted   Request for sterilization 07/09/2019   Postpartum care following cesarean delivery 07/09/2019   SVD (spontaneous vaginal delivery) 08/12/2015   Postpartum care following vaginal delivery (3/18) 08/12/2015   Active labor at term 08/11/2015   Pregnancy 05/14/2015   Cervical incompetence @ 23 weeks 02/26/2013   Threatened preterm labor, antepartum 02/26/2013    PCP: Arva Lathe, MD   REFERRING PROVIDER: Arva Lathe, MD   REFERRING DIAG:  Diagnosis  M62.830 (ICD-10-CM) - Paraspinal muscle spasm    Rationale for Evaluation and Treatment: Rehabilitation  THERAPY DIAG:  Pain in thoracic spine  Chronic right shoulder pain  Shoulder weakness  ONSET DATE: >3 years   SUBJECTIVE:  SUBJECTIVE STATEMENT: Patient has started going to a deep tissue massage therapist which has been helpful. L quad and hip continue to affect her with running.   PERTINENT HISTORY:  Recent Umbilical hernia repair surgery. Will be on precautions until at least 4/15. Pt states that 3 years ago, she reports lifting car seat out of car. Felt like she pulled a muscle and has been hurting off and on since injury. Reports that core strengthening will help pain, but if she doesn't run or perform exercises for a couple days the pain becomes significant at night. Pain is worse after work and after washing dishes at sink in flexed posture.  Pt is an avid runner. Next 1/2 marathon will be in September in [place]    PAIN:  Are you having pain? Yes: NPRS scale: 1/10  Pain location: under the R shoulder blade   Pain description: at worst. Pain is aching/throbbing  Aggravating factors: bending over for long period of time at work Relieving factors: core  exercise.   PRECAUTIONS: Other: hernia surgery 8 days ago   RED FLAGS: None   WEIGHT BEARING RESTRICTIONS: no lifting >20lbs until 4/18   FALLS:  Has patient fallen in last 6 months? No  LIVING ENVIRONMENT: Lives with: lives with their family and lives with their spouse Lives in: House/apartment Stairs: Yes: Internal: 15 steps; on right going up and External: 1 steps; on right going up Has following equipment at home: None  OCCUPATION: Medical physists. In [employer]   PLOF: Independent  PATIENT GOALS: decrease back pain   NEXT MD VISIT: 4/15  OBJECTIVE:  Note: Objective measures were completed at Evaluation unless otherwise noted.  DIAGNOSTIC FINDINGS:  DG x ray:  IMPRESSION: Subtle levoscoliosis lumbar spine at the L2-L3 apex with minimal narrowing of the L5-S1 disc space.   PATIENT SURVEYS:  Modified Oswestry to be completed    COGNITION: Overall cognitive status: Within functional limits for tasks assessed     SENSATION: WFL  MUSCLE LENGTH:  POSTURE: mild rounded shoulders.  PALPATION: Noted TP in the R side middle and lower traps as well as distal Lats and teres minor/major. Diffiultying palpating subscapularis due to lat tightness on the RLE  R scapula winging Asymetrical scapula movement with shoulder flexion and abduction   LUMBAR ROM:  Limited assessment due to recent umbilical hernia surgery  AROM eval  Flexion   Extension   Right lateral flexion   Left lateral flexion   Right rotation 25 deg   Left rotation 45 deg   (Blank rows = not tested)  LOWER EXTREMITY ROM:  WFL   Upper  EXTREMITY ROM:  Grossly WFL but noted mild asymmetry in R scapula .    Upper extremity  EXTREMITY MMT:    MMT Right eval Left eval  Shoulder extension *4 *4+  shoulder flexion *4+ *4+  shoulder abduction 4+ 4+  Shoulder adduction    Hip internal rotation 4+ 4+  Hip external rotation 4+ 4+  elbow flexion    Elbow extension    * Limited from  pain abdomin  (Blank rows = not tested)  LUMBAR SPECIAL TESTS:  Recent Umbilical Hernia repair limits full assessment   GAIT: Distance walked: 60 Assistive device utilized: None Level of assistance: Complete Independence Comments: noted mild shoulder elevation bil decreased trunkal rotation    TREATMENT DATE: 11/06/2023    R arm stabilizers: -ball on wall ABC  Bosu ball:  -flat side up: plank 30 seconds x 2 -flat side  up: wide grip pushups on knees 10x  Lat stretch on doorway 45 seconds each side   L hip stabilization: Bosu ball : flat side up  -Single limb stance 30 seconds  -3 way hip hike: hold on if needed; 5x each side  -mini squats 5x;   Free hand: painful to R arm:  -scapula release along scapular spine with ischemic trigger point release     PATIENT EDUCATION:  Education details: POC. TDN. Pt educated throughout session about proper posture and technique with exercises. Improved exercise technique, movement at target joints, use of target muscles after min to mod verbal, visual, tactile cues.  Person educated: Patient Education method: Explanation Education comprehension: verbalized understanding  HOME EXERCISE PROGRAM: Access Code: [email or web address] URL: [email or web address] Date: 08/29/2023 Prepared by: Aurora Lees  Exercises - Supine Shoulder External Rotation with Resistance  - 1 x daily - 5 x weekly - 3 sets - 10 reps - 3 hold - Hooklying Shoulder Y  - 1 x daily - 5 x weekly - 3 sets - 10 reps - 2 hold - Supine Isometric Shoulder Extension with Towel  - 1 x daily - 5 x weekly - 3 sets - 10 reps - 3 hold   ASSESSMENT:  CLINICAL IMPRESSION: Education on stabilization exercises for R shoulder and L hip tolerated well with patient educated on performance during the week to decrease pain and improve function. Education on dead hang positioning and reason for pain performed. Patient to be seen in another month to re-assess home programing.  Pt  will benefit from skilled PT to address Tspine and R shoulder pain to allow improved QoL and return to PLOF.    OBJECTIVE IMPAIRMENTS: decreased strength, hypomobility, impaired perceived functional ability, improper body mechanics, postural dysfunction, and pain.   ACTIVITY LIMITATIONS: carrying, lifting, squatting, bed mobility, reach over head, hygiene/grooming, and caring for others  PARTICIPATION LIMITATIONS: meal prep, cleaning, laundry, community activity, occupation, and yard work  PERSONAL FACTORS: Fitness and Time since onset of injury/illness/exacerbation are also affecting patient's functional outcome.   REHAB POTENTIAL: Excellent  CLINICAL DECISION MAKING: Stable/uncomplicated  EVALUATION COMPLEXITY: Low   GOALS: Goals reviewed with patient? Yes  SHORT TERM GOALS: Target date: 09/26/2023   Patient will be independent in home exercise program to improve strength/mobility for better functional independence with ADLs. Baseline: initiated 4/4 5/7: compliant Goal status: MET   LONG TERM GOALS: Target date: 01/15/2024    Patient will increase mod ODI  score to equal to or greater than 5 points   to demonstrate statistically significant improvement in mobility and quality of life.  Baseline: to be completed 5/7: 18% 5/29: 16% Goal status: Partially Met  2.  Patient will tolerate sitting at work for >2 hours without pain and improved posture  Baseline: pain after 1 hour, with severe pain at 4 hours. 5/7: tries to not sit for as long anymore, can sit for longer without pain increase but 2 hours is max 5/29:  able to tolerate 2 hours but does have slight increase pain.  Goal status: Partially Met  3.  Patient will demonstrate ability to wash dishes without pain to improve function at home  Baseline: moderate pain while standing at sink 5/7: moderate pain 5/29: able to perform core engagement for mild pain Goal status: Partially Met  4.  Patient will return to running  without pain to return to PLOF. Baseline: Pt unable to run since recent surgery  5/7: progressing, has less pain with increased running duration.  5/29: progressing, has decreased back pain with exercising  Goal status: Partially Met  5.  Patient will increase BUE strength to grossly 4+/5 to 5/5 to allow lifting up child at home without pain  Baseline: see UE MMT, with pain in abdomin 5/7: grossly 4+/5 RUE; LUE 5/5 with extension 4+/5  Goal status: MET   PLAN:  PT FREQUENCY: 1x/month  PT DURATION: 12 weeks  PLANNED INTERVENTIONS: 97110-Therapeutic exercises, 97530- Therapeutic activity, 97112- Neuromuscular re-education, 97535- Self Care, [id number]- Manual therapy, [id number]- Aquatic Therapy, U9811- Electrical stimulation (unattended), [id number]- Electrical stimulation (manual), [id number]- Vasopneumatic device, [id number]- Ultrasound, [id number]- Traction (mechanical), [id number]- Ionotophoresis 4mg /ml Dexamethasone , Patient/Family education, Balance training, Stair training, Taping, Dry Needling, Joint mobilization, Joint manipulation, Spinal manipulation, Spinal mobilization, Cryotherapy, and Moist heat.  PLAN FOR NEXT SESSION:   Functional strength return to sport    Crystalyn Delia, PT 11/06/2023, 8:49 AM

## 2023-11-06 ENCOUNTER — Ambulatory Visit: Attending: Internal Medicine

## 2023-11-06 DIAGNOSIS — G8929 Other chronic pain: Secondary | ICD-10-CM | POA: Diagnosis present

## 2023-11-06 DIAGNOSIS — M546 Pain in thoracic spine: Secondary | ICD-10-CM | POA: Insufficient documentation

## 2023-11-06 DIAGNOSIS — R29898 Other symptoms and signs involving the musculoskeletal system: Secondary | ICD-10-CM | POA: Diagnosis present

## 2023-11-06 DIAGNOSIS — M25511 Pain in right shoulder: Secondary | ICD-10-CM | POA: Insufficient documentation

## 2023-12-08 NOTE — Therapy (Signed)
 OUTPATIENT PHYSICAL THERAPY THORACOLUMBAR TREATMENT    Patient Name: Kimberly Delgado MRN: [id number] DOB:May 30, 1985, 38 y.o., female Today's Date: 12/09/2023  END OF SESSION:  PT End of Session - 12/09/23 0718     Visit Number 18    Number of Visits 19    Date for PT Re-Evaluation 01/15/24    Authorization Type 1 time a month for 3 weeks.    PT Start Time 0718    PT Stop Time 0758    PT Time Calculation (min) 40 min    Activity Tolerance Patient tolerated treatment well    Behavior During Therapy WFL for tasks assessed/performed                        Past Medical History:  Diagnosis Date   AMA (advanced maternal age) multigravida 35+    Anxiety    Depression    Exercise-induced asthma    per pt no inhaler   Hernia, umbilical    History of pyelonephritis    Incompetent cervix    12-23-2018  first trimester   PCOS (polycystic ovarian syndrome)    Seasonal allergies    Wears contact lenses    Past Surgical History:  Procedure Laterality Date   ABDOMINAL CERCLAGE N/A 12/25/2018   Procedure: TRANS CERCLAGE ABDOMINAL;  Surgeon: Yalcinkaya, Tamer, MD;  Location: [facility];  Service: Gynecology;  Laterality: N/A;   CERVICAL CERCLAGE N/A 02/17/2015   Procedure: CERCLAGE CERVICAL;  Surgeon: Dickie Carder, MD;  Location: [facility];  Service: Gynecology;  Laterality: N/A;  EDD: 08/16/15   CESAREAN SECTION WITH BILATERAL TUBAL LIGATION Bilateral 07/09/2019   Procedure: Primary CESAREAN SECTION WITH BILATERAL TUBAL LIGATION;  Surgeon: Carder Dickie, MD;  Location: [facility];  Service: Obstetrics;  Laterality: Bilateral;  EDD: 07/14/19 Tracey RNFA   DILATATION & CURETTAGE/HYSTEROSCOPY WITH MYOSURE N/A 05/03/2022   Procedure: DILATATION & CURETTAGE/HYSTEROSCOPY WITH MYOSURE/NOVASURE ABLATION;  Surgeon: Carder Dickie, MD;  Location: [facility];  Service: Gynecology;  Laterality: N/A;   WISDOM TOOTH EXTRACTION      Patient Active Problem List   Diagnosis Date Noted   Request for sterilization 07/09/2019   Postpartum care following cesarean delivery 07/09/2019   SVD (spontaneous vaginal delivery) 08/12/2015   Postpartum care following vaginal delivery (3/18) 08/12/2015   Active labor at term 08/11/2015   Pregnancy 05/14/2015   Cervical incompetence @ 23 weeks 02/26/2013   Threatened preterm labor, antepartum 02/26/2013    PCP: Elliot Charm, MD   REFERRING PROVIDER: Elliot Charm, MD   REFERRING DIAG:  Diagnosis  M62.830 (ICD-10-CM) - Paraspinal muscle spasm    Rationale for Evaluation and Treatment: Rehabilitation  THERAPY DIAG:  Pain in thoracic spine  Chronic right shoulder pain  Shoulder weakness  ONSET DATE: >3 years   SUBJECTIVE:  SUBJECTIVE STATEMENT: Patient reports she still has some issues in L quad and L hip tension with running. Her midback is still a little sore but able to be self corrected.   PERTINENT HISTORY:  Recent Umbilical hernia repair surgery. Will be on precautions until at least 4/15. Pt states that 3 years ago, she reports lifting car seat out of car. Felt like she pulled a muscle and has been hurting off and on since injury. Reports that core strengthening will help pain, but if she doesn't run or perform exercises for a couple days the pain becomes significant at night. Pain is worse after work and after washing dishes at sink in flexed posture.  Pt is an avid runner. Next 1/2 marathon will be in September in [place]    PAIN:  Are you having pain? Yes: NPRS scale: 1/10  Pain location: under the R shoulder blade   Pain description: at worst. Pain is aching/throbbing  Aggravating factors: bending over for long period of time at work Relieving  factors: core exercise.   PRECAUTIONS: Other: hernia surgery 8 days ago   RED FLAGS: None   WEIGHT BEARING RESTRICTIONS: no lifting >20lbs until 4/18   FALLS:  Has patient fallen in last 6 months? 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  DIAGNOSTIC FINDINGS:  DG x ray:  IMPRESSION: Subtle levoscoliosis lumbar spine at the L2-L3 apex with minimal narrowing of the L5-S1 disc space.   PATIENT SURVEYS:  Modified Oswestry to be completed    COGNITION: Overall cognitive status: Within functional limits for tasks assessed     SENSATION: WFL  MUSCLE LENGTH:  POSTURE: mild rounded shoulders.  PALPATION: Noted TP in the R side middle and lower traps as well as distal Lats and teres minor/major. Diffiultying palpating subscapularis due to lat tightness on the RLE  R scapula winging Asymetrical scapula movement with shoulder flexion and abduction   LUMBAR ROM:  Limited assessment due to recent umbilical hernia surgery  AROM eval  Flexion   Extension   Right lateral flexion   Left lateral flexion   Right rotation 25 deg   Left rotation 45 deg   (Blank rows = not tested)  LOWER EXTREMITY ROM:  WFL   Upper  EXTREMITY ROM:  Grossly WFL but noted mild asymmetry in R scapula .    Upper extremity  EXTREMITY MMT:    MMT Right eval Left eval  Shoulder extension *4 *4+  shoulder flexion *4+ *4+  shoulder abduction 4+ 4+  Shoulder adduction    Hip internal rotation 4+ 4+  Hip external rotation 4+ 4+  elbow flexion    Elbow extension    *  Limited from pain abdomin  (Blank rows = not tested)  LUMBAR SPECIAL TESTS:  Recent Umbilical Hernia repair limits full assessment   GAIT: Distance walked: 60 Assistive device utilized: None Level of assistance: Complete Independence Comments: noted mild shoulder elevation bil decreased trunkal rotation    TREATMENT DATE: 12/09/2023  L iliopsoas self release with dumbbell L hip flexion Straight leg raise with long sit position 10x; 2  sets L arc rainbow straight leg raise 10x each LE  Frogger TrA leg drops 10x; (face up) Frogger face down on weight bench : heels together squeeze glutes 10x Sidelying: top leg on step; raise bottom leg up: attempt to 10x; if not able to do hold 30 seconds Hold weighted ball overhead, keep shoulders down and contracted in good posture position; march 10x; Hold squat on floor position(deep squat) 1 minute a day        PATIENT EDUCATION:  Education details: POC. TDN. Pt educated throughout session about proper posture and technique with exercises. Improved exercise technique, movement at target joints, use of target muscles after min to mod verbal, visual, tactile cues.  Person educated: Patient Education method: Explanation Education comprehension: verbalized understanding  HOME EXERCISE PROGRAM: Access Code: [email or web address] URL: [email or web address] Date: 08/29/2023 Prepared by: Massie Dollar  Exercises - Supine Shoulder External Rotation with Resistance  - 1 x daily - 5 x weekly - 3 sets - 10 reps - 3 hold - Hooklying Shoulder Y  - 1 x daily - 5 x weekly - 3 sets - 10 reps - 2 hold - Supine Isometric Shoulder Extension with Towel  - 1 x daily - 5 x weekly - 3 sets - 10 reps - 3 hold   ASSESSMENT:  CLINICAL IMPRESSION: Patient is aware next session will be discharge. She is independent with home programs at this time. Will use last session for final assessment and progression of home program.  Patient to be seen in another month  to re-assess home programing.  Pt will benefit from skilled PT to address Tspine and R shoulder pain to allow improved QoL and return to PLOF.    OBJECTIVE IMPAIRMENTS: decreased strength, hypomobility, impaired perceived functional ability, improper body mechanics, postural dysfunction, and pain.   ACTIVITY LIMITATIONS: carrying, lifting, squatting, bed mobility, reach over head, hygiene/grooming, and caring for others  PARTICIPATION LIMITATIONS: meal prep, cleaning, laundry, community activity, occupation, and yard work  PERSONAL FACTORS: Fitness and Time since onset of injury/illness/exacerbation are also affecting patient's functional outcome.   REHAB POTENTIAL: Excellent  CLINICAL DECISION MAKING: Stable/uncomplicated  EVALUATION COMPLEXITY: Low   GOALS: Goals reviewed with patient? Yes  SHORT TERM GOALS: Target date: 09/26/2023   Patient will be independent in home exercise program to improve strength/mobility for better functional independence with ADLs. Baseline: initiated 4/4 5/7: compliant Goal status: MET   LONG TERM GOALS: Target date: 01/15/2024    Patient will increase mod ODI  score to equal to or greater than 5 points   to demonstrate statistically significant improvement in mobility and quality of life.  Baseline: to be completed 5/7: 18% 5/29: 16% Goal status: Partially Met  2.  Patient will tolerate sitting at work for >2 hours without pain and improved posture  Baseline: pain after 1 hour, with severe pain at 4 hours. 5/7: tries to not sit for as long anymore, can sit for longer without pain increase but 2 hours is max 5/29:  able to tolerate 2 hours but does have slight increase pain.  Goal status: Partially Met  3.  Patient will demonstrate ability to wash dishes without pain to improve function at home  Baseline: moderate pain while standing at sink 5/7: moderate pain 5/29: able to perform core engagement for mild pain Goal status: Partially Met  4.   Patient will return to running without pain to return to PLOF. Baseline: Pt unable to run since recent surgery  5/7: progressing, has less pain  with increased running duration. 5/29: progressing, has decreased back pain with exercising  Goal status: Partially Met  5.  Patient will increase BUE strength to grossly 4+/5 to 5/5 to allow lifting up child at home without pain  Baseline: see UE MMT, with pain in abdomin 5/7: grossly 4+/5 RUE; LUE 5/5 with extension 4+/5  Goal status: MET   PLAN:  PT FREQUENCY: 1x/month  PT DURATION: 12 weeks  PLANNED INTERVENTIONS: 97110-Therapeutic exercises, 97530- Therapeutic activity, 97112- Neuromuscular re-education, 97535- Self Care, [id number]- Manual therapy, [id number]- Aquatic Therapy, H9716- Electrical stimulation (unattended), [id number]- Electrical stimulation (manual), [id number]- Vasopneumatic device, [id number]- Ultrasound, [id number]- Traction (mechanical), [id number]- Ionotophoresis 4mg /ml Dexamethasone , Patient/Family education, Balance training, Stair training, Taping, Dry Needling, Joint mobilization, Joint manipulation, Spinal manipulation, Spinal mobilization, Cryotherapy, and Moist heat.  PLAN FOR NEXT SESSION:   Discharge, final adjustment to HEP    Eldred Sooy, PT 12/09/2023, 8:09 AM

## 2023-12-09 ENCOUNTER — Ambulatory Visit: Attending: Internal Medicine

## 2023-12-09 DIAGNOSIS — M546 Pain in thoracic spine: Secondary | ICD-10-CM | POA: Insufficient documentation

## 2023-12-09 DIAGNOSIS — G8929 Other chronic pain: Secondary | ICD-10-CM | POA: Diagnosis present

## 2023-12-09 DIAGNOSIS — M25511 Pain in right shoulder: Secondary | ICD-10-CM | POA: Insufficient documentation

## 2023-12-09 DIAGNOSIS — R29898 Other symptoms and signs involving the musculoskeletal system: Secondary | ICD-10-CM | POA: Insufficient documentation

## 2023-12-29 ENCOUNTER — Ambulatory Visit (INDEPENDENT_AMBULATORY_CARE_PROVIDER_SITE_OTHER)

## 2023-12-29 ENCOUNTER — Ambulatory Visit (INDEPENDENT_AMBULATORY_CARE_PROVIDER_SITE_OTHER): Admitting: Sports Medicine

## 2023-12-29 VITALS — HR 74 | Ht 66.0 in | Wt 137.0 lb

## 2023-12-29 DIAGNOSIS — M25551 Pain in right hip: Secondary | ICD-10-CM

## 2023-12-29 DIAGNOSIS — M25351 Other instability, right hip: Secondary | ICD-10-CM | POA: Diagnosis not present

## 2023-12-29 NOTE — Progress Notes (Signed)
 Ben Ayala Ribble D.[facility] [address] [place] [address] Phone: [phone]   Assessment and Plan:     1. Right hip pain 2. Hip instability, right  -Acute, worsening, initial visit - Right anterior hip/groin pain concerning for labral tear based on progressive worsening of right anterior hip pain, right hip instability, patient actively training for half marathon.  Differential includes stress reaction, stress fracture, hip flexor tendinitis/tear - X-ray obtained in clinic.  My interpretation: No acute fracture or dislocation - Due to significant worsening hip pain, hip instability, unremarkable x-ray imaging, recommend further evaluation with right hip MRI with intra-articular contrast to evaluate for possible labral tear versus stress reaction/fracture - Use Tylenol  500 to 1000 mg tablets 2-3 times a day for day-to-day pain relief - Recommend altering physical activity with goal of pain-free activity.  Recommend swimming, hand bike to maintain cardiovascular health  Pertinent previous records reviewed include none  Follow Up: 5 days after MRI to review results and discuss treatment plan.  Patient's personal goal is to participate in half marathon on 02/01/2024   Subjective:   I, Kimberly Delgado, am serving as a Neurosurgeon for Doctor Morene Mace  Chief Complaint: right quad pain   HPI:   12/29/23 Patient is a 38 year old female with right quad pain. Patient states pain started 2 weeks ago. Is training to run a half marathon in September 7 th she really wants to run it. Was running only able to get 8 miles and felt a twinge. Has a stinging pain when she walk. No meds for the pain. No radiating pain. No numbness or tingling. She is resting and that is making the pain worse. ROM is normal.   Relevant Historical Information: None pertinent  Additional pertinent review of systems negative.   Current Outpatient Medications:    albuterol   (VENTOLIN  HFA) 108 (90 Base) MCG/ACT inhaler, Inhale 1-2 puffs into the lungs every 6 (six) hours as needed for wheezing or shortness of breath., Disp: 18 g, Rfl: 0   amoxicillin -clavulanate (AUGMENTIN ) 875-125 MG tablet, Take 1 tablet by mouth 2 (two) times daily., Disp: , Rfl:    fluticasone (FLONASE) 50 MCG/ACT nasal spray, Place 1 spray into both nostrils daily., Disp: , Rfl:    ibuprofen  (ADVIL ) 800 MG tablet, Take 1 tablet (800 mg total) by mouth every 8 (eight) hours as needed for moderate pain or mild pain., Disp: 30 tablet, Rfl: 11   ipratropium (ATROVENT) 0.03 % nasal spray, Place into both nostrils., Disp: , Rfl:    levocetirizine (XYZAL ) 5 MG tablet, Take 1 tablet (5 mg total) by mouth every evening., Disp: 90 tablet, Rfl: 0   Multiple Vitamin (MULTIVITAMIN WITH MINERALS) TABS tablet, Take 1 tablet by mouth daily., Disp: , Rfl:    oseltamivir  (TAMIFLU ) 75 MG capsule, Take 1 capsule (75 mg total) by mouth every 12 (twelve) hours., Disp: 10 capsule, Rfl: 0   predniSONE  (DELTASONE ) 20 MG tablet, Take 2 tablets daily with breakfast., Disp: 10 tablet, Rfl: 0   promethazine -dextromethorphan (PROMETHAZINE -DM) 6.25-15 MG/5ML syrup, Take 5 mLs by mouth at bedtime as needed for cough., Disp: 100 mL, Rfl: 0   Objective:     Vitals:   12/29/23 1506  Pulse: 74  SpO2: 95%  Weight: 137 lb (62.1 kg)  Height: 5' 6 (1.676 m)      Body mass index is 22.11 kg/m.    Physical Exam:    General: awake, alert, and oriented no acute  distress, nontoxic Skin: no suspicious lesions or rashes Neuro:sensation intact distally with no deficits, normal muscle tone, no atrophy, strength 5/5 in all tested lower ext groups Psych: normal mood and affect, speech clear   Right hip: No deformity, swelling or wasting ROM Flexion 80, ext 20, IR 35, ER 40 TTP mildly hip flexors NTTP over the   greater trochanter, gluteal musculature, si joint, lumbar spine Positive log roll with FROM for anterior groin  pain Positive FABER for anterior groin pain Positive FADIR for anterior groin pain Negative Piriformis test Negative trendelenberg Gait antalgic, favoring left leg   Electronically signed by:  Odis Mace D.[facility] 3:31 PM 12/29/23

## 2023-12-29 NOTE — Patient Instructions (Addendum)
 Xrays on the way out  Right hip MRI [place]  Follow up 5 days after MRI  Tylenol  [phone] mg 2-3 times a day for pain relief

## 2024-01-01 ENCOUNTER — Ambulatory Visit: Admitting: Family Medicine

## 2024-01-06 ENCOUNTER — Ambulatory Visit: Attending: Internal Medicine

## 2024-01-08 ENCOUNTER — Ambulatory Visit: Admitting: Sports Medicine

## 2024-01-08 ENCOUNTER — Ambulatory Visit: Attending: Internal Medicine

## 2024-01-08 ENCOUNTER — Ambulatory Visit
Admission: RE | Admit: 2024-01-08 | Discharge: 2024-01-08 | Disposition: A | Discharge status: Home / Self Care | Source: Ambulatory Visit | Attending: Sports Medicine | Admitting: Sports Medicine

## 2024-01-08 DIAGNOSIS — M25351 Other instability, right hip: Secondary | ICD-10-CM

## 2024-01-08 DIAGNOSIS — M25511 Pain in right shoulder: Secondary | ICD-10-CM | POA: Diagnosis present

## 2024-01-08 DIAGNOSIS — R29898 Other symptoms and signs involving the musculoskeletal system: Secondary | ICD-10-CM | POA: Insufficient documentation

## 2024-01-08 DIAGNOSIS — M546 Pain in thoracic spine: Secondary | ICD-10-CM | POA: Insufficient documentation

## 2024-01-08 DIAGNOSIS — G8929 Other chronic pain: Secondary | ICD-10-CM | POA: Diagnosis present

## 2024-01-08 DIAGNOSIS — M25551 Pain in right hip: Secondary | ICD-10-CM

## 2024-01-08 MED ORDER — IOPAMIDOL (ISOVUE-M 200) INJECTION 41%
10.0000 mL | Freq: Once | INTRAMUSCULAR | Status: AC
  Administered 2024-01-08: 10 mL via INTRA_ARTICULAR

## 2024-01-08 NOTE — Therapy (Signed)
 OUTPATIENT PHYSICAL THERAPY THORACOLUMBAR TREATMENT    Patient Name: Kimberly Delgado MRN: [id number] DOB:February 22, 1986, 38 y.o., female Today's Date: 01/08/2024  END OF SESSION:  PT End of Session - 01/08/24 0725     Visit Number 19    Number of Visits 19    Date for PT Re-Evaluation 01/15/24    Authorization Type 1 time a month for 3 weeks.    PT Start Time 0719    PT Stop Time 0800    PT Time Calculation (min) 41 min    Activity Tolerance Patient tolerated treatment well    Behavior During Therapy WFL for tasks assessed/performed         Past Medical History:  Diagnosis Date   AMA (advanced maternal age) multigravida 35+    Anxiety    Depression    Exercise-induced asthma    per pt no inhaler   Hernia, umbilical    History of pyelonephritis    Incompetent cervix    12-23-2018  first trimester   PCOS (polycystic ovarian syndrome)    Seasonal allergies    Wears contact lenses    Past Surgical History:  Procedure Laterality Date   ABDOMINAL CERCLAGE N/A 12/25/2018   Procedure: TRANS CERCLAGE ABDOMINAL;  Surgeon: Yalcinkaya, Tamer, MD;  Location: [facility];  Service: Gynecology;  Laterality: N/A;   CERVICAL CERCLAGE N/A 02/17/2015   Procedure: CERCLAGE CERVICAL;  Surgeon: Dickie Carder, MD;  Location: [facility];  Service: Gynecology;  Laterality: N/A;  EDD: 08/16/15   CESAREAN SECTION WITH BILATERAL TUBAL LIGATION Bilateral 07/09/2019   Procedure: Primary CESAREAN SECTION WITH BILATERAL TUBAL LIGATION;  Surgeon: Carder Dickie, MD;  Location: [facility];  Service: Obstetrics;  Laterality: Bilateral;  EDD: 07/14/19 Tracey RNFA   DILATATION & CURETTAGE/HYSTEROSCOPY WITH MYOSURE N/A 05/03/2022   Procedure: DILATATION & CURETTAGE/HYSTEROSCOPY WITH MYOSURE/NOVASURE ABLATION;  Surgeon: Carder Dickie, MD;  Location: [facility];  Service: Gynecology;  Laterality: N/A;   WISDOM TOOTH EXTRACTION     Patient Active Problem List    Diagnosis Date Noted   Request for sterilization 07/09/2019   Postpartum care following cesarean delivery 07/09/2019   SVD (spontaneous vaginal delivery) 08/12/2015   Postpartum care following vaginal delivery (3/18) 08/12/2015   Active labor at term 08/11/2015   Pregnancy 05/14/2015   Cervical incompetence @ 23 weeks 02/26/2013   Threatened preterm labor, antepartum 02/26/2013    PCP: Elliot Charm, MD   REFERRING PROVIDER: Elliot Charm, MD   REFERRING DIAG:  Diagnosis  M62.830 (ICD-10-CM) - Paraspinal muscle spasm    Rationale for Evaluation and Treatment: Rehabilitation  THERAPY DIAG:  Pain in thoracic spine  Chronic right shoulder pain  Shoulder weakness  ONSET DATE: >3 years   SUBJECTIVE:  SUBJECTIVE STATEMENT:  Pt reports she is fine with being discharged at this point in time.  She does however feel as though she will return soon following the results of her MRI.  She is wanting to return to running and would like to participate in therapy to be able to do that.    PERTINENT HISTORY:  Recent Umbilical hernia repair surgery. Will be on precautions until at least 4/15. Pt states that 3 years ago, she reports lifting car seat out of car. Felt like she pulled a muscle and has been hurting off and on since injury. Reports that core strengthening will help pain, but if she doesn't run or perform exercises for a couple days the pain becomes significant at night. Pain is worse after work and after washing dishes at sink in flexed posture.  Pt is an avid runner. Next 1/2 marathon will be in September in [place]    PAIN:  Are you having pain? Yes: NPRS scale: 1/10  Pain location: under the R shoulder blade   Pain description: at worst. Pain is aching/throbbing   Aggravating factors: bending over for long period of time at work Relieving factors: core exercise.   PRECAUTIONS: Other: hernia surgery 8 days ago   RED FLAGS: None   WEIGHT BEARING RESTRICTIONS: no lifting >20lbs until 4/18   FALLS:  Has patient fallen in last 6 months? No  LIVING ENVIRONMENT: Lives with: lives with their family and lives with their spouse Lives in: House/apartment Stairs: Yes: Internal: 15 steps; on right going up and External: 1 steps; on right going up Has following equipment at home: None  OCCUPATION: Medical physists. In [employer]   PLOF: Independent  PATIENT GOALS: decrease back pain   NEXT MD VISIT: 4/15  OBJECTIVE:  Note: Objective measures were completed at Evaluation unless otherwise noted.  DIAGNOSTIC FINDINGS:  DG x ray:  IMPRESSION: Subtle levoscoliosis lumbar spine at the L2-L3 apex with minimal narrowing of the L5-S1 disc space.   PATIENT SURVEYS:  Modified Oswestry to be completed    COGNITION: Overall cognitive status: Within functional limits for tasks assessed     SENSATION: WFL  MUSCLE LENGTH:  POSTURE: mild rounded shoulders.  PALPATION: Noted TP in the R side middle and lower traps as well as distal Lats and teres minor/major. Diffiultying palpating subscapularis due to lat tightness on the RLE  R scapula winging Asymetrical scapula movement with shoulder flexion and abduction   LUMBAR ROM:  Limited assessment due to recent umbilical hernia surgery  AROM eval  Flexion   Extension   Right lateral flexion   Left lateral flexion   Right rotation 25 deg   Left rotation 45 deg   (Blank rows = not tested)  LOWER EXTREMITY ROM:  WFL   Upper  EXTREMITY ROM:  Grossly WFL but noted mild asymmetry in R scapula .    Upper extremity  EXTREMITY MMT:    MMT Right eval Left eval  Shoulder extension *4 *4+  shoulder flexion *4+ *4+  shoulder abduction 4+ 4+  Shoulder adduction    Hip internal rotation  4+ 4+  Hip external rotation 4+ 4+  elbow flexion    Elbow extension    * Limited from pain abdomin  (Blank rows = not tested)  LUMBAR SPECIAL TESTS:  Recent Umbilical Hernia repair limits full assessment   GAIT: Distance walked: 60 Assistive device utilized: None Level of assistance: Complete Independence Comments: noted mild shoulder elevation bil decreased trunkal rotation  TREATMENT DATE: 01/08/2024   Self-Care and Home Management  Pt educated in role of therapy and the progress made during the sessions over the past several months.  Pt is still limited in her running ability due to the potential labral tear that she is getting imaging for.  Pt otherwise is doing well and is ready to be discharged at this time.  Pt encouraged to continue with exercises that target the core stability and hip strengthening, more notably the gluteal region due to the weakness found there.    PATIENT EDUCATION:  Education details: POC. TDN. Pt educated throughout session about proper posture and technique with exercises. Improved exercise technique, movement at target joints, use of target muscles after min to mod verbal, visual, tactile cues.  Person educated: Patient Education method: Explanation Education comprehension: verbalized understanding  HOME EXERCISE PROGRAM: Access Code: [email or web address] URL: [email or web address] Date: 08/29/2023 Prepared by: Massie Dollar  Exercises - Supine Shoulder External Rotation with Resistance  - 1 x daily - 5 x weekly - 3 sets - 10 reps - 3 hold - Hooklying Shoulder Y  - 1 x daily - 5 x weekly - 3 sets - 10 reps - 2 hold - Supine Isometric Shoulder Extension with Towel  - 1 x daily - 5 x weekly - 3 sets - 10 reps - 3 hold   ASSESSMENT:  CLINICAL IMPRESSION:  Pt is being discharged today as she has met several of her current goals at this time.  Pt still limited by hip pain during running and will continue to pursue this as it is something  she enjoys doing.  Pt feels as though she has good enough understanding of exercises to continue with her HEP at this time.  Pt will likely be back in the near future for an assessment of her gait mechanics with a new referral.  Pt is formally discharged at this time.     OBJECTIVE IMPAIRMENTS: decreased strength, hypomobility, impaired perceived functional ability, improper body mechanics, postural dysfunction, and pain.   ACTIVITY LIMITATIONS: carrying, lifting, squatting, bed mobility, reach over head, hygiene/grooming, and caring for others  PARTICIPATION LIMITATIONS: meal prep, cleaning, laundry, community activity, occupation, and yard work  PERSONAL FACTORS: Fitness and Time since onset of injury/illness/exacerbation are also affecting patient's functional outcome.   REHAB POTENTIAL: Excellent  CLINICAL DECISION MAKING: Stable/uncomplicated  EVALUATION COMPLEXITY: Low   GOALS: Goals reviewed with patient? Yes  SHORT TERM GOALS: Target date: 09/26/2023   Patient will be independent in home exercise program to improve strength/mobility for better functional independence with ADLs. Baseline: initiated 4/4 5/7: compliant Goal status: MET   LONG TERM GOALS: Target date: 01/15/2024    Patient will increase mod ODI  score to equal to or greater than 5 points   to demonstrate statistically significant improvement in mobility and quality of life.  Baseline: to be completed 5/7: 18%  5/29: 16% Goal status: Partially Met  2.  Patient will tolerate sitting at work for >2 hours without pain and improved posture  Baseline: pain after 1 hour, with severe pain at 4 hours. 5/7: tries to not sit for as long anymore, can sit for longer without pain increase but 2 hours is max 5/29:  able to tolerate 2 hours but does have slight increase pain.  01/08/24: Pt reports she is able to sit >2 hours without pain Goal status: MET  3.  Patient will demonstrate ability to wash dishes without pain to  improve function at home  Baseline:  moderate pain while standing at sink 5/7: moderate pain 5/29: able to perform core engagement for mild pain 01/08/24: Pt reports she still has slight pain when performing dish washing activity Goal status: Partially Met  4.  Patient will return to running without pain to return to PLOF. Baseline: Pt unable to run since recent surgery  5/7: progressing, has less pain with increased running duration. 5/29: progressing, has decreased back pain with exercising  01/08/24: Pt is experiencing increased pain likely due to potential labral tear Goal status: Partially Met  5.  Patient will increase BUE strength to grossly 4+/5 to 5/5 to allow lifting up child at home without pain  Baseline: see UE MMT, with pain in abdomin 5/7: grossly 4+/5 RUE; LUE 5/5 with extension 4+/5   Goal status: MET   PLAN:  PT FREQUENCY: 1x/month  PT DURATION: 12 weeks  PLANNED INTERVENTIONS: 97110-Therapeutic exercises, 97530- Therapeutic activity, 97112- Neuromuscular re-education, 97535- Self Care, 02859- Manual therapy, [id number]- Aquatic Therapy, H9716- Electrical stimulation (unattended), [id number]- Electrical stimulation (manual), 97016- Vasopneumatic device, [id number]- Ultrasound, [id number]- Traction (mechanical), [id number]- Ionotophoresis 4mg /ml Dexamethasone , Patient/Family education, Balance training, Stair training, Taping, Dry Needling, Joint mobilization, Joint manipulation, Spinal manipulation, Spinal mobilization, Cryotherapy, and Moist heat.  PLAN FOR NEXT SESSION:   Discharge, final adjustment to HEP    Fonda Simpers, PT, DTaP Physical Therapist - [facility]  [facility]  01/08/24, 7:26 AM

## 2024-01-09 NOTE — Progress Notes (Unsigned)
    Ben Jackson D.[facility] [address] [place] [address] Phone: [phone]   Assessment and Plan:     There are no diagnoses linked to this encounter.  ***   Pertinent previous records reviewed include ***    Follow Up: ***     Subjective:   , Kimberly Delgado, am serving as a Neurosurgeon for Doctor Morene Mace   Chief Complaint: right quad pain    HPI:    12/29/23 Patient is a 38 year old female with right quad pain. Patient states pain started 2 weeks ago. Is training to run a half marathon in September 7 th she really wants to run it. Was running only able to get 8 miles and felt a twinge. Has a stinging pain when she walk. No meds for the pain. No radiating pain. No numbness or tingling. She is resting and that is making the pain worse. ROM is normal.   01/13/2024 Patient states   Relevant Historical Information: None pertinent  Additional pertinent review of systems negative.   Current Outpatient Medications:    albuterol  (VENTOLIN  HFA) 108 (90 Base) MCG/ACT inhaler, Inhale 1-2 puffs into the lungs every 6 (six) hours as needed for wheezing or shortness of breath., Disp: 18 g, Rfl: 0   amoxicillin -clavulanate (AUGMENTIN ) 875-125 MG tablet, Take 1 tablet by mouth 2 (two) times daily., Disp: , Rfl:    fluticasone (FLONASE) 50 MCG/ACT nasal spray, Place 1 spray into both nostrils daily., Disp: , Rfl:    ibuprofen  (ADVIL ) 800 MG tablet, Take 1 tablet (800 mg total) by mouth every 8 (eight) hours as needed for moderate pain or mild pain., Disp: 30 tablet, Rfl: 11   ipratropium (ATROVENT) 0.03 % nasal spray, Place into both nostrils., Disp: , Rfl:    levocetirizine (XYZAL ) 5 MG tablet, Take 1 tablet (5 mg total) by mouth every evening., Disp: 90 tablet, Rfl: 0   Multiple Vitamin (MULTIVITAMIN WITH MINERALS) TABS tablet, Take 1 tablet by mouth daily., Disp: , Rfl:    oseltamivir  (TAMIFLU ) 75 MG capsule, Take 1 capsule (75 mg total)  by mouth every 12 (twelve) hours., Disp: 10 capsule, Rfl: 0   predniSONE  (DELTASONE ) 20 MG tablet, Take 2 tablets daily with breakfast., Disp: 10 tablet, Rfl: 0   promethazine -dextromethorphan (PROMETHAZINE -DM) 6.25-15 MG/5ML syrup, Take 5 mLs by mouth at bedtime as needed for cough., Disp: 100 mL, Rfl: 0   Objective:     There were no vitals filed for this visit.    There is no height or weight on file to calculate BMI.    Physical Exam:    ***   Electronically signed by:  Odis Mace D.[facility] 12:00 PM 01/09/24

## 2024-01-12 ENCOUNTER — Ambulatory Visit: Payer: Self-pay | Admitting: Sports Medicine

## 2024-01-13 ENCOUNTER — Ambulatory Visit (INDEPENDENT_AMBULATORY_CARE_PROVIDER_SITE_OTHER): Admitting: Sports Medicine

## 2024-01-13 VITALS — BP 118/74 | HR 74 | Ht 64.0 in | Wt 137.0 lb

## 2024-01-13 DIAGNOSIS — S73191A Other sprain of right hip, initial encounter: Secondary | ICD-10-CM | POA: Diagnosis not present

## 2024-01-13 DIAGNOSIS — M25551 Pain in right hip: Secondary | ICD-10-CM

## 2024-01-13 NOTE — Patient Instructions (Addendum)
 Thank you for coming in today.  Follow up in 10 days for steroid injection. May walk as tolerated.

## 2024-01-22 NOTE — Progress Notes (Unsigned)
 Kimberly Kimberly Kimberly [address] [place] [address] Phone: [phone]   Assessment and Plan:     1. Right hip pain (Primary) 2. Tear of right acetabular labrum, initial encounter - Subacute, subsequent visit - Continued right hip pain consistent with anterior superior nondisplaced labral tear found on MRI 01/08/2024 - Patient elected for intra-articular hip CSI.  Tolerated well per note below - Patient has a half marathon planned for 02/01/2024 that she plans on trying to perform at a fast walking pace.  She has not been able to run due to pain - Use Tylenol  500 to 1000 mg tablets 2-3 times a day for day-to-day pain relief  Procedure: Ultrasound Guided Hip Acetabulofemoral Joint Injection Side: Right Diagnosis: Anterior superior nondisplaced labral tear US  Indication:  - accuracy is paramount for diagnosis - to ensure therapeutic efficacy or procedural success - to reduce procedural risk  After explaining the procedure, viable alternatives, risks, and answering any questions, consent was given verbally. The site was cleaned with chlorhexidine  prep. An ultrasound transducer was placed on the anterior thigh/hip.   The acetabular joint, labrum, and femoral shaft were identified.  The neurovascular structures were identified and an approach was found specifically avoiding these structures.  A steroid injection was performed under ultrasound guidance with sterile technique using 2ml of 1% lidocaine  without epinephrine  and 40 mg of triamcinolone  (KENALOG ) 40mg /ml. This was well tolerated and resulted in  relief.  Needle was removed and dressing placed and post injection instructions were given including  a discussion of likely return of pain today after the anesthetic wears off (with the possibility of worsened pain) until the steroid starts to work in 1-3 days.   Pt was advised to call or return to clinic if these symptoms worsen or fail to improve  as anticipated. Images permanently stored.     Pertinent previous records reviewed include none   Follow Up: 4 weeks for reevaluation.  If improving, could consider repeat CSI after 3 months if pain returns.  If no improvement or worsening of symptoms, would consider orthopedic surgery referral for labral tear   Subjective:   I, Kimberly Kimberly, am serving as a Neurosurgeon for Doctor Morene Mace  Chief Complaint: right quad pain    HPI:    12/29/23 Patient is a 38 year old female with right quad pain. Patient states pain started 2 weeks ago. Is training to run a half marathon in September 7 th she really wants to run it. Was running only able to get 8 miles and felt a twinge. Has a stinging pain when she walk. No meds for the pain. No radiating pain. No numbness or tingling. She is resting and that is making the pain worse. ROM is normal.    01/13/2024 Patient states she has been wearing a hip brace and has been working on hip strengthening at home. She has tried to run, but it causes increase pain.  01/23/2024 Patient states that she is the same    Relevant Historical Information: None pertinent  Additional pertinent review of systems negative.   Current Outpatient Medications:    FLUoxetine (PROZAC) 10 MG tablet, , Disp: , Rfl:    levocetirizine (XYZAL ) 5 MG tablet, Take 1 tablet (5 mg total) by mouth every evening., Disp: 90 tablet, Rfl: 0   Objective:     Vitals:   01/23/24 0819  Pulse: (!) 57  SpO2: 98%  Weight: 157 lb (71.2 kg)  Height:  5' 4 (1.626 m)      Body mass index is 26.95 kg/m.    Physical Exam:    General: awake, alert, and oriented no acute distress, nontoxic Skin: no suspicious lesions or rashes Neuro:sensation intact distally with no deficits, normal muscle tone, no atrophy, strength 5/5 in all tested lower ext groups Psych: normal mood and affect, speech clear   Right hip: No deformity, swelling or wasting ROM Flexion 80, ext 20, IR 35, ER  40 TTP mildly hip flexors NTTP over the   greater trochanter, gluteal musculature, si joint, lumbar spine Positive log roll with FROM for anterior groin pain Positive FABER for anterior groin pain Positive FADIR for anterior groin pain Negative Piriformis test Negative trendelenberg Gait antalgic, favoring left leg    Electronically signed by:  Odis Mace D.CLEMENTEEN AMYE Kimber[facility] Kimberly 8:39 AM 01/23/24

## 2024-01-23 ENCOUNTER — Ambulatory Visit: Admitting: Sports Medicine

## 2024-01-23 ENCOUNTER — Ambulatory Visit: Payer: Self-pay

## 2024-01-23 VITALS — HR 57 | Ht 64.0 in | Wt 157.0 lb

## 2024-01-23 DIAGNOSIS — S73191A Other sprain of right hip, initial encounter: Secondary | ICD-10-CM | POA: Diagnosis not present

## 2024-01-23 DIAGNOSIS — M25551 Pain in right hip: Secondary | ICD-10-CM | POA: Diagnosis not present

## 2024-02-19 NOTE — Progress Notes (Unsigned)
    Kimberly Jackson D.Delgado [facility] [address] [place] [address] Phone: [phone]   Assessment and Plan:     1. Right hip pain (Primary) 2. Tear of right acetabular labrum, initial encounter 3. Hip instability, right -Subacute, improving, subsequent visit - Overall significant improvement, 75%, and pain resulting from anterior superior nondisplaced labral tear with treatment course including HEP, activity modification, Tylenol  use, intra-articular hip CSI performed on 01/23/2024. -Continue HEP - Continue running activities as tolerated - Use Tylenol  500 to 1000 mg tablets 2-3 times a day for day-to-day pain relief - Based on improvement with conservative treatment plan, will not refer to orthopedic surgery at this time.  Pertinent previous records reviewed include none   Follow Up: As needed.  Could consider repeat intra-articular CSI if patient receives at least 3 months relief.  Could consider referral to orthopedic surgery for labral repair   Subjective:   I, Kimberly Delgado, am serving as a Neurosurgeon for Doctor Morene Mace   Chief Complaint: right quad pain    HPI:    12/29/23 Patient is a 38 year old female with right quad pain. Patient states pain started 2 weeks ago. Is training to run a half marathon in September 7 th she really wants to run it. Was running only able to get 8 miles and felt a twinge. Has a stinging pain when she walk. No meds for the pain. No radiating pain. No numbness or tingling. She is resting and that is making the pain worse. ROM is normal.    01/13/2024 Patient states she has been wearing a hip brace and has been working on hip strengthening at home. She has tried to run, but it causes increase pain.   01/23/2024 Patient states that she is the same   02/20/2024 Patient states she is good. 75% improvement in pain    Relevant Historical Information: None pertinent  Additional pertinent review of systems  negative.   Current Outpatient Medications:    FLUoxetine (PROZAC) 10 MG tablet, , Disp: , Rfl:    levocetirizine (XYZAL ) 5 MG tablet, Take 1 tablet (5 mg total) by mouth every evening., Disp: 90 tablet, Rfl: 0   Objective:     Vitals:   02/20/24 1533  BP: 110/82  Weight: 157 lb (71.2 kg)  Height: 5' 4 (1.626 m)      Body mass index is 26.95 kg/m.    Physical Exam:    General: awake, alert, and oriented no acute distress, nontoxic Skin: no suspicious lesions or rashes Neuro:sensation intact distally with no deficits, normal muscle tone, no atrophy, strength 5/5 in all tested lower ext groups Psych: normal mood and affect, speech clear   Right hip: No deformity, swelling or wasting ROM Flexion 90, ext 30, IR 35, ER 40 TTP mildly hip flexors NTTP over the   greater trochanter, gluteal musculature, si joint, lumbar spine negative log roll   Positive FABER for anterior groin pain Positive FADIR for anterior groin pain Negative Piriformis test Negative trendelenberg Gait antalgic, favoring left leg   Electronically signed by:  Odis Mace D.Delgado [facility] 3:42 PM 02/20/24

## 2024-02-20 ENCOUNTER — Ambulatory Visit: Admitting: Sports Medicine

## 2024-02-20 VITALS — BP 110/82 | Ht 64.0 in | Wt 157.0 lb

## 2024-02-20 DIAGNOSIS — M25551 Pain in right hip: Secondary | ICD-10-CM

## 2024-02-20 DIAGNOSIS — S73191A Other sprain of right hip, initial encounter: Secondary | ICD-10-CM | POA: Diagnosis not present

## 2024-02-20 DIAGNOSIS — M25351 Other instability, right hip: Secondary | ICD-10-CM | POA: Diagnosis not present
